# Patient Record
Sex: Male | Born: 1955 | Race: White | Hispanic: No | Marital: Married | Smoking: Current some day smoker
Health system: Southern US, Community
[De-identification: ages and names within clinical notes are randomized; demographics above are authoritative.]

## PROBLEM LIST (undated history)

## (undated) DIAGNOSIS — K4090 Unilateral inguinal hernia, without obstruction or gangrene, not specified as recurrent: Secondary | ICD-10-CM

## (undated) DIAGNOSIS — F329 Major depressive disorder, single episode, unspecified: Secondary | ICD-10-CM

## (undated) DIAGNOSIS — K220 Achalasia of cardia: Secondary | ICD-10-CM

## (undated) DIAGNOSIS — F419 Anxiety disorder, unspecified: Secondary | ICD-10-CM

## (undated) DIAGNOSIS — K353 Acute appendicitis with localized peritonitis, without perforation or gangrene: Secondary | ICD-10-CM

## (undated) DIAGNOSIS — F332 Major depressive disorder, recurrent severe without psychotic features: Secondary | ICD-10-CM

## (undated) DIAGNOSIS — K3532 Acute appendicitis with perforation and localized peritonitis, without abscess: Secondary | ICD-10-CM

## (undated) DIAGNOSIS — F411 Generalized anxiety disorder: Secondary | ICD-10-CM

## (undated) DIAGNOSIS — F32A Depression, unspecified: Secondary | ICD-10-CM

---

## 2000-07-03 ENCOUNTER — Encounter: Payer: Self-pay | Admitting: Emergency Medicine

## 2000-07-03 ENCOUNTER — Emergency Department (HOSPITAL_COMMUNITY): Admission: EM | Admit: 2000-07-03 | Discharge: 2000-07-03 | Payer: Self-pay | Admitting: Emergency Medicine

## 2006-03-16 ENCOUNTER — Ambulatory Visit: Payer: Self-pay | Admitting: Internal Medicine

## 2006-03-29 ENCOUNTER — Encounter (INDEPENDENT_AMBULATORY_CARE_PROVIDER_SITE_OTHER): Payer: Self-pay | Admitting: Specialist

## 2006-03-29 ENCOUNTER — Ambulatory Visit (HOSPITAL_COMMUNITY): Admission: RE | Admit: 2006-03-29 | Discharge: 2006-03-29 | Payer: Self-pay | Admitting: Internal Medicine

## 2006-03-29 ENCOUNTER — Ambulatory Visit: Payer: Self-pay | Admitting: Internal Medicine

## 2006-08-10 ENCOUNTER — Ambulatory Visit (HOSPITAL_COMMUNITY): Admission: RE | Admit: 2006-08-10 | Discharge: 2006-08-10 | Payer: Self-pay | Admitting: General Surgery

## 2006-12-14 ENCOUNTER — Ambulatory Visit (HOSPITAL_COMMUNITY): Admission: RE | Admit: 2006-12-14 | Discharge: 2006-12-14 | Payer: Self-pay | Admitting: General Surgery

## 2007-09-14 ENCOUNTER — Ambulatory Visit (HOSPITAL_COMMUNITY): Admission: RE | Admit: 2007-09-14 | Discharge: 2007-09-14 | Payer: Self-pay | Admitting: Family Medicine

## 2008-04-20 ENCOUNTER — Emergency Department (HOSPITAL_COMMUNITY): Admission: EM | Admit: 2008-04-20 | Discharge: 2008-04-20 | Payer: Self-pay | Admitting: Emergency Medicine

## 2008-04-21 ENCOUNTER — Ambulatory Visit (HOSPITAL_COMMUNITY): Admission: RE | Admit: 2008-04-21 | Discharge: 2008-04-21 | Payer: Self-pay | Admitting: Emergency Medicine

## 2009-08-11 IMAGING — CR DG ABDOMEN ACUTE W/ 1V CHEST
3 series · 3 of 3 positions shown · non-contrast
Comparison: None

CLINICAL DATA: Abdominal pain.  Right upper quadrant pain.

ACUTE ABDOMEN SERIES (ABDOMEN 2 VIEW & CHEST 1 VIEW)

[view not recorded (1 of 3)]
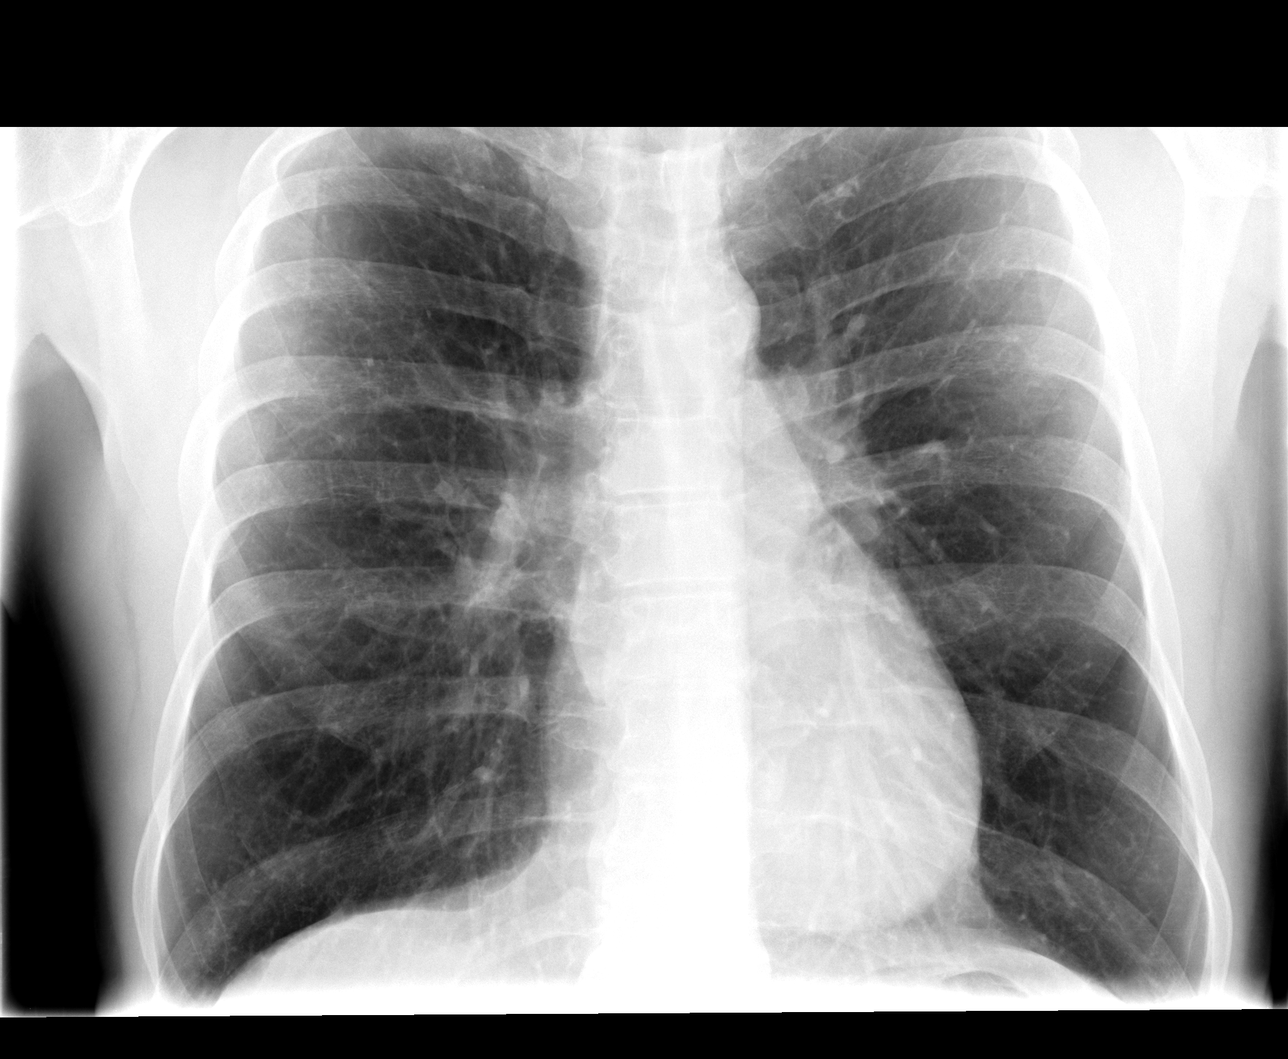

[view not recorded (2 of 3)]
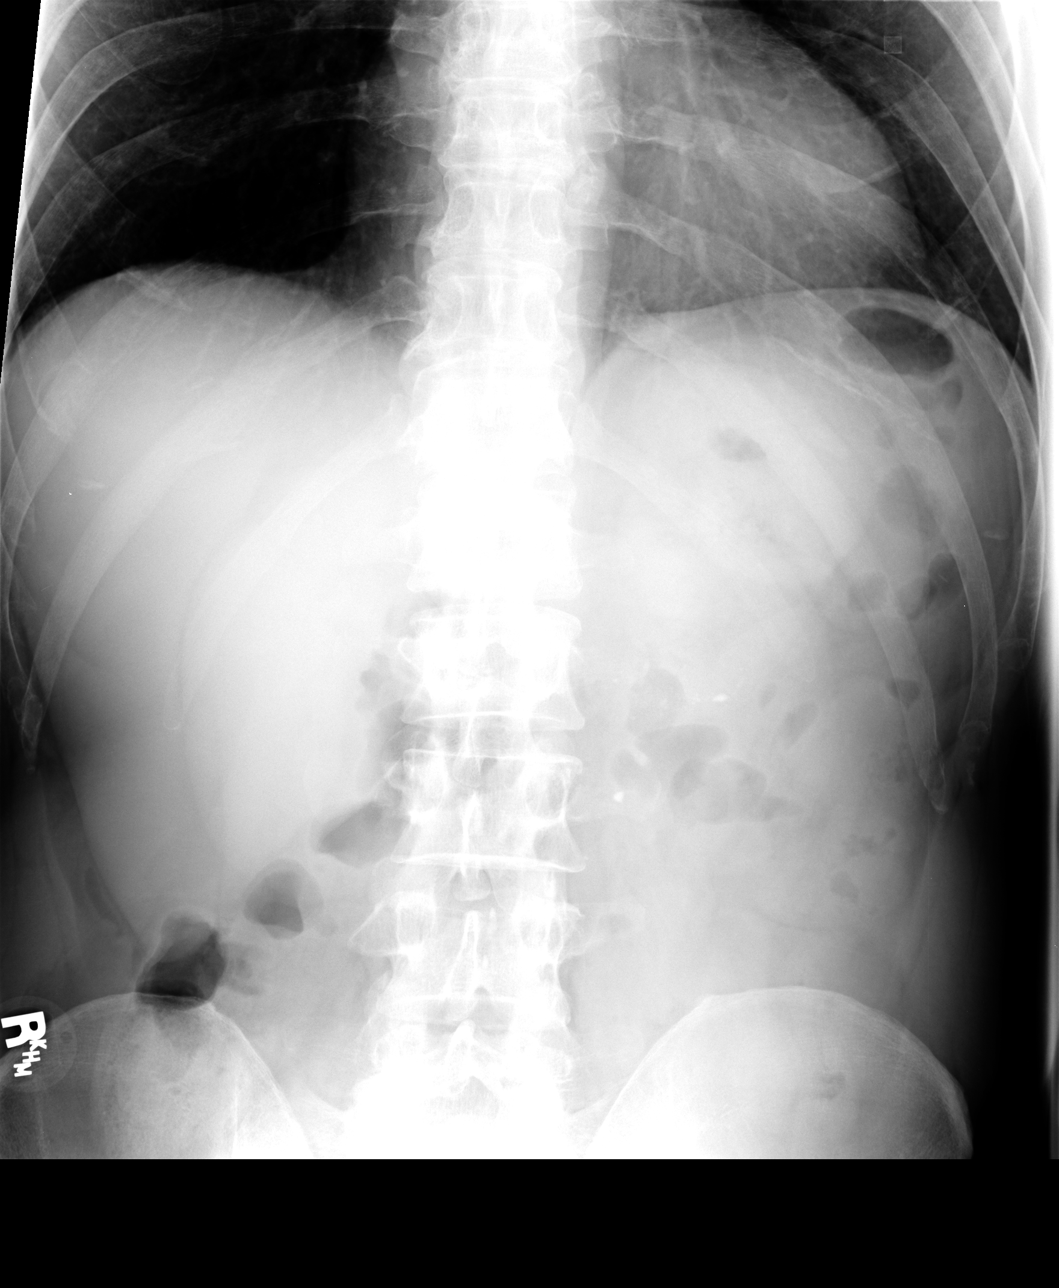

[view not recorded (3 of 3)]
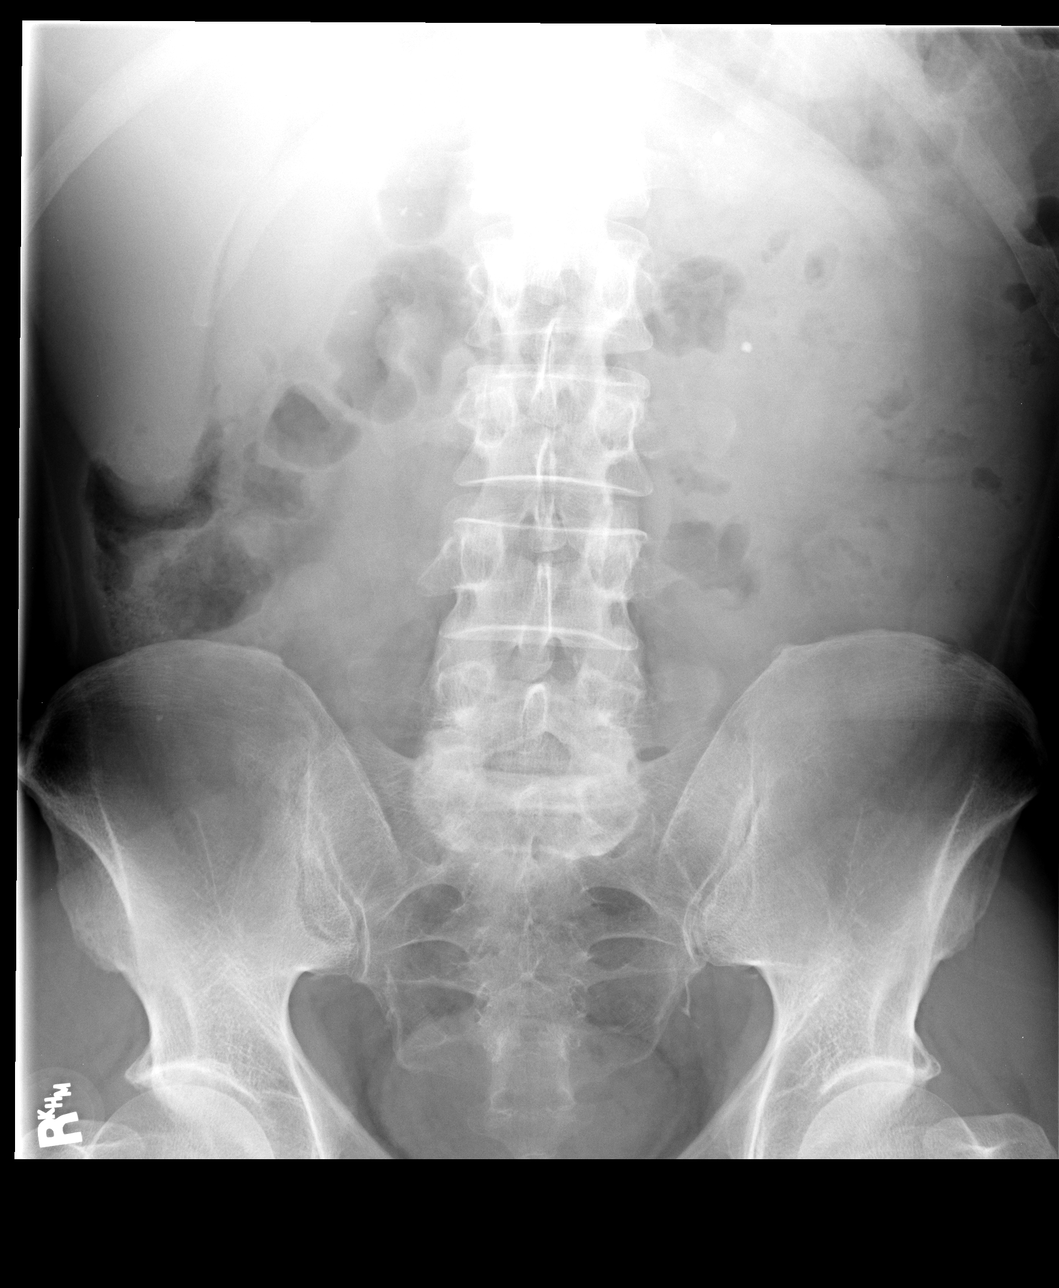

[3 of 3 positions shown; findings below may reference images not displayed]

FINDINGS: Lungs are hyperinflated with increased markings,
compatible with COPD.  There is no acute infiltrate or effusion.
There is no heart failure or mass.

Nonobstructive bowel gas pattern.  There is no free air.  There are
small renal calculi bilaterally.  No acute bony abnormality.
IMPRESSION: COPD, no active cardiopulmonary disease.

Small bilateral renal calculi.

## 2009-09-22 ENCOUNTER — Emergency Department (HOSPITAL_COMMUNITY): Admission: EM | Admit: 2009-09-22 | Discharge: 2009-09-22 | Payer: Self-pay | Admitting: Emergency Medicine

## 2010-04-23 LAB — URINALYSIS, ROUTINE W REFLEX MICROSCOPIC
Bilirubin Urine: NEGATIVE
Glucose, UA: NEGATIVE mg/dL
Hgb urine dipstick: NEGATIVE
Nitrite: NEGATIVE
Protein, ur: NEGATIVE mg/dL
Specific Gravity, Urine: 1.025 (ref 1.005–1.030)
Urobilinogen, UA: 0.2 mg/dL (ref 0.0–1.0)
pH: 6 (ref 5.0–8.0)

## 2010-05-21 LAB — DIFFERENTIAL
Eosinophils Absolute: 0.1 10*3/uL (ref 0.0–0.7)
Eosinophils Relative: 2 % (ref 0–5)
Lymphocytes Relative: 26 % (ref 12–46)
Lymphs Abs: 2.2 10*3/uL (ref 0.7–4.0)
Monocytes Relative: 7 % (ref 3–12)
Neutrophils Relative %: 64 % (ref 43–77)

## 2010-05-21 LAB — CBC
MCHC: 35.3 g/dL (ref 30.0–36.0)
MCV: 90.7 fL (ref 78.0–100.0)
RDW: 13.4 % (ref 11.5–15.5)

## 2010-05-21 LAB — COMPREHENSIVE METABOLIC PANEL
ALT: 28 U/L (ref 0–53)
AST: 24 U/L (ref 0–37)
Calcium: 9.9 mg/dL (ref 8.4–10.5)
Creatinine, Ser: 0.79 mg/dL (ref 0.4–1.5)
GFR calc Af Amer: >60 mL/min (ref >60)
GFR calc non Af Amer: >60 mL/min (ref >60)
Sodium: 137 mEq/L (ref 135–145)
Total Protein: 6.6 g/dL (ref 6.0–8.3)

## 2010-05-21 LAB — LIPASE, BLOOD: Lipase: 24 U/L (ref 11–59)

## 2010-06-23 NOTE — H&P (Signed)
NAMEBRIGHT, SPIELMANN                ACCOUNT NO.:  [id number]   MEDICAL RECORD NO.:  [id number]          PATIENT TYPE:  AMB   LOCATION:  DAY                           FACILITY:  [facility]   PHYSICIAN:  Dalia Heading, M.D.  DATE OF BIRTH:  [birth year]   DATE OF ADMISSION:  DATE OF DISCHARGE:  LH                              HISTORY & PHYSICAL   CHIEF COMPLAINT:  Perirectal abscess.   PRESENT ILLNESS:  The patient is a 55 year old white male who is  referred for evaluation and treatment of perirectal pain and swelling.  It has been present for around 5 days with no drainage.  It has  decreased in size but is still painful.   PAST MEDICAL HISTORY:  1. Unremarkable.   PAST SURGICAL HISTORY:  Vasectomy in the remote past.   CURRENT MEDICATIONS:  Lipitor.   ALLERGIES:  CODEINE.   REVIEW OF SYSTEMS:  Smokes a half a pack of cigarettes a day.  Drinks  alcohol socially.  He denies any other cardiopulmonary difficulties or  bleeding disorders.   PHYSICAL EXAMINATION:  GENERAL:  The patient is a well-developed, well-  nourished white male in no acute distress.  LUNGS:  Clear to auscultation with equal breath sounds bilaterally.  HEART:  Reveals regular rate and rhythm without S3, S4 or murmurs.  RECTAL:  Induration and tenderness noted along the right lateral aspect  of the anus.   IMPRESSION:  Perirectal abscess.   PLAN:  The patient is scheduled for incision and drainage of the  perirectal abscess on August 10, 2006.  The risks and benefits of the  procedure including bleeding, infection, recurrence of the abscess were  fully explained to the patient who gave informed consent.      Dalia Heading, M.D.  Electronically Signed     MAJ/MEDQ  D:  08/09/2006  T:  08/10/2006  Job:  [id number]   cc:   Short Stay at [facility] Milford Cage DO, FAAP  Fax: [phone]

## 2010-06-23 NOTE — Op Note (Signed)
NAMEBULMARO, Mike Moran                ACCOUNT NO.:  [id number]   MEDICAL RECORD NO.:  [id number]          PATIENT TYPE:  AMB   LOCATION:  DAY                           FACILITY:  [facility]   PHYSICIAN:  Dalia Heading, M.D.  DATE OF BIRTH:  07/31/1955   DATE OF PROCEDURE:  12/14/2006  DATE OF DISCHARGE:                               OPERATIVE REPORT   PREOPERATIVE DIAGNOSIS:  Perirectal abscess.   POSTOPERATIVE DIAGNOSIS:  Perirectal abscess.   PROCEDURE:  Incision and drainage of perirectal abscess.   SURGEON:  Dalia Heading, M.D.   ANESTHESIA:  General.   INDICATIONS:  The patient is a 55 year old white male who presents with  a recurrent perirectal abscess.  He had incision and drainage of a  perirectal abscess earlier this year, but now has had a recurrence.  The  recurrence is in a separate area of the anus.  The risks and benefits of  the procedure including bleeding, infection, recurrence of the abscess  were fully explained to the patient, who gave informed consent.   PROCEDURE NOTE:  The patient was placed in the lithotomy position after  general anesthesia was administered.  The perineum was prepped and  draped in the usual sterile technique with Betadine.  Surgical site  confirmation was performed.   An abscess was noted at the anal verge at the 2 o'clock position.  On  rectal examination, this seemed to extend towards the dentate line  directly.  An incision was made at the anal verge and purulent fluid was  found.  Both anaerobic and aerobic cultures were taken and sent to  microbiology for further examination.  The abscess cavity was evacuated  and a probe was then used.  The tract seemed to be submucosal in nature,  it did not seem to involve the external sphincter mechanism.  No opening  could be found at the dentate line.  The abscess cavity was curetted of  any necrotic tissue.  Iodoform Nugauze packing was then placed into the  abscess cavity.  0.5% Sensorcaine  was instilled in the surrounding  wound.  All tape and needle counts were correct at the end of the  procedure.  The patient was awakened and transferred to the PACU in  stable condition.   COMPLICATIONS:  None.   SPECIMEN:  Cultures of the abscess cavity, perirectal.   ESTIMATED BLOOD LOSS:  Minimal.      Dalia Heading, M.D.  Electronically Signed     MAJ/MEDQ  D:  12/14/2006  T:  12/14/2006  Job:  [id number]

## 2010-06-23 NOTE — Op Note (Signed)
Mike Moran, Mike Moran                ACCOUNT NO.:  [id number]   MEDICAL RECORD NO.:  [id number]          PATIENT TYPE:  AMB   LOCATION:  DAY                           FACILITY:  [facility]   PHYSICIAN:  Dalia Heading, M.D.  DATE OF BIRTH:  1955-09-22   DATE OF PROCEDURE:  08/10/2006  DATE OF DISCHARGE:                               OPERATIVE REPORT   PREOPERATIVE DIAGNOSIS:  Perirectal abscess.   POSTOPERATIVE DIAGNOSIS:  Perirectal abscess.   PROCEDURE:  Incision and drainage of perirectal abscess.   SURGEON:  Dalia Heading, M.D.   ANESTHESIA:  General.   INDICATIONS:  The patient is a 55 year old white male who presents with  an indurated, tender area at the 10 o'clock position around the anus.  The patient comes to the operating room for incision and drainage of the  perirectal abscess.  The risks and benefits of the procedure including  bleeding, infection, recurrence of the abscess, were fully explained to  the patient who gave informed consent.   PROCEDURE NOTE:  The patient was placed in the lithotomy position after  general anesthesia was administered.  The perineum was prepped and  draped in the usual sterile technique with Betadine.  Surgical site  confirmation was performed.   An incision was made at the 10 o'clock position over the indurated area.  Necrotic tissue was found.  This was probed and a tunnel towards the  dentate line was found.  This was submuscularis in nature.  There was no  direct opening into the rectum.  The wound was curetted.  Any bleeding  was controlled using Bovie electrocautery.  Care was taken to avoid the  external sphincter muscle.  The incision was closed loosely using 2-0  chromic gut sutures.  0.5% Sensorcaine was instilled in the surrounding wound.  A dry, sterile  dressing was then applied.  All tape and needle counts were correct at  the end of the procedure.  The patient was awakened and transferred to  the PACU in stable condition.   Complications none.  Specimens none.  Blood loss minimal.      Dalia Heading, M.D.  Electronically Signed     MAJ/MEDQ  D:  08/10/2006  T:  08/10/2006  Job:  [id number]   cc:   Francoise Schaumann. Milford Cage DO, FAAP  Fax: [phone]

## 2010-06-23 NOTE — H&P (Signed)
NAMEJAMAURI, Mike Moran                ACCOUNT NO.:  [id number]   MEDICAL RECORD NO.:  [id number]          PATIENT TYPE:  AMB   LOCATION:  DAY                           FACILITY:  [facility]   PHYSICIAN:  Dalia Heading, M.D.  DATE OF BIRTH:  05-23-1955   DATE OF ADMISSION:  12/14/2006  DATE OF DISCHARGE:  LH                              HISTORY & PHYSICAL   CHIEF COMPLAINT:  Perirectal abscess.   HISTORY OF PRESENT ILLNESS:  The patient is a 55 year old white male who  was referred for evaluation and treatment of a perirectal abscess.  It  started several days ago.  It is not in the same location as a  previously drained perirectal abscess earlier this year.  No fevers have  been noted.  No drainage has been noted.   PAST MEDICAL HISTORY:  Unremarkable.   PAST SURGICAL HISTORY:  1. I&D of perirectal abscess in July of 2008.  2. Vasectomy.   CURRENT MEDICATIONS:  Lipitor.   ALLERGIES:  CODEINE.   REVIEW OF SYSTEMS:  The patient smokes a half pack of cigarettes a day.  He drinks alcohol socially.  Denies any other current pulmonary  difficulties or bleeding disorders.   PHYSICAL EXAMINATION:  GENERAL:  The patient is a well-developed, well-  nourished white male in no acute distress.  LUNGS:  Clear to auscultation with equal breath sounds bilaterally.  HEART:  Regular rate and rhythm without S3, S4 or murmurs.  RECTAL:  A fluctuant perianal abscess at the 2 o'clock position.  No  drainage is noted.  The previously drained abscess on the opposite side  has healed well.   IMPRESSION:  Perirectal abscess.   PLAN:  The patient is scheduled for incision and drainage of the  perirectal abscess on December 14, 2006.  The risks and benefits of the  procedure including bleeding, infection, recurrence of the abscess were  fully explained to the patient.  He gave informed consent.      Dalia Heading, M.D.  Electronically Signed     MAJ/MEDQ  D:  12/13/2006  T:  12/14/2006  Job:   [id number]   cc:   Francoise Schaumann. Milford Cage DO, FAAP  Fax: [phone]

## 2010-06-26 NOTE — Op Note (Signed)
NAMEJONH, Mike Moran                ACCOUNT NO.:  [id number]   MEDICAL RECORD NO.:  [id number]          PATIENT TYPE:  AMB   LOCATION:  DAY                           FACILITY:  [facility]   PHYSICIAN:  Lionel December, M.D.    DATE OF BIRTH:  07/21/1955   DATE OF PROCEDURE:  03/29/2006  DATE OF DISCHARGE:                               OPERATIVE REPORT   PROCEDURE:  Esophagogastroduodenoscopy with esophageal dilation followed  by colonoscopy.   INDICATIONS:  Mike Moran is 55 year old Caucasian male who presents with  solid food dysphagia, intermittent heartburn.  He is undergoing  diagnostic EGD followed by average risk screening colonoscopy.  Procedure risks were reviewed the patient, informed consent was  obtained.   MEDS FOR CONSCIOUS SEDATION:  Benzocaine spray for pharyngeal topical  anesthesia, Demerol 50 mg IV, Versed 12 mg IV.   FINDINGS:  Procedure performed in endoscopy suite.  The patient's vital  signs and O2 sat monitored during procedure and remained stable.   PROCEDURE:  1. Esophagogastroduodenoscopy.  The patient was placed left lateral      position and Pentax videoscope was passed via oropharynx without      any difficulty into esophagus.   Esophagus.  Mucosa of the esophagus normal.  Ring was noted at GE  junction which was felt to be inconspicuous.  GE junction was at 43 cm  from the incisors and no hernia was noted.  There were no mucosal  changes noted to suggest eosinophilic esophagitis.   Stomach was empty and distended very well insufflation.  Folds proximal  stomach were normal.  Examination mucosa at body, antrum, pyloric  channel as well as angularis, fundus and cardia was normal.   Duodenum.  Bulbar mucosa was normal.  Scope was passed second part of  duodenum where mucosa and folds were normal.  Endoscope was withdrawn.   Esophagus was dilated by passing 54-French Maloney dilator to full  insertion.  Was passed without any resistance.  Endoscope was  passed  again and there was small mucosal tear noted at GE junction.  Subsequently a size 56-French Maloney dilator was also passed to full  insertion and esophagus reexamined and there were no other areas with  mucosal disruption noted.  Pictures taken for the record.  Endoscope was  withdrawn and the patient prepared for procedure #2.   Colonoscopy.  Rectal examination performed.  No abnormality noted  external or digital exam.  Pentax videoscope was placed rectum and  advanced under vision into sigmoid colon beyond.  Preparation was  satisfactory.  Scope was passed into cecum which was identified by  appendiceal orifice, ileocecal valve.  There was a small polyp on a fold  across from the ileocecal valve which was ablated via cold biopsy.  As  the scope was withdrawn.  The colonic mucosa was carefully examined and  there was another small polyp at midtransverse colon which was ablated  via cold biopsy.  Mucosa rest of the colon was normal.  Rectal mucosa  similarly was normal.  Scope was retroflexed to examine anorectal  junction and small hemorrhoids noted below  and above the dentate line.  Pictures taken for the record and endoscope was withdrawn.  The patient  tolerated the procedure well.   FINAL DIAGNOSIS:  Ring at esophagogastric junction which was disrupted  by passing 54 and 56-French Maloney dilators.  No evidence of erosive  esophagitis, normal exam of the stomach, first second part of duodenum.  Two small polyps ablated via cold biopsy.  One from cecum, another one  from transverse colon.  Small internal/external hemorrhoids.   RECOMMENDATIONS:  Standard instructions given.  Antireflux measures.  He  can use Prilosec on a p.r.n. basis.   I will be contacting patient, results of biopsy and further  recommendations.      Lionel December, M.D.  Electronically Signed     NR/MEDQ  D:  03/29/2006  T:  03/29/2006  Job:  [id number]   cc:   Francoise Schaumann. Milford Cage DO, FAAP  Fax:  [phone]

## 2010-06-26 NOTE — H&P (Signed)
NAMERONON, FERGER                ACCOUNT NO.:  [id number]   MEDICAL RECORD NO.:  [id number]          PATIENT TYPE:  AMB   LOCATION:                                FACILITY:  [facility]   PHYSICIAN:  Lionel December, M.D.    DATE OF BIRTH:  October 11, 1955   DATE OF ADMISSION:  03/16/2006  DATE OF DISCHARGE:  LH                              HISTORY & PHYSICAL   PRESENTING COMPLAINT:  Dysphagia and heartburn.   HISTORY OF PRESENT ILLNESS:  Mike Moran is a 55 year old Caucasian male  patient of Dr. Acey Lav who is self-referred for evaluation of solid  food dysphagia that he has had intermittently over the last 4-5 years.  Lately, he has been having these spells more often.  He points to mid  sternal area at the site of bolus obstruction.  He has been reluctant to  undergo evaluation until now.  He is accompanied by his wife who states  he has had a few syncopal episodes with these spells.  He had one last  year.  He fell and hit his head, likely, he has not had any chronic  headache.  Last episode occurred about 3-4 months ago.  Every time he  has food bolus obstruction, it eventually passed a stone.  He also  complains of frequent heartburn, not experienced every day.  Occasionally, he has intractable heartburn.  He uses OTC antacids which  helped.  He denies hoarseness, sore throat or chronic cough.  He has  good appetite and his weight has been stable.  His bowels move  regularly.  He denies melena or rectal bleeding.  The patient admits to  eating too fast and not chewing his food well.  He does recall that his  wife did Heimlich maneuver once with relief of his dysphagia.   MEDICATIONS:  1. ASA 325 mg daily.  2. MVI daily.  3. Vitamin C daily.   PAST MEDICAL HISTORY:  1. Hyperlipidemia.  He says at one point he had a cholesterol of over      300.  He was treated with Lipitor, but he developed muscle pain and      stopped therapy.  He does not remember his last cholesterol or  HDL/LDL levels.  2. History of right flank pain felt to be musculoskeletal.  3. He has had two scans, and he has been evaluated by Dr. Lovell Sheehan.      Last scan was in January 2008.  4. He had excision of right wrist ganglion about 10 years ago.  5. Vasectomy prior to that in 1980.   ALLERGIES:  CODEINE CAUSING NAUSEA AND HE DEVELOPED MUSCLE PAIN WITH  LIPITOR.   FAMILY HISTORY:  Mother was treated for breast carcinoma at age 19 and  10 years later she is doing fine.  Father is diabetic, treated for  prostate cancer 2 years ago at age 36 and doing fine at age 76.  The  patient has a sister and a brother in good health.   SOCIAL HISTORY:  He is married.  He has two children and one stepchild  all in good health.  He has been working with [employer] for the past 20 years.  He drinks alcohol no more than 5-6  times a year.  He smokes half a pack a day which he has done for the  last 25 years.   PHYSICAL EXAMINATION:  GENERAL:  Pleasant, well-developed, well-  nourished Caucasian male who is in no acute distress.  VITAL SIGNS:  He weighs 178 pounds.  He is 6 feet tall.  Pulse 68 per  minute, blood pressure 118/88, temperature is 98.9.  HEENT:  Conjunctivae is pink.  Sclerae is nonicteric.  Oropharynx mucosa  is normal.  NECK:  No masses or thyromegaly noted.  CARDIAC:  Regular rhythm.  Normal S1-S3.  No murmur or gallop noted.  LUNGS:  Clear to auscultation.  ABDOMEN:  Flat, soft and nontender without organomegaly or masses.  RECTAL:  Examination deferred.  No peripheral edema or clubbing noted.   ASSESSMENT:  1. Dysphagia and intermittent heartburn.  I suspect he has a      Schatzki's ring given his history of intermittent solid food      dysphagia for over 4 years.  He could also have chronic GERD with      stricture.  I doubt esophageal motility disorder or other      etiologies to account for his dysphagia.   1. He is average risk for colorectal  carcinoma.  He is interested in      screening.   RECOMMENDATIONS:  1. Antireflux measures.  2. Omeprazole 20 mg p.o. q.a.m. prescription given for 30 with 11      refills.  3. He will undergo esophagogastroduodenoscopy with esophageal dilation      and screening colonoscopy at [facility] within the next 2-      3 weeks.      Lionel December, M.D.  Electronically Signed     NR/MEDQ  D:  03/16/2006  T:  03/17/2006  Job:  [id number]   cc:   Francoise Schaumann. Milford Cage DO, FAAP  Fax: [phone]

## 2010-11-17 LAB — CULTURE, ROUTINE-ABSCESS

## 2010-11-17 LAB — CBC
HCT: 37.3 — ABNORMAL LOW
MCHC: 34.9
MCV: 90.6
RBC: 4.12 — ABNORMAL LOW
WBC: 11.4 — ABNORMAL HIGH

## 2010-11-17 LAB — BASIC METABOLIC PANEL
CO2: 28
Chloride: 106
Creatinine, Ser: 0.77
GFR calc Af Amer: >60
Potassium: 4.4

## 2010-11-17 LAB — ANAEROBIC CULTURE

## 2010-11-24 LAB — BASIC METABOLIC PANEL
BUN: 13
CO2: 30
Calcium: 10.3
Chloride: 106
Creatinine, Ser: 0.87
GFR calc Af Amer: >60
Glucose, Bld: 101 — ABNORMAL HIGH

## 2010-11-24 LAB — CBC
MCHC: 34.9
MCV: 90.2
RBC: 4.45
RDW: 13.1

## 2013-03-13 ENCOUNTER — Encounter (INDEPENDENT_AMBULATORY_CARE_PROVIDER_SITE_OTHER): Payer: Self-pay | Admitting: *Deleted

## 2013-11-28 ENCOUNTER — Emergency Department (HOSPITAL_COMMUNITY): Payer: 59

## 2013-11-28 ENCOUNTER — Emergency Department (HOSPITAL_COMMUNITY)
Admission: EM | Admit: 2013-11-28 | Discharge: 2013-11-28 | Disposition: A | Discharge status: Home / Self Care | Payer: 59 | Attending: Emergency Medicine | Admitting: Emergency Medicine

## 2013-11-28 ENCOUNTER — Encounter (HOSPITAL_COMMUNITY): Payer: Self-pay | Admitting: Emergency Medicine

## 2013-11-28 DIAGNOSIS — R110 Nausea: Secondary | ICD-10-CM | POA: Insufficient documentation

## 2013-11-28 DIAGNOSIS — Z87891 Personal history of nicotine dependence: Secondary | ICD-10-CM | POA: Insufficient documentation

## 2013-11-28 DIAGNOSIS — R51 Headache: Secondary | ICD-10-CM | POA: Diagnosis not present

## 2013-11-28 DIAGNOSIS — R4182 Altered mental status, unspecified: Secondary | ICD-10-CM | POA: Insufficient documentation

## 2013-11-28 DIAGNOSIS — R4020 Unspecified coma: Secondary | ICD-10-CM | POA: Diagnosis present

## 2013-11-28 DIAGNOSIS — Z79899 Other long term (current) drug therapy: Secondary | ICD-10-CM | POA: Insufficient documentation

## 2013-11-28 HISTORY — DX: Major depressive disorder, single episode, unspecified: F32.9

## 2013-11-28 HISTORY — DX: Depression, unspecified: F32.A

## 2013-11-28 LAB — RAPID URINE DRUG SCREEN, HOSP PERFORMED
Amphetamines: NOT DETECTED
BARBITURATES: NOT DETECTED
BENZODIAZEPINES: NOT DETECTED
COCAINE: NOT DETECTED
OPIATES: NOT DETECTED
Tetrahydrocannabinol: POSITIVE — AB

## 2013-11-28 LAB — CBC WITH DIFFERENTIAL/PLATELET
BASOS ABS: 0 10*3/uL (ref 0.0–0.1)
BASOS PCT: 1 % (ref 0–1)
EOS ABS: 0.1 10*3/uL (ref 0.0–0.7)
EOS PCT: 1 % (ref 0–5)
HEMATOCRIT: 39 % (ref 39.0–52.0)
Hemoglobin: 13.4 g/dL (ref 13.0–17.0)
Lymphocytes Relative: 14 % (ref 12–46)
Lymphs Abs: 1.2 10*3/uL (ref 0.7–4.0)
MCH: 31.2 pg (ref 26.0–34.0)
MCHC: 34.4 g/dL (ref 30.0–36.0)
MCV: 90.9 fL (ref 78.0–100.0)
MONO ABS: 0.5 10*3/uL (ref 0.1–1.0)
MONOS PCT: 6 % (ref 3–12)
NEUTROS ABS: 6.6 10*3/uL (ref 1.7–7.7)
Neutrophils Relative %: 78 % — ABNORMAL HIGH (ref 43–77)
Platelets: 336 10*3/uL (ref 150–400)
RBC: 4.29 MIL/uL (ref 4.22–5.81)
RDW: 13.2 % (ref 11.5–15.5)
WBC: 8.5 10*3/uL (ref 4.0–10.5)

## 2013-11-28 LAB — COMPREHENSIVE METABOLIC PANEL
ALBUMIN: 4.1 g/dL (ref 3.5–5.2)
ALT: 15 U/L (ref 0–53)
ANION GAP: 11 (ref 5–15)
AST: 17 U/L (ref 0–37)
Alkaline Phosphatase: 45 U/L (ref 39–117)
BUN: 13 mg/dL (ref 6–23)
CALCIUM: 9.5 mg/dL (ref 8.4–10.5)
CHLORIDE: 102 meq/L (ref 96–112)
CO2: 27 mEq/L (ref 19–32)
CREATININE: 0.73 mg/dL (ref 0.50–1.35)
GFR calc Af Amer: >90 mL/min (ref >90)
GFR calc non Af Amer: >90 mL/min (ref >90)
Glucose, Bld: 101 mg/dL — ABNORMAL HIGH (ref 70–99)
Potassium: 3.9 mEq/L (ref 3.7–5.3)
Sodium: 140 mEq/L (ref 137–147)
TOTAL PROTEIN: 7.2 g/dL (ref 6.0–8.3)
Total Bilirubin: 0.4 mg/dL (ref 0.3–1.2)

## 2013-11-28 LAB — URINALYSIS, ROUTINE W REFLEX MICROSCOPIC
BILIRUBIN URINE: NEGATIVE
Glucose, UA: NEGATIVE mg/dL
HGB URINE DIPSTICK: NEGATIVE
Ketones, ur: NEGATIVE mg/dL
Leukocytes, UA: NEGATIVE
NITRITE: NEGATIVE
PH: 7.5 (ref 5.0–8.0)
Protein, ur: NEGATIVE mg/dL
SPECIFIC GRAVITY, URINE: 1.01 (ref 1.005–1.030)
Urobilinogen, UA: 0.2 mg/dL (ref 0.0–1.0)

## 2013-11-28 MED ORDER — ONDANSETRON HCL 4 MG PO TABS: 4.0000 mg | ORAL_TABLET | Freq: Four times a day (QID) | ORAL | Status: DC

## 2013-11-28 NOTE — ED Provider Notes (Signed)
CSN: [id number]     Arrival date & time 11/28/13  1253 History  This chart was scribed for Gilda Creasehristopher J. Zenith Lamphier, MD by Littie Deedsichard Sun, ED Scribe. This patient was seen in room APA02/APA02 and the patient's care was started at 1:24 PM.       Chief Complaint  Patient presents with  . decreased loc      The history is provided by the patient and the spouse. No language interpreter was used.   124 HPI Comments: Mike Moran is a 58 y.o. male brought in by EMS who presents to the Emergency Department complaining of decreased LOC that started earlier today. Patient states that he was not feeling well and nauseated when he woke up this morning. Patient called his spouse from work around 12:00pm today, and his wife told EMS that he sounded confused. He went to his mother's house afterwards and became unresponsive with his family. Upon EMS arrival, he was still unresponsive to verbal stimuli and did not move with IV stick. However, patient is now more responsive now. He notes having discomfort around his head; he feels a "band" of discomfort around his head. He also notes feeling tired, wanting to sleep. Per spouse, patient was on Wellbutrin within the past few months and quit smoking 1 month ago. His spouse has attributed his recent smoking cessation to his depression. Patient was added on Lexapro and Ambien 2 days ago. He has taken Ambien before with Wellbutin and did okay on it.   Past Medical History  Diagnosis Date  . Depression    History reviewed. No pertinent past surgical history. No family history on file. History  Substance Use Topics  . Smoking status: Former Games developermoker  . Smokeless tobacco: Not on file  . Alcohol Use: Yes     Comment: occ    Review of Systems  Constitutional: Positive for fatigue.  Gastrointestinal: Positive for nausea.  Neurological: Positive for headaches.  Psychiatric/Behavioral: Positive for confusion.  All other systems reviewed and are  negative.     Allergies  Codeine  Home Medications   Prior to Admission medications   Medication Sig Start Date End Date Taking? Authorizing Provider  bismuth subsalicylate (PEPTO BISMOL) 262 MG/15ML suspension Take 30 mLs by mouth every 6 (six) hours as needed for indigestion.   Yes Historical Provider, MD  buPROPion (WELLBUTRIN XL) 150 MG 24 hr tablet Take 1 tablet by mouth daily. 11/06/13  Yes Historical Provider, MD  escitalopram (LEXAPRO) 10 MG tablet Take 1 tablet by mouth daily. 11/26/13  Yes Historical Provider, MD  zolpidem (AMBIEN) 10 MG tablet Take 1 tablet by mouth at bedtime. 11/26/13  Yes Historical Provider, MD   BP 130/77  Pulse 63  Temp(Src) 97.9 F (36.6 C) (Oral)  Resp 18  Ht 6' (1.829 m)  Wt 153 lb (69.4 kg)  BMI 20.75 kg/m2  SpO2 99% Physical Exam  Nursing note and vitals reviewed. Constitutional: He is oriented to person, place, and time. He appears well-developed and well-nourished. No distress.  HENT:  Head: Normocephalic and atraumatic.  Right Ear: Hearing normal.  Left Ear: Hearing normal.  Nose: Nose normal.  Mouth/Throat: Oropharynx is clear and moist and mucous membranes are normal.  Eyes: Conjunctivae and EOM are normal. Pupils are equal, round, and reactive to light.  Neck: Normal range of motion. Neck supple.  Cardiovascular: Regular rhythm, S1 normal and S2 normal.  Exam reveals no gallop and no friction rub.   No murmur heard. Pulmonary/Chest: Effort normal  and breath sounds normal. No respiratory distress. He exhibits no tenderness.  Abdominal: Soft. Normal appearance and bowel sounds are normal. There is no hepatosplenomegaly. There is no tenderness. There is no rebound, no guarding, no tenderness at McBurney's point and negative Murphy's sign. No hernia.  Musculoskeletal: Normal range of motion.  Neurological: He is alert and oriented to person, place, and time. He has normal strength. No cranial nerve deficit or sensory deficit.  Coordination normal. GCS eye subscore is 4. GCS verbal subscore is 5. GCS motor subscore is 6.  Extraocular muscle movement: normal No visual field cut Pupils: equal and reactive both direct and consensual response is normal No nystagmus present    Sensory function is intact to light touch, pinprick Proprioception intact  Grip strength 5/5 symmetric in upper extremities Lower extremity strength 5/5 against gravity No pronator drift Normal finger to nose bilaterally Normal heel to shin bilaterally     Skin: Skin is warm, dry and intact. No rash noted. No cyanosis.  Psychiatric: He has a normal mood and affect. His speech is normal and behavior is normal. Thought content normal.    ED Course  Procedures  DIAGNOSTIC STUDIES: Oxygen Saturation is 100% on RA, nml by my interpretation.    COORDINATION OF CARE: 1:28 PM-Discussed treatment plan which includes imaging and labs with pt at bedside and pt agreed to plan.   Labs Review Labs Reviewed  URINE RAPID DRUG SCREEN (HOSP PERFORMED) - Abnormal; Notable for the following:    Tetrahydrocannabinol POSITIVE (*)    All other components within normal limits  CBC WITH DIFFERENTIAL - Abnormal; Notable for the following:    Neutrophils Relative % 78 (*)    All other components within normal limits  COMPREHENSIVE METABOLIC PANEL - Abnormal; Notable for the following:    Glucose, Bld 101 (*)    All other components within normal limits  URINALYSIS, ROUTINE W REFLEX MICROSCOPIC    Imaging Review Dg Chest 1 View  11/28/2013   CLINICAL DATA:  Altered mental status  EXAM: CHEST - 1 VIEW  COMPARISON:  10/27/2003  FINDINGS: The heart size and mediastinal contours are within normal limits. Both lungs are clear. The visualized skeletal structures are unremarkable.  IMPRESSION: No active disease.   Electronically Signed   By: Marlan Palauharles  Clark M.D.   On: 11/28/2013 14:27   Ct Head Wo Contrast  11/28/2013   CLINICAL DATA:  Mental status change   EXAM: CT HEAD WITHOUT CONTRAST  TECHNIQUE: Contiguous axial images were obtained from the base of the skull through the vertex without intravenous contrast.  COMPARISON:  MRI 09/14/2007  FINDINGS: Ventricle size is normal. Negative for acute or chronic infarction. Negative for hemorrhage or fluid collection. Negative for mass or edema. No shift of the midline structures.  Calvarium is intact. Mucosal thickening in the ethmoid sinuses left greater than right.  IMPRESSION: No significant intracranial abnormality  Chronic sinusitis.   Electronically Signed   By: Marlan Palauharles  Clark M.D.   On: 11/28/2013 14:22     EKG Interpretation   Date/Time:  Wednesday November 28 2013 13:01:01 EDT Ventricular Rate:  53 PR Interval:  188 QRS Duration: 95 QT Interval:  410 QTC Calculation: 385 R Axis:   75 Text Interpretation:  Sinus rhythm Minimal ST elevation, anterior leads No  significant change since last tracing Confirmed by Deliliah Spranger  MD,  Nicholes Hibler [phone]) on 11/28/2013 1:30:23 PM      MDM   Final diagnoses:  Mental status change  She presented to the ER for evaluation of mental status change. Patient just started Lexapro and Ambien in addition to his Wellbutrin. Patient was feeling nauseated and weak and at this morning, went to work and became worse. He had become because of severe weakness and then became confused. Patient's sleep is difficult to arouse. Upon arrival to the ER he is sleepy but awake, alert and oriented. He is continued to progressively improve. The ER. His workup was entirely unremarkable. He had no focal neurologic findings on examination. Symptoms are likely secondary to adding the Lexapro and Ambien, he'll stop these medications and to talk to his primary Doctor.  I personally performed the services described in this documentation, which was scribed in my presence. The recorded information has been reviewed and is accurate.      Gilda Crease, MD 11/28/13 1505

## 2013-11-28 NOTE — ED Notes (Signed)
MD at bedside. 

## 2013-11-28 NOTE — Discharge Instructions (Signed)

## 2013-11-28 NOTE — ED Notes (Signed)
Pt in xray

## 2013-11-28 NOTE — ED Notes (Addendum)
EMS reports pt wasn't feeling well, was nauseated.  Ate breakfast then went to work.  Pt called his wife from work around 1200 and wife told ems he sounded confused.  Pt left work for lunch and went to his mothers house.  Reports pt fell asleep at mothers house and became unresponsive with family.  Upon ems arrival, pt was still unresponsive to verbal stimuli, didn't move with IV stick.  EMS administered 1mg  of narcan 15 min ago.  Pt stayed unsresponsive with ems but is more responsive at this time.  Pt talking, answering some questions.    Pt started lexapro 2 days ago.  Wife reports pt recently stopped smoking 1 month ago.  Wife reports pt started taking welbutrin within the last months, lexapro and ambien in the past 2 days.

## 2013-11-28 NOTE — ED Notes (Signed)
cbg 118 per ems

## 2014-01-09 ENCOUNTER — Encounter (HOSPITAL_COMMUNITY): Payer: Self-pay | Admitting: *Deleted

## 2014-01-09 ENCOUNTER — Emergency Department (HOSPITAL_COMMUNITY): Payer: 59

## 2014-01-09 ENCOUNTER — Inpatient Hospital Stay (HOSPITAL_COMMUNITY)
Admission: EM | Admit: 2014-01-09 | Discharge: 2014-01-14 | DRG: 885 | Disposition: A | Discharge status: Home / Self Care | Payer: 59 | Source: Intra-hospital | Attending: Psychiatry | Admitting: Psychiatry

## 2014-01-09 ENCOUNTER — Emergency Department (HOSPITAL_COMMUNITY)
Admission: EM | Admit: 2014-01-09 | Discharge: 2014-01-09 | Disposition: A | Discharge status: Other Acute Inpatient Hospital | Payer: 59 | Attending: Emergency Medicine | Admitting: Emergency Medicine

## 2014-01-09 DIAGNOSIS — Z79899 Other long term (current) drug therapy: Secondary | ICD-10-CM

## 2014-01-09 DIAGNOSIS — Z87891 Personal history of nicotine dependence: Secondary | ICD-10-CM

## 2014-01-09 DIAGNOSIS — Z046 Encounter for general psychiatric examination, requested by authority: Secondary | ICD-10-CM | POA: Diagnosis present

## 2014-01-09 DIAGNOSIS — R45851 Suicidal ideations: Secondary | ICD-10-CM | POA: Diagnosis present

## 2014-01-09 DIAGNOSIS — F411 Generalized anxiety disorder: Secondary | ICD-10-CM | POA: Diagnosis present

## 2014-01-09 DIAGNOSIS — F329 Major depressive disorder, single episode, unspecified: Secondary | ICD-10-CM | POA: Insufficient documentation

## 2014-01-09 DIAGNOSIS — F419 Anxiety disorder, unspecified: Secondary | ICD-10-CM | POA: Diagnosis not present

## 2014-01-09 DIAGNOSIS — F322 Major depressive disorder, single episode, severe without psychotic features: Principal | ICD-10-CM | POA: Diagnosis present

## 2014-01-09 DIAGNOSIS — Z7982 Long term (current) use of aspirin: Secondary | ICD-10-CM | POA: Diagnosis not present

## 2014-01-09 DIAGNOSIS — F32A Depression, unspecified: Secondary | ICD-10-CM

## 2014-01-09 LAB — COMPREHENSIVE METABOLIC PANEL
ALK PHOS: 67 U/L (ref 39–117)
ALT: 13 U/L (ref 0–53)
AST: 17 U/L (ref 0–37)
Albumin: 3.9 g/dL (ref 3.5–5.2)
Anion gap: 12 (ref 5–15)
BUN: 13 mg/dL (ref 6–23)
CO2: 26 mEq/L (ref 19–32)
Calcium: 9.8 mg/dL (ref 8.4–10.5)
Chloride: 100 mEq/L (ref 96–112)
Creatinine, Ser: 0.68 mg/dL (ref 0.50–1.35)
GFR calc non Af Amer: >90 mL/min (ref >90)
GLUCOSE: 113 mg/dL — AB (ref 70–99)
POTASSIUM: 4.5 meq/L (ref 3.7–5.3)
SODIUM: 138 meq/L (ref 137–147)
TOTAL PROTEIN: 7.4 g/dL (ref 6.0–8.3)
Total Bilirubin: 0.4 mg/dL (ref 0.3–1.2)

## 2014-01-09 LAB — RAPID URINE DRUG SCREEN, HOSP PERFORMED
AMPHETAMINES: NOT DETECTED
Barbiturates: NOT DETECTED
Benzodiazepines: NOT DETECTED
COCAINE: NOT DETECTED
OPIATES: NOT DETECTED
Tetrahydrocannabinol: POSITIVE — AB

## 2014-01-09 LAB — URINALYSIS, ROUTINE W REFLEX MICROSCOPIC
Bilirubin Urine: NEGATIVE
GLUCOSE, UA: NEGATIVE mg/dL
Hgb urine dipstick: NEGATIVE
Ketones, ur: NEGATIVE mg/dL
Leukocytes, UA: NEGATIVE
Nitrite: NEGATIVE
Protein, ur: NEGATIVE mg/dL
SPECIFIC GRAVITY, URINE: 1.025 (ref 1.005–1.030)
Urobilinogen, UA: 0.2 mg/dL (ref 0.0–1.0)
pH: 6.5 (ref 5.0–8.0)

## 2014-01-09 LAB — SALICYLATE LEVEL: Salicylate Lvl: 1 mg/dL — ABNORMAL LOW (ref 2.8–20.0)

## 2014-01-09 LAB — CBC WITH DIFFERENTIAL/PLATELET
Basophils Absolute: 0.1 10*3/uL (ref 0.0–0.1)
Basophils Relative: 0 % (ref 0–1)
EOS ABS: 0.1 10*3/uL (ref 0.0–0.7)
Eosinophils Relative: 1 % (ref 0–5)
HCT: 40.8 % (ref 39.0–52.0)
Hemoglobin: 14.1 g/dL (ref 13.0–17.0)
LYMPHS ABS: 1.4 10*3/uL (ref 0.7–4.0)
LYMPHS PCT: 12 % (ref 12–46)
MCH: 31.5 pg (ref 26.0–34.0)
MCHC: 34.6 g/dL (ref 30.0–36.0)
MCV: 91.1 fL (ref 78.0–100.0)
Monocytes Absolute: 0.7 10*3/uL (ref 0.1–1.0)
Monocytes Relative: 6 % (ref 3–12)
Neutro Abs: 9.7 10*3/uL — ABNORMAL HIGH (ref 1.7–7.7)
Neutrophils Relative %: 81 % — ABNORMAL HIGH (ref 43–77)
PLATELETS: 361 10*3/uL (ref 150–400)
RBC: 4.48 MIL/uL (ref 4.22–5.81)
RDW: 13.2 % (ref 11.5–15.5)
WBC: 11.9 10*3/uL — AB (ref 4.0–10.5)

## 2014-01-09 LAB — ETHANOL: Alcohol, Ethyl (B): <11 mg/dL (ref 0–11)

## 2014-01-09 MED ORDER — ACETAMINOPHEN 325 MG PO TABS: 650.0000 mg | ORAL_TABLET | ORAL | Status: DC | PRN

## 2014-01-09 MED ORDER — ONDANSETRON HCL 4 MG PO TABS: 4.0000 mg | ORAL_TABLET | Freq: Three times a day (TID) | ORAL | Status: DC | PRN

## 2014-01-09 MED ORDER — ZOLPIDEM TARTRATE 5 MG PO TABS: 10.0000 mg | ORAL_TABLET | Freq: Every evening | ORAL | Status: DC | PRN

## 2014-01-09 MED ORDER — HYDROXYZINE HCL 25 MG PO TABS
25.0000 mg | ORAL_TABLET | Freq: Four times a day (QID) | ORAL | Status: DC | PRN
  Administered 2014-01-12 (×2): 25 mg via ORAL
  Filled 2014-01-09 (×3): qty 1
  Filled 2014-01-09: qty 6

## 2014-01-09 MED ORDER — ASPIRIN EC 81 MG PO TBEC
81.0000 mg | DELAYED_RELEASE_TABLET | Freq: Every day | ORAL | Status: DC
  Administered 2014-01-09: 81 mg via ORAL
  Filled 2014-01-09: qty 1

## 2014-01-09 MED ORDER — ACETAMINOPHEN 325 MG PO TABS: 650.0000 mg | ORAL_TABLET | Freq: Four times a day (QID) | ORAL | Status: DC | PRN

## 2014-01-09 MED ORDER — MAGNESIUM HYDROXIDE 400 MG/5ML PO SUSP: 30.0000 mL | Freq: Every day | ORAL | Status: DC | PRN

## 2014-01-09 MED ORDER — LORAZEPAM 1 MG PO TABS
1.0000 mg | ORAL_TABLET | Freq: Three times a day (TID) | ORAL | Status: DC | PRN
  Administered 2014-01-09: 1 mg via ORAL
  Filled 2014-01-09: qty 1

## 2014-01-09 MED ORDER — NICOTINE 21 MG/24HR TD PT24: 21.0000 mg | MEDICATED_PATCH | Freq: Every day | TRANSDERMAL | Status: DC

## 2014-01-09 MED ORDER — ASPIRIN EC 81 MG PO TBEC
81.0000 mg | DELAYED_RELEASE_TABLET | Freq: Every day | ORAL | Status: DC
  Administered 2014-01-10 – 2014-01-14 (×5): 81 mg via ORAL
  Filled 2014-01-09 (×8): qty 1

## 2014-01-09 MED ORDER — BUPROPION HCL ER (XL) 150 MG PO TB24
150.0000 mg | ORAL_TABLET | Freq: Every day | ORAL | Status: DC
  Filled 2014-01-09: qty 1

## 2014-01-09 MED ORDER — TRAZODONE HCL 50 MG PO TABS
50.0000 mg | ORAL_TABLET | Freq: Every evening | ORAL | Status: DC | PRN
  Administered 2014-01-09 – 2014-01-14 (×5): 50 mg via ORAL
  Filled 2014-01-09 (×10): qty 1
  Filled 2014-01-09: qty 6
  Filled 2014-01-09 (×3): qty 1
  Filled 2014-01-09: qty 6
  Filled 2014-01-09 (×2): qty 1

## 2014-01-09 MED ORDER — BUPROPION HCL ER (XL) 150 MG PO TB24
150.0000 mg | ORAL_TABLET | Freq: Every day | ORAL | Status: DC
  Administered 2014-01-09: 150 mg via ORAL
  Filled 2014-01-09 (×4): qty 1

## 2014-01-09 MED ORDER — IBUPROFEN 400 MG PO TABS: 600.0000 mg | ORAL_TABLET | Freq: Three times a day (TID) | ORAL | Status: DC | PRN

## 2014-01-09 NOTE — BH Assessment (Addendum)
Tele Assessment Note   Mike Moran is an 58 y.o. male. Pt presents to [facility] with C/O increased depression and SI. Pt reports that he has been feeling depressed over the past couple of months. Pt reports that he feels that he and his wife thought it would be a good idea for patient to see his primary care doctor today due to depressive symptoms. Pt [place] to [facility] for an evaluation. Pt reports that he pointed a loaded gun to his head and contemplated pulling the trigger. Pt reports that he was scared to do it. Pt endorses increased thoughts of suicide and has thoughts of plans to hang himself with a rope. Pt reports financial turmoil between he and his brother that have created issues with there relationship. Pt reports excessive worrying and feels that he would be better off dead. Pt reports that he has been prescribed Wellbutrin for the past 60 days and feels no improvement with his mood. Pt reports decreased sleep. Pt presents tearful,depressed, and despondent. Pt denies HI and no AVH reported. Inpatient treatment recommended for safety and stabilization.  Consulted with AC Berneice Heinrichina Tate and Claudette Headonrad Withrow who is [facility]. Pt assigned to bed 303-1. Voluntary Consent form completed by pt's ED nurse.   Axis I: Major Depression, single episode Axis II: Deferred Axis III:  Past Medical History  Diagnosis Date  . Depression    Axis IV: other psychosocial or environmental problems and problems related to social environment Axis V: 21-30 behavior considerably influenced by delusions or hallucinations OR serious impairment in judgment, communication OR inability to function in almost all areas  Past Medical History:  Past Medical History  Diagnosis Date  . Depression     History reviewed. No pertinent past surgical history.  Family History: History  reviewed. No pertinent family history.  Social History:  reports that he has quit smoking. He does not have any smokeless tobacco history on file. He reports that he drinks alcohol. He reports that he does not use illicit drugs.  Additional Social History:  Alcohol / Drug Use History of alcohol / drug use?: No history of alcohol / drug abuse  CIWA: CIWA-Ar BP: 164/88 mmHg Pulse Rate: 70 COWS:    PATIENT STRENGTHS: (choose at least two) Ability for insight Average or above average intelligence  Allergies:  Allergies  Allergen Reactions  . Codeine Nausea And Vomiting  . Lexapro [Escitalopram Oxalate] Other (See Comments)    lethargic    Home Medications:  (Not in a hospital admission)  OB/GYN Status:  No LMP for male patient.  General Assessment Data Location of Assessment: [facility] ED Is this a Tele or Face-to-Face Assessment?: Tele Assessment Is this an Initial Assessment or a Re-assessment for this encounter?: Initial Assessment Living Arrangements: Spouse/significant other Can pt return to current living arrangement?: Yes Admission Status: Voluntary Is patient capable of signing voluntary admission?: Yes Transfer from: Other (Comment) (Pt stated was transported by police from his PCP office.) Referral Source: MD     [facility] Living Arrangements: Spouse/significant other Name of Psychiatrist: No Current Provider Name of Therapist: No Current Provider     Risk to self with the past 6 months Suicidal Ideation: Yes-Currently Present Suicidal Intent: Yes-Currently Present Is patient at risk for suicide?: Yes Suicidal Plan?: Yes-Currently Present Specify Current Suicidal Plan: Shoot self in the head with a gun  or hang himself with a rope Access to Means: Yes Specify Access to Suicidal Means: access to a gun What has been your use of drugs/alcohol within the last 12 months?: pt denies SA Use Previous Attempts/Gestures: No (Pt reports that he thinks about  suicide all the time.) How many times?: 0 Other Self Harm Risks: none reported Triggers for Past Attempts: Family contact, Other personal contacts Intentional Self Injurious Behavior: None Family Suicide History: No Recent stressful life event(s): Conflict (Comment) (financial conflict about his father's financial affairs) Persecutory voices/beliefs?: No Depression: Yes Depression Symptoms: Insomnia, Loss of interest in usual pleasures, Feeling worthless/self pity Substance abuse history and/or treatment for substance abuse?: No Suicide prevention information given to non-admitted patients: Not applicable  Risk to Others within the past 6 months Homicidal Ideation: No Thoughts of Harm to Others: No Current Homicidal Intent: No Current Homicidal Plan: No Access to Homicidal Means: No Identified Victim: No History of harm to others?: No Assessment of Violence: None Noted Violent Behavior Description: None Noted Does patient have access to weapons?: Yes (Comment) (Pt has access to guns that he owns.) Criminal Charges Pending?: No Does patient have a court date: No  Psychosis Hallucinations: None noted Delusions: None noted  Mental Status Report Appear/Hygiene: In scrubs Eye Contact: Fair Motor Activity: Restlessness Speech: Logical/coherent Level of Consciousness: Alert Mood: Depressed, Anxious Affect: Appropriate to circumstance, Depressed Anxiety Level: Minimal Thought Processes: Coherent, Relevant Judgement: Impaired Orientation: Person, Place, Time, Situation Obsessive Compulsive Thoughts/Behaviors: None  Cognitive Functioning Concentration: Normal Memory: Recent Intact, Remote Intact IQ: Average Insight: Fair Impulse Control: Fair Appetite: Fair Weight Loss: 0 Weight Gain: 0 Sleep: Decreased Total Hours of Sleep: 2 Vegetative Symptoms: None  ADLScreening [facility]) Patient's cognitive ability adequate to safely complete daily activities?:  Yes Patient able to express need for assistance with ADLs?: Yes Independently performs ADLs?: Yes (appropriate for developmental age)  Prior Inpatient Therapy Prior Inpatient Therapy: No Prior Therapy Dates: na Prior Therapy Facilty/Provider(s): na Reason for Treatment: na  Prior Outpatient Therapy Prior Outpatient Therapy: No Prior Therapy Dates: na Prior Therapy Facilty/Provider(s): na Reason for Treatment: na  ADL Screening (condition at time of admission) Patient's cognitive ability adequate to safely complete daily activities?: Yes Is the patient deaf or have difficulty hearing?: No Does the patient have difficulty seeing, even when wearing glasses/contacts?: No Does the patient have difficulty concentrating, remembering, or making decisions?: No Patient able to express need for assistance with ADLs?: Yes Does the patient have difficulty dressing or bathing?: No Independently performs ADLs?: Yes (appropriate for developmental age) Does the patient have difficulty walking or climbing stairs?: No Weakness of Legs: None Weakness of Arms/Hands: None  Home Assistive Devices/Equipment Home Assistive Devices/Equipment: None    Abuse/Neglect Assessment (Assessment to be complete while patient is alone) Physical Abuse: Denies Verbal Abuse: Denies Sexual Abuse: Denies Exploitation of patient/patient's resources: Denies Self-Neglect: Denies     Merchant navy officerAdvance Directives (For Healthcare) Does patient have an advance directive?: No Would patient like information on creating an advanced directive?: No - patient declined information    Additional Information 1:1 In Past 12 Months?: No CIRT Risk: No Elopement Risk: No Does patient have medical clearance?: Yes     Disposition:  Disposition Initial Assessment Completed for this Encounter: Yes Disposition of Patient: Inpatient treatment program  Bjorn Pippinresley, Ezell Melikian Sabreen Glorious PeachNajah Meriah Shands, MS, LCASA Assessment Counselor  01/09/2014  3:17 PM

## 2014-01-09 NOTE — ED Notes (Signed)
Pt states in triage that he "wants to die", states this has been building up over the past year, sent from Dr. Lamar BlinksGolding's office for suicidal ideation. Pt repeats over and over that he thinks about killing himself everyday.

## 2014-01-09 NOTE — ED Notes (Addendum)
[facility] service here at this time [facility]. PT's belongs (clothes, boots) given to pt with transport service. PT personal wallet and cell phone given to his wife per pt's request at this time.

## 2014-01-09 NOTE — BH Assessment (Signed)
Consulted with AC Berneice Heinrichina Tate and Claudette Headonrad Withrow, NP who is [facility]. [facility] assigned to 303-1. Assigned Attending Physician is Dr.Cobos Informed EDP Dr. Jeraldine LootsLockwood of pt's [facility].  Pt's ED nurse has been [facility] and is in agreement with having patient sign voluntary consent forms prior to transfer via [facility].  Glorious PeachNajah Gianelle Mccaul, MS, LCASA Assessment Counselor

## 2014-01-09 NOTE — ED Provider Notes (Signed)
CSN: [id number]     Arrival date & time 01/09/14  1120 History  This chart was scribed for Ward Givens, MD by Annye Asa, ED Scribe. This patient was seen in room APA16A/APA16A and the patient's care was started at 12:56 PM.    Chief Complaint  Patient presents with  . V70.1   The history is provided by the patient. No language interpreter was used.     HPI Comments: Mike Moran is a 58 y.o. male who presents to the Emergency Department complaining of 4-5 months of depression and now with suicidal thoughts. He also reports specific plans for suicide; he "put a gun to his head yesterday" but did not pull the trigger, "due to fear."  He has difficulty sleeping at night, acutely worsening over the last three days. Patient was recently placed on Wellbutrin; he is 60 days into treatment and reports no improvement. He is a heating and Engineer, maintenance (IT) by trade but has had difficulty focusing at work. He reports he has no joy or happiness in his life. He no longer enjoys his job. He states he can't enjoy sex with his wife anymore. He states he sees no hope in life.  Patient explains that around this time there were some family complications and legal concerns; this issue may have contributed to his current emotional state. He reports that the resentment from this conflict will not absolve with time, though the conflict itself is now over. He denies a history of depression or SI; he also states now he is worrying "about everything". He explains that this sense of worry is very unusual for him, "like someone else is living in his body." He denies family history of depression or suicide. He denies hearing voices.  PCP Dr Phillips Odor  Past Medical History  Diagnosis Date  . Depression    History reviewed. No pertinent past surgical history. History reviewed. No pertinent family history. History  Substance Use Topics  . Smoking status: Former Games developer  . Smokeless tobacco: Not on file  . Alcohol  Use: Yes     Comment: occ- pt denies SA use  lives at home Lives with spouse employed  Review of Systems  Psychiatric/Behavioral: Positive for suicidal ideas, sleep disturbance, dysphoric mood and decreased concentration. Negative for hallucinations. The patient is not hyperactive.   All other systems reviewed and are negative.   Allergies  Codeine and Lexapro  Home Medications   Prior to Admission medications   Medication Sig Start Date End Date Taking? Authorizing Provider  aspirin EC 81 MG tablet Take 81 mg by mouth daily.   Yes Historical Provider, MD  buPROPion (WELLBUTRIN XL) 150 MG 24 hr tablet Take 1 tablet by mouth at bedtime.  11/06/13  Yes Historical Provider, MD  Multiple Vitamins-Minerals (MEGA MULTIVITAMIN FOR MEN) TABS Take 1 tablet by mouth daily.   Yes Historical Provider, MD  ondansetron (ZOFRAN) 4 MG tablet Take 1 tablet (4 mg total) by mouth every 6 (six) hours. Patient not taking: Reported on 01/09/2014 11/28/13   Gilda Crease, MD   BP 164/88 mmHg  Pulse 70  Temp(Src) 98.3 F (36.8 C) (Oral)  Resp 17  Ht 6' (1.829 m)  Wt 150 lb (68.04 kg)  BMI 20.34 kg/m2  SpO2 99%  Vital signs normal   Physical Exam  Constitutional: He is oriented to person, place, and time. He appears well-developed and well-nourished.  Non-toxic appearance. He does not appear ill.  HENT:  Head: Normocephalic and  atraumatic.  Right Ear: External ear normal.  Left Ear: External ear normal.  Nose: Nose normal. No mucosal edema or rhinorrhea.  Mouth/Throat: Oropharynx is clear and moist and mucous membranes are normal. No dental abscesses or uvula swelling.  Eyes: Conjunctivae and EOM are normal. Pupils are equal, round, and reactive to light.  Neck: Normal range of motion and full passive range of motion without pain. Neck supple.  Cardiovascular: Normal rate, regular rhythm and normal heart sounds.  Exam reveals no gallop and no friction rub.   No murmur  heard. Pulmonary/Chest: Effort normal and breath sounds normal. No respiratory distress. He has no wheezes. He has no rhonchi. He has no rales. He exhibits no tenderness and no crepitus.  Abdominal: Soft. Normal appearance and bowel sounds are normal. He exhibits no distension. There is no tenderness. There is no rebound and no guarding.  Musculoskeletal: Normal range of motion. He exhibits no edema or tenderness.  Moves all extremities well.   Neurological: He is alert and oriented to person, place, and time. He has normal strength. No cranial nerve deficit.  Skin: Skin is warm, dry and intact. No rash noted. No erythema. No pallor.  Psychiatric: His mood appears anxious. His speech is delayed. He is slowed. He exhibits a depressed mood.  Poor eye contact, tearful at times  Nursing note and vitals reviewed.   ED Course  Procedures   Medications  LORazepam (ATIVAN) tablet 1 mg (not administered)  acetaminophen (TYLENOL) tablet 650 mg (not administered)  ibuprofen (ADVIL,MOTRIN) tablet 600 mg (not administered)  zolpidem (AMBIEN) tablet 10 mg (not administered)  nicotine (NICODERM CQ - dosed in mg/24 hours) patch 21 mg (21 mg Transdermal Not Given 01/09/14 1440)  ondansetron (ZOFRAN) tablet 4 mg (not administered)  aspirin EC tablet 81 mg (not administered)  buPROPion (WELLBUTRIN XL) 24 hr tablet 150 mg (not administered)     DIAGNOSTIC STUDIES: Oxygen Saturation is 99% on RA, normal by my interpretation.    COORDINATION OF CARE: 1:07 PM Discussed treatment plan with pt at bedside and pt agreed to plan.  14:34 Najah, TSS, called to discuss reason for consult.    Labs Review   Imaging Review Results for orders placed or performed during the hospital encounter of 01/09/14  CBC with Differential  Result Value Ref Range   WBC 11.9 (H) 4.0 - 10.5 K/uL   RBC 4.48 4.22 - 5.81 MIL/uL   Hemoglobin 14.1 13.0 - 17.0 g/dL   HCT 29.540.8 28.439.0 - 13.252.0 %   MCV 91.1 78.0 - 100.0 fL   MCH  31.5 26.0 - 34.0 pg   MCHC 34.6 30.0 - 36.0 g/dL   RDW 44.013.2 10.211.5 - 72.515.5 %   Platelets 361 150 - 400 K/uL   Neutrophils Relative % 81 (H) 43 - 77 %   Neutro Abs 9.7 (H) 1.7 - 7.7 K/uL   Lymphocytes Relative 12 12 - 46 %   Lymphs Abs 1.4 0.7 - 4.0 K/uL   Monocytes Relative 6 3 - 12 %   Monocytes Absolute 0.7 0.1 - 1.0 K/uL   Eosinophils Relative 1 0 - 5 %   Eosinophils Absolute 0.1 0.0 - 0.7 K/uL   Basophils Relative 0 0 - 1 %   Basophils Absolute 0.1 0.0 - 0.1 K/uL  Comprehensive metabolic panel  Result Value Ref Range   Sodium 138 137 - 147 mEq/L   Potassium 4.5 3.7 - 5.3 mEq/L   Chloride 100 96 - 112 mEq/L   CO2  26 19 - 32 mEq/L   Glucose, Bld 113 (H) 70 - 99 mg/dL   BUN 13 6 - 23 mg/dL   Creatinine, Ser 1.610.68 0.50 - 1.35 mg/dL   Calcium 9.8 8.4 - 09.610.5 mg/dL   Total Protein 7.4 6.0 - 8.3 g/dL   Albumin 3.9 3.5 - 5.2 g/dL   AST 17 0 - 37 U/L   ALT 13 0 - 53 U/L   Alkaline Phosphatase 67 39 - 117 U/L   Total Bilirubin 0.4 0.3 - 1.2 mg/dL   GFR calc non Af Amer >90 >90 mL/min   GFR calc Af Amer >90 >90 mL/min   Anion gap 12 5 - 15  Ethanol  Result Value Ref Range   Alcohol, Ethyl (B) <11 0 - 11 mg/dL  Urinalysis, Routine w reflex microscopic  Result Value Ref Range   Color, Urine YELLOW YELLOW   APPearance CLEAR CLEAR   Specific Gravity, Urine 1.025 1.005 - 1.030   pH 6.5 5.0 - 8.0   Glucose, UA NEGATIVE NEGATIVE mg/dL   Hgb urine dipstick NEGATIVE NEGATIVE   Bilirubin Urine NEGATIVE NEGATIVE   Ketones, ur NEGATIVE NEGATIVE mg/dL   Protein, ur NEGATIVE NEGATIVE mg/dL   Urobilinogen, UA 0.2 0.0 - 1.0 mg/dL   Nitrite NEGATIVE NEGATIVE   Leukocytes, UA NEGATIVE NEGATIVE  Drug screen panel, emergency  Result Value Ref Range   Opiates NONE DETECTED NONE DETECTED   Cocaine NONE DETECTED NONE DETECTED   Benzodiazepines NONE DETECTED NONE DETECTED   Amphetamines NONE DETECTED NONE DETECTED   Tetrahydrocannabinol POSITIVE (A) NONE DETECTED   Barbiturates NONE DETECTED  NONE DETECTED  Salicylate level  Result Value Ref Range   Salicylate Lvl 1.0 (L) 2.8 - 20.0 mg/dL   Laboratory interpretation all normal except leukocytosis, +UDS      EKG Interpretation   Date/Time:  Wednesday January 09 2014 15:06:57 EST Ventricular Rate:  69 PR Interval:  176 QRS Duration: 100 QT Interval:  364 QTC Calculation: 390 R Axis:   29 Text Interpretation:  Normal sinus rhythm Normal ECG No significant change  since last tracing 28 Nov 2013 Confirmed by [facility]  MD-I, Ryken Paschal ([id number]) on  01/09/2014 3:46:41 PM      MDM   patient presents with extreme depression and suicidal thoughts with a almost committing suicide by shooting himself yesterday. Patient will definitely need inpatient admission. At this point patient is voluntary.    Final diagnoses:  Depressed  Suicidal ideation    Disposition pending  I personally performed the services described in this documentation, which was scribed in my presence. The recorded information has been reviewed and considered.     Ward GivensIva L Keshan Reha, MD 01/09/14 [id number]

## 2014-01-09 NOTE — ED Notes (Signed)
Pt voluntarily went to [facility] this am and was sent here based on their assessment.   Pt states, "I want to end my life. I had a gun to my head yesterday. My thoughts are not right and I worry all of the time about everything."  Pt explains he started taking a new medication, Lexpro,  60 days ago. Pt states his father is drying and the family has had recent discrepancy on who his POA will be.    During assessment, pt was calm but tearful. Pt is following instructions.   Pt denies HI. Pt denies having AVH.   Pt states he only wants to hurt himself.

## 2014-01-09 NOTE — ED Notes (Signed)
MD at bedside. 

## 2014-01-09 NOTE — ED Notes (Signed)
Voluntary commitant paperwork [facility] by pt [facility]

## 2014-01-09 NOTE — ED Provider Notes (Signed)
[facility]  Gerhard Munchobert Lailah Marcelli, MD 01/09/14 [phone]

## 2014-01-09 NOTE — BH Assessment (Signed)
Spoke with EDP Dr.Iva Lynelle DoctorKnapp to obtain clinicals prior to assessing patient. She reports that patient is Depression, Suicidal, and not sleeping well and she voices concern about pt's safety and pt possibly outing on SI.   Glorious PeachNajah Zenith Lamphier, MS, LCASA Assessment Counselor

## 2014-01-10 DIAGNOSIS — F322 Major depressive disorder, single episode, severe without psychotic features: Principal | ICD-10-CM

## 2014-01-10 DIAGNOSIS — R45851 Suicidal ideations: Secondary | ICD-10-CM

## 2014-01-10 LAB — LIPID PANEL
CHOL/HDL RATIO: 4.2 ratio
Cholesterol: 237 mg/dL — ABNORMAL HIGH (ref 0–200)
HDL: 57 mg/dL (ref >39)
LDL Cholesterol: 160 mg/dL — ABNORMAL HIGH (ref 0–99)
Triglycerides: 102 mg/dL (ref <150)
VLDL: 20 mg/dL (ref 0–40)

## 2014-01-10 LAB — TSH: TSH: 2.09 u[IU]/mL (ref 0.350–4.500)

## 2014-01-10 LAB — HEMOGLOBIN A1C
HEMOGLOBIN A1C: 5.6 % (ref <5.7)
MEAN PLASMA GLUCOSE: 114 mg/dL (ref <117)

## 2014-01-10 MED ORDER — LORAZEPAM 0.5 MG PO TABS
0.5000 mg | ORAL_TABLET | Freq: Three times a day (TID) | ORAL | Status: DC
  Administered 2014-01-10 – 2014-01-14 (×11): 0.5 mg via ORAL
  Filled 2014-01-10 (×13): qty 1

## 2014-01-10 MED ORDER — VENLAFAXINE HCL ER 75 MG PO CP24
75.0000 mg | ORAL_CAPSULE | Freq: Every day | ORAL | Status: DC
  Administered 2014-01-10 – 2014-01-14 (×5): 75 mg via ORAL
  Filled 2014-01-10: qty 1
  Filled 2014-01-10: qty 3
  Filled 2014-01-10 (×6): qty 1

## 2014-01-10 NOTE — Progress Notes (Signed)
D   Pt is pleasant on approach and has some mild thought blocking   He endorses anxiety and depression  He interacts well with others and reports medications are helping with his anxiety and his mood is less depressed A   Verbal support given   Medications administered and effectiveness monitored   Q 15 min checks R   Pt safe at present and receptive to verbal support

## 2014-01-10 NOTE — [facility] Suicide Risk Assessment (Signed)
[facility] INPATIENT:  Family/Significant Other Suicide Prevention Education  Suicide Prevention Education:  Education Completed; [name] Eslick, Wife, [phone];  has been identified by the patient as the family member/significant otherSelinda Orion with whom the patient will be residing, and identified as the person(s) who will aid the patient in the event of a mental health crisis (suicidal ideations/suicide attempt).  With written consent from the patient, the family member/significant other has been provided the following suicide prevention education, prior to the and/or following the discharge of the patient.  The suicide prevention education provided includes the following:  Suicide risk factors  Suicide prevention and interventions  National Suicide Hotline telephone number  [facility] assessment telephone number  [facility] 911  [facility] and/or Residential Mobile Crisis Unit telephone number  Request made of family/significant other to:  Remove weapons (e.g., guns, rifles, knives), all items previously/currently identified as safety concern.  Wife advised gun will be secured prior to patient discharging.    Remove drugs/medications (over-the-counter, prescriptions, illicit drugs), all items previously/currently identified as a safety concern.  The family member/significant other verbalizes understanding of the suicide prevention education information provided.  The family member/significant other agrees to remove the items of safety concern listed above.  Wynn BankerHodnett, Hydeia Mcatee Hairston 01/10/2014, 12:47 PM

## 2014-01-10 NOTE — Progress Notes (Signed)
Patient ID: Mike Moran, male   DOB: May 01, 1955, 58 y.o.   MRN: [id number] He has been up sitting in hallway. Talked to him and he was tearful hopeless and very depressed. He spoke of his parents, younger brother and about his false teeth not fitting. Stated that he was so depressed that he would harm himself if he were at home. He fells that things had accumulate over time and now there is nothing that can be done. Social worked then came and spoke to him. Self inventory: depression 8, hopelessness 8, anxiety . Withdrawals N/A.Positive for SI contracts for safety. No physical pain. Goal is to sleep. Wish I was normal and happy. This afternoon he did go to group and had a slight smile. When asked if he was feeling better. Stated" you feel better after a meeting".

## 2014-01-10 NOTE — Progress Notes (Signed)
This is a 58 years old Caucasian male admitted to the unit d/t depression and suicide thoughts with plan to shoot himself with gun. Patient reported that his depression started few years ago when his brother conned their father who had alzheimer's to get POA and sign over everything to the brother. He also said that he's been working for 30 years and planned to retire at age 58 but with the ways things are with him he doesn't think that would happen. He also said he recently had all his teeth removed because they were bad and got dentures but the denture were not working and he could no longer enjoy what he enjoyed eating before; "I love hard vegetables , I can eat them anymore". He endorsed serious problem with sleeping. " I worry about everything and I don't sleep, If I could just get a good night sleep". Patient continued to endorse passive SI but verbally contracted for safety. Writer said his doctor started him om Wellbutrin but it didn't work and it worsened his problem with insomnia. Writer encouraged and supported patient. Q 15 minute check initiated.

## 2014-01-10 NOTE — Clinical Social Work Note (Signed)
CSW spoke with patient'[s wife, Selinda Orionenny Shinn, [phone]; advised patient's sister had the same negative side effects to Wellbutrin and that his condition has worsened and may pass at anytime.  She is wanting to advised patient that his father could pass at anytime.  Wife encouraged to speak with patient's RN when she arrives this evening.

## 2014-01-10 NOTE — [facility] Suicide Risk Assessment (Signed)
   Nursing information obtained from:  Patient Demographic factors:  Access to firearms Current Mental Status:  Suicidal ideation indicated by patient Loss Factors:  NA Historical Factors:  NA Risk Reduction Factors:  Employed Total Time spent with patient: 45 minutes  CLINICAL FACTORS:  Worsening depression and anxiety, recent suicidal ideations  Psychiatric Specialty Exam: Physical Exam  ROS  Blood pressure 99/73, pulse 88, temperature 99.3 F (37.4 C), temperature source Oral, resp. rate 18, height 6\' 1"  (1.854 m), weight 68.947 kg (152 lb).Body mass index is 20.06 kg/(m^2).  See ADMIT NOTE MSE   COGNITIVE FEATURES THAT CONTRIBUTE TO RISK:  Closed-mindedness    SUICIDE RISK:   Moderate:  Frequent suicidal ideation with limited intensity, and duration, some specificity in terms of plans, no associated intent, good self-control, limited dysphoria/symptomatology, some risk factors present, and identifiable protective factors, including available and accessible social support.  PLAN OF CARE: Patient will be admitted to inpatient psychiatric unit for stabilization and safety. Will provide and encourage milieu participation. Provide medication management and maked adjustments as needed.  Will follow daily.    I certify that inpatient services furnished can reasonably be expected to improve the patient's condition.  Carol Loftin 01/10/2014, 3:34 PM

## 2014-01-10 NOTE — Progress Notes (Signed)
Recreation Therapy Notes  Animal-Assisted Activity/Therapy (AAA/T) Program Checklist/Progress Notes Patient Eligibility Criteria Checklist & Daily Group note for Rec Tx Intervention  Date: 12.03.2015 Time: 2:45pm [facility] [address] [facility]    AAA/T Program Assumption of Risk Form signed by Patient/ or Parent Legal Guardian yes  Patient is free of allergies or sever asthma yes  Patient reports no fear of animals yes  Patient reports no history of cruelty to animals yes  Patient understands his/her participation is voluntary yes  Behavioral Response: Did not attend.   Marykay Lexenise L Kylar Speelman, LRT/CTRS  Jearl KlinefelterBlanchfield, Uma Jerde L 01/10/2014 4:38 PM
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
## 2014-01-10 NOTE — Progress Notes (Signed)
Pt stated that as the day went on, it got better. He stated that he experienced "culture shock" being here because it remains him so much of being in the [employer] and Compan[employer]ilitary. His family visited with him this evening. He is accepting the fact that he has depression and is glad that he was able to let his family what is really going on with him.

## 2014-01-10 NOTE — [facility] Group Notes (Signed)
[facility] LCSW Group Therapy  [facility] 1:15 - 2:30 PM  01/10/2014   Type of Therapy:  Group Therapy  Participation Level: Active  Participation Quality:  Attentive  Affect: Depressed  Cognitive:  Appropriate  Insight:  Developing/Improving   Engagement in Therapy:  Developing/Improving   Modes of Intervention:  Discussion, Education, Exploration, Problem-Solving, Rapport Building, Support   Summary of Progress/Problems:   Patient was attentive to speaker from the [facility] Association as he shared his story of dealing with mental health/substance abuse issues and overcoming it by working a recovery program.  Patient expressed interest in their programs and services and received information on their agency.  Patient shared he enjoyed the presentation and learned that sometimes you have to "cry" for help.  Wynn BankerHodnett, Azeneth Carbonell Hairston 01/10/2014

## 2014-01-10 NOTE — [facility] Group Notes (Signed)
0900 nursing orientation group    The focus of this group is to educate the patient on the purpose and policies of crisis stabilization and provide a format to answer questions about their admission.  The group details unit policies and expectations of patients while admitted.  Pt did not attend he was in bed asleep and refused to get up.
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
## 2014-01-11 NOTE — [facility] Group Notes (Signed)
[facility] LCSW Group Therapy  Feelings Around Relapse 1:15 -2:30        01/11/2014  2:39 PM   Type of Therapy:  Group Therapy  Participation Level:  Appropriate  Participation Quality:  Appropriate  Affect:  Appropriate  Cognitive:  Attentive Appropriate  Insight:  Developing/Improving  Engagement in Therapy: Developing/Improving  Modes of Intervention:  Discussion Exploration Problem-Solving Supportive  Summary of Progress/Problems:  The topic for today was feelings around relapse.    Patient processed feelings toward relapse and was able to relate to peers. Patient shared he does not want to relapse into depression.  He advised it has been good for him to be here and he wants to take the good feelings and what he has learned with him at discharge.  Patient identified coping skills that can be used to prevent a relapse.   Mike Moran, Mike Moran 01/11/2014  2:39 PM

## 2014-01-11 NOTE — [facility] Group Notes (Signed)
[facility]   01/11/2014 10:51 AM    Participation Quality:  Appropraite  Mood/Affect:  Appropriate  Depression Rating:  3  Anxiety Rating:  3  Thoughts of Suicide:  No  Will you contract for safety?   NA  Current AVH:  No  Plan for Discharge/Comments:  Patient attended discharge planning group and actively participated in group. Patient reports feeling much better.  He will return to his home and [facility]  Suicide prevention education reviewed and SPE document provided.   Transportation Means: Patient has transportation.   Supports:  Patient has a support system.   Mike Moran, Mike Moran

## 2014-01-11 NOTE — Progress Notes (Addendum)
[facility] MD Progress Note  01/11/2014 1:09 PM Mike Moran  MRN:  [id number] Subjective:   Patient states he is feeling better.  Objective:   I have discussed case with treatment team and met with patient.  Although remains depressed, he states he is feeling  Better.  He is improved compared to admission, and the severity of his depression and anxiety symptoms has decreased.  At this time he denies any medication side effects, and so far has tolerated Ativan/Effexor XR well. He has had no ongoing suicidal ideations , and behavior on unit has been in good control. He is going to some groups. Neuro-vegetative symptoms of depression are improving. Energy level still low, but Appetite is improving, and slept better last night. Patient was visited by family yesterday evening and states visit went well. TSH WNL. Lipid panel - cholesterol is elevated at 237.   Diagnosis:  MDD, GAD  Total Time spent with patient: 25 minutes    ADL's:  fair  Sleep: patient describes as improving  Appetite:  Improved   Suicidal Ideation:  Currently denies any suicidal ideations Homicidal Ideation:  Currently denies any homicidal ideations AEB (as evidenced by):  Psychiatric Specialty Exam: Physical Exam  ROS  Blood pressure 110/67, pulse 77, temperature 97.9 F (36.6 C), temperature source Oral, resp. rate 16, height $RemoveBe'6\' 1"'LrEDiOYlP$  (1.854 m), weight 68.947 kg (152 lb).Body mass index is 20.06 kg/(m^2).  General Appearance: Fairly Groomed  Engineer, water::  Good  Speech:  Normal Rate  Volume:  Decreased  Mood:  less depressed, affect more reactive  Affect:  less constricted, more reactive  Thought Process:  Linear  Orientation:  Full (Time, Place, and Person)  Thought Content:  no hallucinations, no delusions  Suicidal Thoughts:  No at this time denies any thoughts of hurting self and  contracts for safety on unit   Homicidal Thoughts:  No  Memory:  Recent and remote grossly intact   Judgement:  Other:   improved  Insight:  Fair  Psychomotor Activity:  Normal  Concentration:  Good  Recall:  Good  Fund of Knowledge:Good  Language: Good  Akathisia:  Negative  Handed:  Right  AIMS (if indicated):     Assets:  Desire for Improvement Resilience Social Support  Sleep:  Number of Hours: 3.75   Musculoskeletal: Strength & Muscle Tone: within normal limits Gait & Station: normal Patient leans: N/A  Current Medications: Current Facility-Administered Medications  Medication Dose Route Frequency Provider Last Rate Last Dose  . acetaminophen (TYLENOL) tablet 650 mg  650 mg Oral Q6H PRN Laverle Hobby, PA-C      . aspirin EC tablet 81 mg  81 mg Oral Daily Laverle Hobby, PA-C   81 mg at 01/11/14 3545  . hydrOXYzine (ATARAX/VISTARIL) tablet 25 mg  25 mg Oral Q6H PRN Laverle Hobby, PA-C      . LORazepam (ATIVAN) tablet 0.5 mg  0.5 mg Oral TID Jenne Campus, MD   Stopped at 01/11/14 1214  . magnesium hydroxide (MILK OF MAGNESIA) suspension 30 mL  30 mL Oral Daily PRN Laverle Hobby, PA-C      . traZODone (DESYREL) tablet 50 mg  50 mg Oral QHS,MR X 1 Laverle Hobby, PA-C   50 mg at 01/10/14 2201  . venlafaxine XR (EFFEXOR-XR) 24 hr capsule 75 mg  75 mg Oral Q breakfast Jenne Campus, MD   75 mg at 01/11/14 6256    Lab Results:  Results for orders  placed or performed during the hospital encounter of 01/09/14 (from the past 48 hour(s))  TSH     Status: None   Collection Time: 01/10/14  6:19 AM  Result Value Ref Range   TSH 2.090 0.350 - 4.500 uIU/mL    Comment: Performed at [facility]  Lipid panel, fasting     Status: Abnormal   Collection Time: 01/10/14  6:19 AM  Result Value Ref Range   Cholesterol 237 (H) 0 - 200 mg/dL   Triglycerides 102 <150 mg/dL   HDL 57 >39 mg/dL   Total CHOL/HDL Ratio 4.2 RATIO   VLDL 20 0 - 40 mg/dL   LDL Cholesterol 160 (H) 0 - 99 mg/dL    Comment:        Total Cholesterol/HDL:CHD Risk Coronary Heart Disease Risk Table                      Men   Women  1/2 Average Risk   3.4   3.3  Average Risk       5.0   4.4  2 X Average Risk   9.6   7.1  3 X Average Risk  23.4   11.0        Use the calculated Patient Ratio above and the CHD Risk Table to determine the patient's CHD Risk.        ATP III CLASSIFICATION (LDL):  <100     mg/dL   Optimal  100-129  mg/dL   Near or Above                    Optimal  130-159  mg/dL   Borderline  160-189  mg/dL   High  >190     mg/dL   Very High Performed at [facility]   Hemoglobin A1c     Status: None   Collection Time: 01/10/14  6:19 AM  Result Value Ref Range   Hgb A1c MFr Bld 5.6 <5.7 %    Comment: (NOTE)                                                                       According to the ADA Clinical Practice Recommendations for 2011, when HbA1c is used as a screening test:  >=6.5%   Diagnostic of Diabetes Mellitus           (if abnormal result is confirmed) 5.7-6.4%   Increased risk of developing Diabetes Mellitus References:Diagnosis and Classification of Diabetes Mellitus,Diabetes MBWG,6659,93(TTSVX 1):S62-S69 and Standards of Medical Care in         Diabetes - 2011,Diabetes Care,2011,34 (Suppl 1):S11-S61.    Mean Plasma Glucose 114 <117 mg/dL    Comment: Performed at [facility]    Physical Findings: AIMS: Facial and Oral Movements Muscles of Facial Expression: None, normal Lips and Perioral Area: None, normal Jaw: None, normal Tongue: None, normal, , Trunk Movements Neck, shoulders, hips: None, normal, Overall Severity Severity of abnormal movements (highest score from questions above): None, normal Incapacitation due to abnormal movements: None, normal Patient's awareness of abnormal movements (rate only patient's report): No Awareness, Dental Status Current problems with teeth and/or dentures?: Yes Does patient usually wear dentures?: Yes  CIWA:  CIWA-Ar Total: 0 COWS:  COWS Total Score: 0   Assessment: Patient is improving- less severely  depressed, exhibiting a fuller range of affect, not suicidal at present. Anxiety symptoms/excessive anxious ruminations also improved. Thus far tolerating Ativan and Effexor XR trial well.  Treatment Plan Summary: Daily contact with patient to assess and evaluate symptoms and progress in treatment Medication management See below  Plan: Continue inpatient treatment , milieu, support Continue Effexor XR 75 mgrs QDAY Continue Ativan 0.5  mgrs TID Continue Trazodone 50 mgrs QHS as needed for insomnia  Medical Decision Making Problem Points:  Established problem, stable/improving (1), Review of last therapy session (1) and Review of psycho-social stressors (1) Data Points:  Review or order clinical lab tests (1) Review of medication regiment & side effects (2)  I certify that inpatient services furnished can reasonably be expected to improve the patient's condition.   Mike Moran, Mike Moran 01/11/2014, 1:09 PM

## 2014-01-11 NOTE — Tx Team (Signed)
Interdisciplinary Treatment Plan Update   Date Reviewed:  01/11/2014  Time Reviewed:  10:49 AM  Progress in Treatment:   Attending groups: Yes Participating in groups: Yes Taking medication as prescribed: Yes  Tolerating medication: Yes Family/Significant other contact made: Yes, collateral contact with wife. Patient understands diagnosis:Yes, patient understands diagnosis and need for treatment. Discussing patient identified problems/goals with staff: Yes, patient is able to express goals for treatment and discharge. Medical problems stabilized or resolved: Yes Denies suicidal/homicidal ideation: Yes Patient has not harmed self or others: Yes  For review of initial/current patient goals, please see plan of care.  Estimated Length of Stay:  1-2 days  Reasons for Continued Hospitalization:  Anxiety Depression Medication stabilization   New Problems/Goals identified:    Discharge Plan or Barriers:   Home [facility]  Additional Comments:  Continue medication stabilization  Patient and CSW reviewed patient's identified goals and treatment plan.  Patient verbalized understanding and agreed to treatment plan.   Attendees:  Patient:  01/11/2014 10:49 AM   Signature:  Sallyanne HaversF. Cobos, MD 01/11/2014 10:49 AM  Signature: Geoffery LyonsIrving Lugo, MD 01/11/2014 10:49 AM  Signature:  Michaelle BirksBritney Guthrie, RN 01/11/2014 10:49 AM  Signature: 01/11/2014 10:49 AM  Signature:   01/11/2014 10:49 AM  Signature:  Juline PatchQuylle Micheil Klaus, LCSW 01/11/2014 10:49 AM  Signature:  Belenda CruiseKristin Drinkard, LCSW-A 01/11/2014 10:49 AM  Signature:  Leisa LenzValerie Enoch, Care Coordinator [facility] 01/11/2014 10:49 AM  Signature:  Aloha GellKrista Dopson, RN 01/11/2014 10:49 AM  Signature: 01/11/2014  10:49 AM  Signature:   Onnie BoerJennifer Clark, RN [facility] 01/11/2014  10:49 AM  Signature:  Santa GeneraAnne Cunningham Lead Social Worker LCSW 01/11/2014  10:49 AM    Scribe for Treatment Team:   Juline PatchQuylle Bj Morlock,  01/11/2014 10:49 AM

## 2014-01-11 NOTE — Plan of Care (Signed)
Problem: Ineffective individual coping Goal: LTG: Patient will report a decrease in negative feelings Outcome: Progressing Patient reports he is feeling "better" today. Goal: STG: Patient will remain free from self harm Outcome: Progressing Patient remains free from self harm. 15 minute checks completed per protocol for pt safety.

## 2014-01-11 NOTE — Plan of Care (Signed)
Problem: Ineffective individual coping Goal: STG: Patient will participate in after care plan Patient has attended groups and easily engaged in discussions. Outpatient follow up is scheduled with [place] and [place].  Zahlia Deshazer, LCSW 01/11/2014 12:47 PM Outcome: Completed/Met Date Met:  01/11/14

## 2014-01-11 NOTE — Plan of Care (Signed)
Problem: Alteration in mood Goal: LTG-Pt's behavior demonstrates decreased signs of depression Goal met. Patient is rating depression at three. Patient rated depression at eight on admission. Goal is for depression to be rated at three or below on discharge.  Burnis Medin Victoriano Campion, LCSW 12:49 PM    (Patient's behavior demonstrates decreased signs of depression to the point the patient is safe to return home and continue treatment in an outpatient setting)  Outcome: Completed/Met Date Met:  01/11/14

## 2014-01-11 NOTE — Progress Notes (Signed)
D: Patient is alert and oriented. Pt's mood and affect is "better," but remains sad and depressed. Pt minimally engages with RN. Pt denies SI/HI and AVH at this time. Pt remained in bed sleeping the majority of the day, but is attending some groups. A: Scheduled medications administered per providers orders (See MAR). 15 minute checks completed per protocol for pt safety. R: Pt cooperative and receptive to nursing interventions.

## 2014-01-12 DIAGNOSIS — F329 Major depressive disorder, single episode, unspecified: Secondary | ICD-10-CM

## 2014-01-12 DIAGNOSIS — F411 Generalized anxiety disorder: Secondary | ICD-10-CM

## 2014-01-12 NOTE — Progress Notes (Signed)
Patient ID: Mike Moran, male   DOB: [birth year], 58 y.o.   MRN: [id number] [facility] MD Progress Note  01/12/2014 2:52 PM [name]  MRN:  [id number]  Subjective:  "I feel a little shaky & I'm nauseated. I need to lie down  Objective:   I have discussed case with treatment team and met with patient.  He states he is feeling  Better.  He is improved compared to admission, and the severity of his depression and anxiety symptoms has decreased.  At this time he denies any medication side effects, and so far has tolerated Ativan/Effexor XR well. He has had no ongoing suicidal ideations , and behavior on unit has been in good control. He is going to some groups. Neuro-vegetative symptoms of depression are improving. Energy level still low, but Appetite is improving,   Diagnosis:  MDD, GAD  Total Time spent with patient: 25 minutes  ADL's:  fair  Sleep: patient describes as improving  Appetite:  Improved   Suicidal Ideation:  Currently denies any suicidal ideations Homicidal Ideation:  Currently denies any homicidal ideations AEB (as evidenced by):  Psychiatric Specialty Exam: Physical Exam  ROS  Blood pressure 116/62, pulse 91, temperature 98.5 F (36.9 C), temperature source Oral, resp. rate 14, height $RemoveBe'6\' 1"'xwoTuAVmd$  (1.854 m), weight 68.947 kg (152 lb).Body mass index is 20.06 kg/(m^2).  General Appearance: Fairly Groomed  Engineer, water::  Good  Speech:  Normal Rate  Volume:  Decreased  Mood:  less depressed, affect more reactive  Affect:  less constricted, more reactive  Thought Process:  Linear  Orientation:  Full (Time, Place, and Person)  Thought Content:  no hallucinations, no delusions  Suicidal Thoughts:  No at this time denies any thoughts of hurting self and  contracts for safety on unit   Homicidal Thoughts:  No  Memory:  Recent and remote grossly intact   Judgement:  Other:  improved  Insight:  Fair  Psychomotor Activity:  Normal  Concentration:  Good  Recall:  Good   Fund of Knowledge:Good  Language: Good  Akathisia:  Negative  Handed:  Right  AIMS (if indicated):     Assets:  Desire for Improvement Resilience Social Support  Sleep:  Number of Hours: 6.5   Musculoskeletal: Strength & Muscle Tone: within normal limits Gait & Station: normal Patient leans: N/A  Current Medications: Current Facility-Administered Medications  Medication Dose Route Frequency Provider Last Rate Last Dose  . acetaminophen (TYLENOL) tablet 650 mg  650 mg Oral Q6H PRN Laverle Hobby, PA-C      . aspirin EC tablet 81 mg  81 mg Oral Daily Laverle Hobby, PA-C   81 mg at 01/12/14 0844  . hydrOXYzine (ATARAX/VISTARIL) tablet 25 mg  25 mg Oral Q6H PRN Laverle Hobby, PA-C   25 mg at 01/12/14 0846  . LORazepam (ATIVAN) tablet 0.5 mg  0.5 mg Oral TID Jenne Campus, MD   0.5 mg at 01/12/14 1326  . magnesium hydroxide (MILK OF MAGNESIA) suspension 30 mL  30 mL Oral Daily PRN Laverle Hobby, PA-C      . traZODone (DESYREL) tablet 50 mg  50 mg Oral QHS,MR X 1 Laverle Hobby, PA-C   50 mg at 01/10/14 2201  . venlafaxine XR (EFFEXOR-XR) 24 hr capsule 75 mg  75 mg Oral Q breakfast Jenne Campus, MD   75 mg at 01/12/14 1950    Lab Results:  No results found for this or any previous  visit (from the past 48 hour(s)).  Physical Findings: AIMS: Facial and Oral Movements Muscles of Facial Expression: None, normal Lips and Perioral Area: None, normal Jaw: None, normal Tongue: None, normal, , Trunk Movements Neck, shoulders, hips: None, normal, Overall Severity Severity of abnormal movements (highest score from questions above): None, normal Incapacitation due to abnormal movements: None, normal Patient's awareness of abnormal movements (rate only patient's report): No Awareness, Dental Status Current problems with teeth and/or dentures?: Yes Does patient usually wear dentures?: Yes  CIWA:  CIWA-Ar Total: 0 COWS:  COWS Total Score: 0   Assessment: Patient is  improving- less severely depressed, exhibiting a fuller range of affect, not suicidal at present. Anxiety symptoms/excessive anxious ruminations also improved. Thus far tolerating Ativan and Effexor XR trial well.  Treatment Plan Summary: Daily contact with patient to assess and evaluate symptoms and progress in treatment Medication management See below  Plan: Continue inpatient treatment , milieu, support Continue Effexor XR 75 mgrs QDAY Continue Ativan 0.5  mgrs TID Continue Trazodone 50 mgrs QHS as needed for insomnia  Medical Decision Making Problem Points:  Established problem, stable/improving (1), Review of last therapy session (1) and Review of psycho-social stressors (1) Data Points:  Review or order clinical lab tests (1) Review of medication regiment & side effects (2)  I certify that inpatient services furnished can reasonably be expected to improve the patient's condition.   Lindell Spar I, PMHNP-BC 01/12/2014, 2:52 PM

## 2014-01-12 NOTE — Progress Notes (Signed)
Patient ID: Mike BlazingJohnny C Moran, male   DOB: [birth year], 58 y.o.   MRN: [id number] D: client visible on the unit, in dayroom watching TV, interacts with peers and staff appropriately. Client reports day has been "pretty good" "calm" "sleep good last night" A: Writer introduced self to client, reviewed medication available as needed for sleep, provided emotional support. Staff will monitor q4815min for safety. R: client is safe on the unit, declines sleep medication "I'm going to see if I can go to sleep on my on"

## 2014-01-12 NOTE — Plan of Care (Signed)
Problem: Alteration in mood Goal: LTG-Patient reports reduction in suicidal thoughts (Patient reports reduction in suicidal thoughts and is able to verbalize a safety plan for whenever patient is feeling suicidal)  Outcome: Progressing Client currently denies suicidal thoughts, will report to staff is any of these thoughts reoccur. Client reports he has support system if he has these self harm thoughts again.

## 2014-01-12 NOTE — Plan of Care (Signed)
Problem: Alteration in mood Goal: STG-Patient is able to discuss feelings and issues (Patient is able to discuss feelings and issues leading to depression)  Outcome: Progressing Client reports decreased symptoms of depression at "4" of 10. "feel pretty good" "slept good last night"

## 2014-01-12 NOTE — [facility] Group Notes (Signed)
[facility] LCSW Group Therapy 01/12/2014 10:00am  Type of Therapy and Topic: Group Therapy: Avoiding Self-Sabotaging and Enabling Behaviors   Pt did not attend, reason unknown.  Chad CordialLauren Carter, LCSWA 01/12/2014 1:15 PM

## 2014-01-12 NOTE — Progress Notes (Signed)
D.  Pt in bed on approach, did not attend evening AA group.  Minimal interaction.  Pt's wife visited earlier and spoke with Pt about his father's condition.  Wife later called and spoke to this staff member and reported that Pt's father is in hospice and has possibly less than 24 hours to live.  She would like the care team to be made aware of this.  She stated that she will phone Pt in AM and speak to him about any developments.   A.  Made note for care team regarding condition of Pt's father.  Support and encouragement offered to Pt.  R.  Pt remains safe in bed at this time, will continue to monitor.

## 2014-01-12 NOTE — Progress Notes (Signed)
Mike Moran has spent most of his day in his bed. Mike Moran is grumpy, irritable and Mike Moran refuses to engage in any conversation with this nurse.   A [facility] encouragement, Mike Moran got up and came to nurses' station to take his meds for morning and at lunchtime. Mike Moran has refused to complete his morning assessment and said "NO" when this nurse requested Mike Moran answer the question if I read them out loud.   R Mike Moran demonstrates lack of engagement in his recovery and acute depression.

## 2014-01-12 NOTE — Progress Notes (Signed)
.  Psychoeducational Group Note    Date: 01/12/2014 Time:  0930    Goal Setting Purpose of Group: To be able to set a goal that is measurable and that can be accomplished in one day  Participation Level:  Did not attend Mike Moran A  

## 2014-01-12 NOTE — Progress Notes (Signed)
Psychoeducational Group Note  Date: 01/12/2014 Time:  1315 Group Topic/Focus:  Identifying Needs:   The focus of this group is to help patients identify their personal needs that have been historically problematic and identify healthy behaviors to address their needs.  Participation Level:  Did not attendJudge, Claudell Rhody A 

## 2014-01-13 MED ORDER — ONDANSETRON 4 MG PO TBDP: 4.0000 mg | ORAL_TABLET | Freq: Three times a day (TID) | ORAL | Status: DC | PRN

## 2014-01-13 MED ORDER — ONDANSETRON 4 MG PO TBDP
4.0000 mg | ORAL_TABLET | Freq: Once | ORAL | Status: AC
  Administered 2014-01-13: 4 mg via ORAL
  Filled 2014-01-13 (×2): qty 1

## 2014-01-13 NOTE — Progress Notes (Signed)
Patient ID: Mike Moran, male   DOB: June 07, 1955, 58 y.o.   MRN: [id number] Patient ID: Mike Moran, male   DOB: June 07, 1955, 58 y.o.   MRN: [id number] [facility]  01/13/2014 3:18 PM Mike Moran  MRN:  [id number]  Subjective: Jonny reports that he is feeling some sadness today after learning of the death of his father. He says it was expected, however, had hoped that he will still be alive till he gets out of the hospital. He denies any SIHI, AVH  Objective:   I have discussed case with treatment team and met with patient.  He states he is feeling  Better.  He is improved compared to admission, and the severity of his depression and anxiety symptoms has decreased.  At this time he denies any medication side effects, and so far has tolerated Ativan/Effexor XR well. He has had no ongoing suicidal ideations , and behavior on unit has been in good control. He is going to some groups. Neuro-vegetative symptoms of depression are improving. Energy level still low, but Appetite is improving,   Diagnosis:  MDD, GAD  Total Time spent with patient: 25 minutes  ADL's:  fair  Sleep: patient describes as improving  Appetite:  Improved   Suicidal Ideation:  Currently denies any suicidal ideations Homicidal Ideation:  Currently denies any homicidal ideations AEB (as evidenced by):  Psychiatric Specialty Exam: Physical Exam  ROS  Blood pressure 90/54, pulse 97, temperature 98.7 F (37.1 C), temperature source Oral, resp. rate 20, height 6\' 1"  (1.854 m), weight 68.947 kg (152 lb).Body mass index is 20.06 kg/(m^2).  General Appearance: Fairly Groomed  Patent attorneyye Contact::  Good  Speech:  Normal Rate  Volume:  Decreased  Mood:  less depressed, affect more reactive  Affect:  less constricted, more reactive  Thought Process:  Linear  Orientation:  Full (Time, Place, and Person)  Thought Content:  no hallucinations, no delusions  Suicidal Thoughts:  No at this time denies any thoughts  of hurting self and  contracts for safety on unit   Homicidal Thoughts:  No  Memory:  Recent and remote grossly intact   Judgement:  Other:  improved  Insight:  Fair  Psychomotor Activity:  Normal  Concentration:  Good  Recall:  Good  Fund of Knowledge:Good  Language: Good  Akathisia:  Negative  Handed:  Right  AIMS (if indicated):     Assets:  Desire for Improvement Resilience Social Support  Sleep:  Number of Hours: 5.75   Musculoskeletal: Strength & Muscle Tone: within normal limits Gait & Station: normal Patient leans: N/A  Current Medications: Current Facility-Administered Medications  Medication Dose Route Frequency Provider Last Rate Last Dose  . acetaminophen (TYLENOL) tablet 650 mg  650 mg Oral Q6H PRN Kerry HoughSpencer E Simon, PA-C      . aspirin EC tablet 81 mg  81 mg Oral Daily Kerry HoughSpencer E Simon, PA-C   81 mg at 01/13/14 0831  . hydrOXYzine (ATARAX/VISTARIL) tablet 25 mg  25 mg Oral Q6H PRN Kerry HoughSpencer E Simon, PA-C   25 mg at 01/12/14 2133  . LORazepam (ATIVAN) tablet 0.5 mg  0.5 mg Oral TID Craige CottaFernando A Cobos, MD   0.5 mg at 01/13/14 1200  . magnesium hydroxide (MILK OF MAGNESIA) suspension 30 mL  30 mL Oral Daily PRN Kerry HoughSpencer E Simon, PA-C      . traZODone (DESYREL) tablet 50 mg  50 mg Oral QHS,MR X 1 Kerry HoughSpencer E Simon, PA-C   50  mg at 01/12/14 2133  . venlafaxine XR (EFFEXOR-XR) 24 hr capsule 75 mg  75 mg Oral Q breakfast Jenne Campus, MD   75 mg at 01/13/14 0831    Lab Results:  No results found for this or any previous visit (from the past 107 hour(s)).  Physical Findings: AIMS: Facial and Oral Movements Muscles of Facial Expression: None, normal Lips and Perioral Area: None, normal Jaw: None, normal Tongue: None, normal, , Trunk Movements Neck, shoulders, hips: None, normal, Overall Severity Severity of abnormal movements (highest score from questions above): None, normal Incapacitation due to abnormal movements: None, normal Patient's awareness of abnormal movements  (rate only patient's report): No Awareness, Dental Status Current problems with teeth and/or dentures?: Yes Does patient usually wear dentures?: Yes  CIWA:  CIWA-Ar Total: 0 COWS:  COWS Total Score: 0   Assessment: Patient is improving- less severely depressed, exhibiting a fuller range of affect, not suicidal at present. Anxiety symptoms/excessive anxious ruminations also improved. Thus far tolerating Ativan and Effexor XR trial well.  Treatment Plan Summary: Daily contact with patient to assess and evaluate symptoms and progress in treatment Medication management See below  Plan: Continue inpatient treatment , milieu, support Continue Effexor XR 75 mgrs QDAY Continue Ativan 0.5  mgrs TID Continue Trazodone 50 mgrs QHS as needed for insomnia. Monitor for changes in mood, father passed this am.  Medical Decision Making Problem Points:  Established problem, stable/improving (1), Review of last therapy session (1) and Review of psycho-social stressors (1) Data Points:  Review or order clinical lab tests (1) Review of medication regiment & side effects (2)  I certify that inpatient services furnished can reasonably be expected to improve the patient's condition.   Lindell Spar I, PMHNP-BC 01/13/2014, 3:18 PM

## 2014-01-13 NOTE — Progress Notes (Signed)
D.  Pt has been sad and depressed this evening due to father's death today.  After Pt's visitors left patient went to bed and was resting with eyes closed, respirations even and unlabored upon approach.  A.  Pt remains safe on unit  R.  Will continue to monitor.

## 2014-01-13 NOTE — Progress Notes (Addendum)
Pt is very depressed this am and very sad. He received a phone call from family stating his dad had died last pm. Pt did share his feelings with staff and then asked for some gingerale to settle his stomach. Pt does contract for safety and denies SI and HI. He stated he is thankful his dad lived to be 60 years old and all the family took care of him so he never had to go to a nursing home. Pt stated,"I think for now I would like just to stay in my room and rest."10am -Pt was given 4mg  of zofran ODT. 12noon-Pt states he did get relief from the zofran.

## 2014-01-13 NOTE — Progress Notes (Signed)
D [name] is mourning his father's death today and is appropriately sad and depressed. HE takes his scheduled meds and he remains appreciative and cooperative  With staff.   A He was given zofran earlier this morning for c/o nausea and stated relief and has had no further c/o today.   R In lieu of father's death and restrictions on number of visitors ( as well as the place and time they might visit ) this nurse spoke with CN CJ and AC TT and will relax visitation rules for pt to be 3 people may visit at a time and they may come before visitation starts today ( ie at 1730). Operator made aware and note place in pt's electronic chart

## 2014-01-13 NOTE — [facility] Group Notes (Signed)
[facility] Group Notes:  Healthy Life Skills  Date:  01/13/2014  Time:  2:52 PM  Type of Therapy:  Nurse Education  Participation Level:  Active  Participation Quality:  Appropriate  Affect:  Appropriate  Cognitive:  Appropriate  Insight:  Appropriate  Engagement in Group:  Engaged  Modes of Intervention:  Discussion  Summary of Progress/Problems:  Mike Moran, Mike Moran 01/13/2014, 2:52 PM

## 2014-01-13 NOTE — [facility] Group Notes (Signed)
[facility] Group Notes: relaxation  Date:  01/13/2014  Time:  11:45 AM  Type of Therapy:  Nurse Education  Participation Level:  Did Not Attend  Participation Quality:  Inattentive  Affect:  Depressed  Cognitive:  Lacking  Insight:  None  Engagement in Group:  None  Modes of Intervention:  Discussion  Summary of Progress/Problems:Pt did not attend  Rodman KeyWebb, Miko Markwood [facility] 01/13/2014, 11:45 AM

## 2014-01-13 NOTE — Progress Notes (Signed)
Pt did not attend AA speaker group. He slept in his room instead. He kept to himself all evening, however, he was appropriate and pleasant when spoken to.  Rosilyn MingsMingia, Anslie Spadafora A

## 2014-01-13 NOTE — [facility] Group Notes (Signed)
[facility] LCSW Group Therapy 01/13/2014 10:00am  Type of Therapy: Group Therapy- Feelings Around Discharge & Establishing a Supportive Framework  Pt did not attend, resting in room; Pt found out that father had passed away.   Chad CordialLauren Carter, LCSWA 01/13/2014 11:01 AM

## 2014-01-14 DIAGNOSIS — F411 Generalized anxiety disorder: Secondary | ICD-10-CM | POA: Insufficient documentation

## 2014-01-14 MED ORDER — HYDROXYZINE HCL 25 MG PO TABS: 25.0000 mg | ORAL_TABLET | Freq: Four times a day (QID) | ORAL | Status: DC | PRN

## 2014-01-14 MED ORDER — ASPIRIN EC 81 MG PO TBEC: 81.0000 mg | DELAYED_RELEASE_TABLET | Freq: Every day | ORAL | Status: DC

## 2014-01-14 MED ORDER — VENLAFAXINE HCL ER 75 MG PO CP24: 75.0000 mg | ORAL_CAPSULE | Freq: Every day | ORAL | Status: DC

## 2014-01-14 MED ORDER — LORAZEPAM 0.5 MG PO TABS
0.5000 mg | ORAL_TABLET | Freq: Three times a day (TID) | ORAL | Status: DC
Start: 2014-01-14 — End: 2014-01-24

## 2014-01-14 MED ORDER — TRAZODONE HCL 50 MG PO TABS: 50.0000 mg | ORAL_TABLET | Freq: Every evening | ORAL | Status: DC | PRN

## 2014-01-14 NOTE — [facility] Group Notes (Signed)
[facility]   01/14/2014 10:13 AM  Participation Quality:  Patient did not attend group.    Mike Moran, Joesph JulyQuylle Hairston

## 2014-01-14 NOTE — Progress Notes (Signed)
Psychoeducational Group Note  Date:  01/14/2014 Time:  0041  Group Topic/Focus:  Wrap-Up Group:   The focus of this group is to help patients review their daily goal of treatment and discuss progress on daily workbooks.  Participation Level: Did Not Attend  Participation Quality:  Not Applicable  Affect:  Not Applicable  Cognitive:  Not Applicable  Insight:  Not Applicable  Engagement in Group: Not Applicable  Additional Comments:  The patient did not attend group last evening.   Savahanna Almendariz S 01/14/2014, 12:41 AM

## 2014-01-14 NOTE — [facility] Suicide Risk Assessment (Signed)
Demographic Factors:  58 year old man , employed, lives at home with wife .  Total Time spent with patient: 30 minutes  Psychiatric Specialty Exam: Physical Exam  ROS  Blood pressure 98/57, pulse 94, temperature 98.8 F (37.1 C), temperature source Oral, resp. rate 20, height 6\' 1"  (1.854 m), weight 68.947 kg (152 lb).Body mass index is 20.06 kg/(m^2).  General Appearance: Well Groomed  Patent attorneyye Contact::  Good  Speech:  Normal Rate  Volume:  Normal  Mood:  patient states his mood is much improved, and currently denies severe depression. Affect is full in range  Affect:  Full Range- no longer presenting anxious.  Thought Process:  Goal Directed and Linear  Orientation:  Full (Time, Place, and Person)  Thought Content:  no hallucinations, no delusions  Suicidal Thoughts:  No  Homicidal Thoughts:  No  Memory:  recent and remote grossly intact   Judgement:  Good  Insight:  Present  Psychomotor Activity:  Normal  Concentration:  Good  Recall:  Good  Fund of Knowledge:Good  Language: Good  Akathisia:  Negative  Handed:  Right  AIMS (if indicated):     Assets:  Communication Skills Desire for Improvement Resilience Social Support Talents/Skills  Sleep:  Number of Hours: 6    Musculoskeletal: Strength & Muscle Tone: within normal limits Gait & Station: normal Patient leans: N/A   Mental Status Per Nursing Assessment::   On Admission:  Suicidal ideation indicated by patient  Current Mental Status by Physician: As noted, at this time patient is much improved compared to admission. He is presenting with improved mood , improved range of affect, no thought disorder, no SI or HI, no psychotic symptoms. Of note , he is tolerating medications well, and we have reviewed side effects, to include addictive potential and sedating potential of Ativan. Of note, patient's elderly father, aged 60, passed away recently - patient saddened by loss, but does not feel this is causing any  worsening depression and reports mood as much improved. Currently seems euthymic.  [place] [employer] [place].   Loss Factors: Family stressors, divisions between siblings.  Father passed away recently.  Historical Factors: History of anxiety, no history of prior psychiatric admissions, no history of suicide attempts.   Risk Reduction Factors:   Sense of responsibility to family, Employed, Living with another person, especially a relative, Positive social support and Positive coping skills or problem solving skills  Continued Clinical Symptoms:  As noted, patient currently much improved, with a full range of affect, decreased anxiety, improved mood. No SI or HI, future oriented. No psychotic symptoms. Tolerating prescribed medications well without side effects.  Cognitive Features That Contribute To Risk:  No gross cognitive deficits noted upon discharge. Is alert , attentive, and oriented x 3   Suicide Risk:  Mild:  Suicidal ideation of limited frequency, intensity, duration, and specificity.  There are no identifiable plans, no associated intent, mild dysphoria and related symptoms, good self-control (both objective and subjective assessment), few other risk factors, and identifiable protective factors, including available and accessible social support.  Discharge Diagnoses:   AXIS I:  MDD without psychotic features, single episode, GAD by history AXIS II:  Deferred AXIS III:   Past Medical History  Diagnosis Date  . Depression    AXIS IV:  problems related to social environment AXIS V: 70 upon discharge   Plan Of Care/Follow-up recommendations:  Activity:  As tolerated Diet:  Regular Other:  Tests: NA  See below  Is patient on multiple antipsychotic therapies at discharge:  No   Has Patient had three or more failed trials of antipsychotic monotherapy by history:  No  Recommended Plan for Multiple Antipsychotic Therapies: NA  Patient is  leaving unit in good spirits. Plans to return home . Plans to follow up with his PCP for outpatient medical management as needed- patient aware of elevated cholesterol and encouraged to discuss this with PCP for ongoing monitorization /management .  Plans to follow up as below-   Follow up with Dr[facility] Lolly MustacheArfeen [facility] [place] On 01/16/2014.    Why: Wednesday, January 16, 2014 at 8:30 AM. Please complete and bring registration packet. You will [place]   Contact information:   [address] [place], [place] [address]  [phone]      Follow up with Dr. Arlina Robesoddenbaugh [facility] [place] On 02/12/2014.   Why: You are scheduled with Dr. Arlina Robesoddenbaugh on Tuesday, February 12, 2013 at 1:15PM for counseling   Contact information:   [address] [place], [place] [address]   [phone]      Follow up with Dr. Tenny Crawoss [facility] [place] On 03/11/2014.   Why: You are scheduled with Dr. Tenny Crawoss on Monday March 11, 2014 at 1:30 PM   Contact information:   [address] [place], [place] [address]  [phone]     Nehemiah MassedCOBOS, FERNANDO 01/14/2014, 10:56 AM

## 2014-01-14 NOTE — Progress Notes (Addendum)
D/C instructions/meds/follow-up appointments reviewed, pt verbalized understanding, pt's belongings returned to pt, samples given. Denies SI/HI/AVH. 

## 2014-01-14 NOTE — Clinical Social Work Note (Signed)
CSW spoke with patient to offer condolescene in the death of his father.  Patient advised father's death was expected and he is okay with the death.  He is hopeful to discharge home today.

## 2014-01-14 NOTE — Discharge Summary (Signed)
Physician Discharge Summary Note  Patient:  Mike Moran is an 58 y.o., male MRN:  [id number] DOB:  1955/05/29 Patient phone:  [phone] (home)  [place]   [address] [place] [address],  Total Time spent with patient: Greater than 30 minutes  Date of Admission:  01/09/2014 Date of Discharge: 01/14/14  Reason for Admission:  Mood stabilization treatment  Discharge Diagnoses: Active Problems:   MDD (major depressive disorder), severe   GAD (generalized anxiety disorder)   Psychiatric Specialty Exam: Physical Exam  Psychiatric: His speech is normal and behavior is normal. Judgment and thought content normal. His mood appears not anxious. His affect is not angry, not blunt, not labile and not inappropriate. Cognition and memory are normal. He does not exhibit a depressed mood.    Review of Systems  Constitutional: Negative.   HENT: Negative.   Eyes: Negative.   Respiratory: Negative.   Cardiovascular: Negative.   Gastrointestinal: Negative.   Genitourinary: Negative.   Musculoskeletal: Negative.   Skin: Negative.   Neurological: Negative.   Endo/Heme/Allergies: Negative.   Psychiatric/Behavioral: Positive for depression (sTABLE). Negative for suicidal ideas, hallucinations, memory loss and substance abuse. The patient has insomnia (Stable). The patient is not nervous/anxious.     Blood pressure 98/57, pulse 94, temperature 98.8 F (37.1 C), temperature source Oral, resp. rate 20, height 6\' 1"  (1.854 m), weight 68.947 kg (152 lb).Body mass index is 20.06 kg/(m^2).  See Md's SRA                                                 Past Psychiatric History: Diagnosis: denies any prior history of depressive episodes, denies any history of mania or psychosis  Hospitalizations: This is his first psychiatric admission  Outpatient Care: His PCP has been prescribing medications  Substance Abuse Care: none   Self-Mutilation: No history of  self injurious behaviors   Suicidal Attempts: No prior history of suicide attempts   Violent Behaviors: Denies    Musculoskeletal: Strength & Muscle Tone: within normal limits Gait & Station: normal Patient leans: N/A  DSM5: Schizophrenia Disorders:  NA Obsessive-Compulsive Disorders:  NA Trauma-Stressor Disorders:  NA Substance/Addictive Disorders:  NA Depressive Disorders:  Major Depressive Disorder - Severe (296.23), Generalized anxiety disorder  Axis Diagnosis:  AXIS I:   Major Depressive Disorder - Severe (296.23), Generalized anxiety disorder AXIS II:  Deferred AXIS III:   Past Medical History  Diagnosis Date  . Depression    AXIS IV:  other psychosocial or environmental problems and Familial stressors, death of a loved one AXIS V:  4364  Level of Care:  OP  Hospital Course:  58 year old man, who presents with worsening depression. States that he has developed significant sadness, anhedonia, and suicidal ideations, to the point he recently put a gun to his head, although did not pull trigger. States he has been on Wellbutrin XL, prescribed by his PCP, but states " even though I take it regularly, it is not helping" States that several months ago his brother obtained power of attorney from his demented father , without consulting other family members. This resulted in significant family turmoil and patient severed his relationship with brother.  Mike Moran was admitted to the hospital for mood stabilization treatment. He got depressed due to familial issues (family disagreement & declining health of his father). It was the  accumulation of family issues that help worsened his depression. He stated on admission that he was taking Wellbutrin for his depression, which he thought was not helping as he found himself more depressed. Derl needed medication adjustment to re-stabilize his depressed mood.  While a patient in this hospital, Hildreth's Wellbutrin was discontinue and Effexor  XR was initiated. He was also medicated with Hydroxyzine 25 mg qid Prn for anxiety and Lorazepam 0.5 mg tid prn for anxiety. He was enrolled and participated in the group counseling sessions being offered and held on this unit. He learned coping skills. He was resumed on his Aspirin 81 mg for heart health. He presented no significant pre-existing medical issues that required treatments and or monitoring.    Olliver's mood is stabilized, although he learnt as of yesterday that his father has indeed passed. Although saddened, Mike Moran took the news well as stated that his father's passing was expected otherwise. Mike Moran is currently being discharged today to his home because his mood has stabilized and also he needed to help plan his father's funeral ceremony. He will follow-up care for medication management, counseling and routine psychiatric [facility]. He is provided with all the pertinent information required to make this appointment without problems.  Upon discharge, he adamantly denies any SIHI, AVH, delusional thoughts and or paranoia. He is provided with a 4 days worth, supply samples of his [facility]. [facility] all personal belongings in no apparent distress. Transportation per patient's arrangement.  Consults:  psychiatry  Significant Diagnostic Studies:  labs: CBC with diff, CMP, UDS, toxicology tests, U/A, results reviewed, stable  Discharge Vitals:   Blood pressure 98/57, pulse 94, temperature 98.8 F (37.1 C), temperature source Oral, resp. rate 20, height 6\' 1"  (1.854 m), weight 68.947 kg (152 lb). Body mass index is 20.06 kg/(m^2). Lab Results:   No results found for this or any previous visit (from the past 72 hour(s)).  Physical Findings: AIMS: Facial and Oral Movements Muscles of Facial Expression: None, normal Lips and Perioral Area: None, normal Jaw: None, normal Tongue: None, normal, , Trunk Movements Neck, shoulders, hips: None,  normal, Overall Severity Severity of abnormal movements (highest score from questions above): None, normal Incapacitation due to abnormal movements: None, normal Patient's awareness of abnormal movements (rate only patient's report): No Awareness, Dental Status Current problems with teeth and/or dentures?: Yes Does patient usually wear dentures?: Yes  CIWA:  CIWA-Ar Total: 0 COWS:  COWS Total Score: 0  Psychiatric Specialty Exam: See Psychiatric Specialty Exam and Suicide Risk Assessment completed by Attending Physician prior to discharge.  Discharge destination:  Home  Is patient on multiple antipsychotic therapies at discharge:  No   Has Patient had three or more failed trials of antipsychotic monotherapy by history:  No  Recommended Plan for Multiple Antipsychotic Therapies: NA    Medication List    STOP taking these medications        buPROPion 150 MG 24 hr tablet  Commonly known as:  WELLBUTRIN XL     MEGA MULTIVITAMIN FOR MEN Tabs     ondansetron 4 MG tablet  Commonly known as:  ZOFRAN      TAKE these medications      Indication   aspirin EC 81 MG tablet  Take 1 tablet (81 mg total) by mouth daily. For heart health   Indication:  For heart health     hydrOXYzine 25 MG tablet  Commonly known as:  ATARAX/VISTARIL  Take 1 tablet (25 mg total) by mouth every 6 (six) hours as needed for anxiety.   Indication:  Tension, Anxiety     LORazepam 0.5 MG tablet  Commonly known as:  ATIVAN  Take 1 tablet (0.5 mg total) by mouth 3 (three) times daily. For severe anxiety   Indication:  Feeling Anxious     traZODone 50 MG tablet  Commonly known as:  DESYREL  Take 1 tablet (50 mg total) by mouth at bedtime and may repeat dose one time if needed. For sleep   Indication:  Trouble Sleeping     venlafaxine XR 75 MG 24 hr capsule  Commonly known as:  EFFEXOR-XR  Take 1 capsule (75 mg total) by mouth daily with breakfast. For depression   Indication:  Major Depressive  Disorder       Follow-up Information    Follow up with Dr. Lolly Mustache - [facility] On 01/16/2014.   Why:  Wednesday, January 16, 2014 at 8:30 AM.  Please complete and bring registration packet.  You will follow up with Dr. Tenny Craw in [place] in February   Contact information:   [address] [place], [place]   [address]  [phone]      Follow up with Dr. Arlina Robes [facility] On 02/12/2014.   Why:  You are scheduled with Dr. Arlina Robes on Tuesday, February 12, 2013 at 1:15PM for counseling   Contact information:   [address] [place], [place]   [address]   [phone]      Follow up with Dr. Tenny Craw [facility] [place] On 03/11/2014.   Why:  You are scheduled with Dr. Tenny Craw on Monday March 11, 2014 at 1:30 PM   Contact information:   [address] [place], [place]   [address]  [phone]     Follow-up recommendations:  Activity:  As tolerated Diet: As recommended by your primary care doctor. Keep all scheduled follow-up appointments as recommended.  Comments:  Take all your medications as prescribed by your mental healthcare provider. Report any adverse effects and or reactions from your medicines to your outpatient provider promptly. Patient is instructed and cautioned to not engage in alcohol and or illegal drug use while on prescription medicines. In the event of worsening symptoms, patient is instructed to call the crisis hotline, 911 and or go to the nearest ED for appropriate evaluation and treatment of symptoms. Follow-up with your primary care provider for your other medical issues, concerns and or health care needs.   Total Discharge Time:  Greater than 30 minutes.  SignedSanjuana Kava, PMHNP-BC 01/14/2014, 12:15 PM   Patient seen, Suicide Assessment Completed.  Disposition Plan Reviewed

## 2014-01-14 NOTE — Progress Notes (Signed)
[facility] Discharge Plan :  Will you be returning to the same living situation after discharge: Yes,  Patient is returning home with family. At discharge, do you have transportation home?:Yes,  Patient will arrange transportation home. Do you have the ability to pay for your medications:Yes,  Patient can affored medications.  Release of information consent forms completed and in the chart;  Patient's signature needed at discharge.  Patient to Follow up at: Follow-up Information    Follow up with Dr. [facility]olly [facility]fe[facility] [place] On 01/16/2014.   Why:  Wednesday, January 16, 2014 at 8:30 AM.  Please complete and bring registration packet.  You will follow up with Dr. [place]enny Crawoss in [place] in February   Contact information:   [address] [place], [place]   [address]  [phone]      Follow up with Dr. Arlina Robesoddenbaugh [facility] [place] On 02/12/2014.   Why:  You are scheduled with Dr. Arlina Robesoddenbaugh on Tuesday, February 12, 2013 at 1:15PM for counseling   Contact information:   [address] [place], [place]   [address]   [phone]      Follow up with Dr. Tenny Crawoss [facility] [place] On 03/11/2014.   Why:  You are scheduled with Dr. Tenny Crawoss on Monday March 11, 2014 at 1:30 PM   Contact information:   [address] [place], [place]   [address]  [phone]      Patient denies SI/HI:   Patient no longer endorsing SI/HI or other thoughts of self harm.     Safety Planning and Suicide Prevention discussed: .Reviewed with all patients during discharge planning group   Alphia Behanna Hairston 01/14/2014, 10:18 AM

## 2014-01-16 ENCOUNTER — Ambulatory Visit (HOSPITAL_COMMUNITY): Payer: Self-pay | Admitting: Psychiatry

## 2014-01-16 NOTE — Progress Notes (Signed)
Patient Discharge Instructions:  Next Level Care Provider Has Access to the EMR, 01/16/14  Records [facility] to [facility] via [facility]/Epic access.  Jerelene ReddenSheena E Paxton, 01/16/2014, 3:36 PM

## 2014-01-24 ENCOUNTER — Ambulatory Visit (INDEPENDENT_AMBULATORY_CARE_PROVIDER_SITE_OTHER): Payer: 59 | Admitting: Psychiatry

## 2014-01-24 ENCOUNTER — Encounter (HOSPITAL_COMMUNITY): Payer: Self-pay | Admitting: Psychiatry

## 2014-01-24 VITALS — BP 130/73 | HR 78 | Ht 72.0 in | Wt 155.4 lb

## 2014-01-24 DIAGNOSIS — F331 Major depressive disorder, recurrent, moderate: Secondary | ICD-10-CM

## 2014-01-24 DIAGNOSIS — F411 Generalized anxiety disorder: Secondary | ICD-10-CM | POA: Diagnosis not present

## 2014-01-24 MED ORDER — TRAZODONE HCL 100 MG PO TABS: 100.0000 mg | ORAL_TABLET | Freq: Every evening | ORAL | Status: DC | PRN

## 2014-01-24 MED ORDER — LORAZEPAM 0.5 MG PO TABS: 0.5000 mg | ORAL_TABLET | Freq: Every day | ORAL | Status: DC

## 2014-01-24 MED ORDER — VENLAFAXINE HCL ER 150 MG PO CP24: 150.0000 mg | ORAL_CAPSULE | Freq: Every day | ORAL | Status: DC

## 2014-01-24 MED ORDER — HYDROXYZINE HCL 25 MG PO TABS
25.0000 mg | ORAL_TABLET | Freq: Three times a day (TID) | ORAL | Status: DC | PRN
Start: 2014-01-24 — End: 2014-03-05

## 2014-01-24 NOTE — Progress Notes (Signed)
[facility] Initial Assessment Note  Mike Moran [id number] 58 y.o.  01/24/2014 1:15 PM  Chief Complaint:  I was admitted to behavioral [facility] because of depression and having suicidal thoughts.  I'm feeling better but is still have difficulty sleeping.  History of Present Illness:  Patient is 58 year old Caucasian, employed married man who came for his follow-up appointment with his daughter.  Patient was recently discharged from [facility] on December 7.  He was admitted because of severe depression and anxiety and having suicidal thoughts and plan to kill himself.  Recently he put a gun to his head however he did not pull the trigger.  Patient told his depression getting worse in past few months.  He was very upset on his younger brother who obtained power of attorney from his demented father while his mother was in rehabilitation.  Patient told this caused a lot of turmoil in the family and he admitted taking a big toll on his emotions.  He also mention in September he quit smoking and also his teeth were pulled out.  He mention all these stressors caused worsening of the depression.  He was feeling isolated, withdrawn, having nervousness and severe anxiety.  He also felt hopeless and worthless and started to lose weight.  He went to see his primary care physician Dr. Armandina Gemma who tried initially Wellbutrin but later added Lexapro but his symptom continues to get worse.  He started to have crying spells, decreased energy, worrying and hopeless.  He was given also Ambien but it did not help his sleep and make him more tired.  During hospitalization his Wellbutrin was discontinued along with Lexapro and Ambien and he was started on Effexor, hydroxyzine, Ativan and trazodone.  He is feeling much better however he still have poor sleep and racing thoughts.  His daughter who is a surgical tech came with his appointment endorsed improvement in his depression and anxiety  symptoms.  However he is not taking Ativan every day because it is making him very sleepy and groggy.  He is taking hydroxyzine 25 mg up to 3 times a day.  He denies any paranoia, hallucination, aggression or violence.  He denies any mania or any psychosis.  He is feeling less anxious and denies any active or passive suicidal thoughts or homicidal thought.  He is relieved that legal issue is taken care off finally.  Patient's father passed last week and his brother came to attend funeral however there were no acute medication with other family members.  Patient told he has a good support from his family member and he is relieved that things are going very well.  Patient denies any side effects of medication other than Ativan causing sedation.  Patient also mention drinking beer on and off and on one occasion to smoke marijuana.  His UDS was positive for marijuana patient denies any regular use of cannabis or any other drugs.  Patient denies any PTSD symptoms, panic attack or any OCD symptoms.  He is scheduled to see Dr. Jefm Miles on January 5 and Dr. Harrington Challenger on February 2.  Suicidal Ideation: No Plan Formed: No Patient has means to carry out plan: No  Homicidal Ideation: No Plan Formed: No Patient has means to carry out plan: No  Past Psychiatric History/Hospitalization(s) Patient has psychiatric hospitalization in December 2015 because of severe depression and having suicidal thoughts and plan to kill himself.  He put the gun on his head but did not pull the  trigger.  He denies any other previous psychiatric inpatient treatment.  His primary care physician tried him on Wellbutrin, Lexapro and Ambien but he had a poor response.  He has taken Xanax and Valium 20 years ago when he had divorced from his first wife.  Patient denies any history of mania, psychosis, hallucination Anxiety: Yes Bipolar Disorder: No Depression: Yes Mania: No Psychosis: No Schizophrenia: No Personality Disorder:  No Hospitalization for psychiatric illness: Yes History of Electroconvulsive Shock Therapy: No Prior Suicide Attempts: No  Medical History; His primary care physician is Dr. Hilma Favors. He has high cholesterol.  Traumatic brain injury: Patient denies any history of traumatic brain injury.  Family History; Patient endorse mother has depression.  Education and Work History; Patient is a high Printmaker.  He is working as a Company secretary and he is staying with the same company for more than 25 years.    Psychosocial History; Patient born and raised in [place].  He married twice.  He has 2 daughter from his first marriage.  He lives with his wife and stepdaughter.  Patient told his wife, children and sister are very supportive.  Patient does not communicate with his younger brother who have pain power of attorney from his demented father.  Legal History; Patient denies any legal issues.  History Of Abuse; Patient denies any history of abuse. Substance Abuse History; Patient claims social drinking but denies any binge drinking, seizures, blackouts.  He smoked once marijuana.  Patient denies any history of illegal substance use.  Review of Systems: Psychiatric: Agitation: No Hallucination: No Depressed Mood: No Insomnia: Yes Hypersomnia: No Altered Concentration: No Feels Worthless: No Grandiose Ideas: No Belief In Special Powers: No New/Increased Substance Abuse: No Compulsions: No  Neurologic: Headache: No Seizure: No Paresthesias: No   Musculoskeletal: Strength & Muscle Tone: within normal limits Gait & Station: normal Patient leans: N/A   Outpatient Encounter Prescriptions as of 01/24/2014  Medication Sig  . aspirin EC 81 MG tablet Take 1 tablet (81 mg total) by mouth daily. For heart health  . hydrOXYzine (ATARAX/VISTARIL) 25 MG tablet Take 1 tablet (25 mg total) by mouth 3 (three) times daily as needed for anxiety.  Marland Kitchen LORazepam (ATIVAN) 0.5 MG  tablet Take 1 tablet (0.5 mg total) by mouth at bedtime. For severe anxiety  . traZODone (DESYREL) 100 MG tablet Take 1 tablet (100 mg total) by mouth at bedtime and may repeat dose one time if needed. For sleep  . venlafaxine XR (EFFEXOR-XR) 150 MG 24 hr capsule Take 1 capsule (150 mg total) by mouth daily with breakfast. For depression  . [DISCONTINUED] hydrOXYzine (ATARAX/VISTARIL) 25 MG tablet Take 1 tablet (25 mg total) by mouth every 6 (six) hours as needed for anxiety.  . [DISCONTINUED] LORazepam (ATIVAN) 0.5 MG tablet Take 1 tablet (0.5 mg total) by mouth 3 (three) times daily. For severe anxiety  . [DISCONTINUED] traZODone (DESYREL) 50 MG tablet Take 1 tablet (50 mg total) by mouth at bedtime and may repeat dose one time if needed. For sleep  . [DISCONTINUED] venlafaxine XR (EFFEXOR-XR) 75 MG 24 hr capsule Take 1 capsule (75 mg total) by mouth daily with breakfast. For depression    Recent Results (from the past 2160 hour(s))  Urinalysis, Routine w reflex microscopic     Status: None   Collection Time: 11/28/13  1:04 PM  Result Value Ref Range   Color, Urine YELLOW YELLOW   APPearance CLEAR CLEAR   Specific Gravity, Urine 1.010 1.005 -  1.030   pH 7.5 5.0 - 8.0   Glucose, UA NEGATIVE NEGATIVE mg/dL   Hgb urine dipstick NEGATIVE NEGATIVE   Bilirubin Urine NEGATIVE NEGATIVE   Ketones, ur NEGATIVE NEGATIVE mg/dL   Protein, ur NEGATIVE NEGATIVE mg/dL   Urobilinogen, UA 0.2 0.0 - 1.0 mg/dL   Nitrite NEGATIVE NEGATIVE   Leukocytes, UA NEGATIVE NEGATIVE    Comment: MICROSCOPIC NOT DONE ON URINES WITH NEGATIVE PROTEIN, BLOOD, LEUKOCYTES, NITRITE, OR GLUCOSE <1000 mg/dL.  Drug screen panel, emergency     Status: Abnormal   Collection Time: 11/28/13  1:04 PM  Result Value Ref Range   Opiates NONE DETECTED NONE DETECTED   Cocaine NONE DETECTED NONE DETECTED   Benzodiazepines NONE DETECTED NONE DETECTED   Amphetamines NONE DETECTED NONE DETECTED   Tetrahydrocannabinol POSITIVE (A) NONE  DETECTED   Barbiturates NONE DETECTED NONE DETECTED    Comment:        DRUG SCREEN FOR MEDICAL PURPOSES ONLY.  IF CONFIRMATION IS NEEDED FOR ANY PURPOSE, NOTIFY LAB WITHIN 5 DAYS.        LOWEST DETECTABLE LIMITS FOR URINE DRUG SCREEN Drug Class       Cutoff (ng/mL) Amphetamine      1000 Barbiturate      200 Benzodiazepine   474 Tricyclics       259 Opiates          300 Cocaine          300 THC              50  CBC with Differential     Status: Abnormal   Collection Time: 11/28/13  1:10 PM  Result Value Ref Range   WBC 8.5 4.0 - 10.5 K/uL   RBC 4.29 4.22 - 5.81 MIL/uL   Hemoglobin 13.4 13.0 - 17.0 g/dL   HCT 39.0 39.0 - 52.0 %   MCV 90.9 78.0 - 100.0 fL   MCH 31.2 26.0 - 34.0 pg   MCHC 34.4 30.0 - 36.0 g/dL   RDW 13.2 11.5 - 15.5 %   Platelets 336 150 - 400 K/uL   Neutrophils Relative % 78 (H) 43 - 77 %   Neutro Abs 6.6 1.7 - 7.7 K/uL   Lymphocytes Relative 14 12 - 46 %   Lymphs Abs 1.2 0.7 - 4.0 K/uL   Monocytes Relative 6 3 - 12 %   Monocytes Absolute 0.5 0.1 - 1.0 K/uL   Eosinophils Relative 1 0 - 5 %   Eosinophils Absolute 0.1 0.0 - 0.7 K/uL   Basophils Relative 1 0 - 1 %   Basophils Absolute 0.0 0.0 - 0.1 K/uL  Comprehensive metabolic panel     Status: Abnormal   Collection Time: 11/28/13  1:10 PM  Result Value Ref Range   Sodium 140 137 - 147 mEq/L   Potassium 3.9 3.7 - 5.3 mEq/L   Chloride 102 96 - 112 mEq/L   CO2 27 19 - 32 mEq/L   Glucose, Bld 101 (H) 70 - 99 mg/dL   BUN 13 6 - 23 mg/dL   Creatinine, Ser 0.73 0.50 - 1.35 mg/dL   Calcium 9.5 8.4 - 10.5 mg/dL   Total Protein 7.2 6.0 - 8.3 g/dL   Albumin 4.1 3.5 - 5.2 g/dL   AST 17 0 - 37 U/L   ALT 15 0 - 53 U/L   Alkaline Phosphatase 45 39 - 117 U/L   Total Bilirubin 0.4 0.3 - 1.2 mg/dL   GFR calc non Af Amer >  90 >90 mL/min   GFR calc Af Amer >90 >90 mL/min    Comment: (NOTE) The eGFR has been calculated using the CKD EPI equation. This calculation has not been validated in all clinical  situations. eGFR's persistently <90 mL/min signify possible Chronic Kidney Disease.   Anion gap 11 5 - 15  Urinalysis, Routine w reflex microscopic     Status: None   Collection Time: 01/09/14 12:39 PM  Result Value Ref Range   Color, Urine YELLOW YELLOW   APPearance CLEAR CLEAR   Specific Gravity, Urine 1.025 1.005 - 1.030   pH 6.5 5.0 - 8.0   Glucose, UA NEGATIVE NEGATIVE mg/dL   Hgb urine dipstick NEGATIVE NEGATIVE   Bilirubin Urine NEGATIVE NEGATIVE   Ketones, ur NEGATIVE NEGATIVE mg/dL   Protein, ur NEGATIVE NEGATIVE mg/dL   Urobilinogen, UA 0.2 0.0 - 1.0 mg/dL   Nitrite NEGATIVE NEGATIVE   Leukocytes, UA NEGATIVE NEGATIVE    Comment: MICROSCOPIC NOT DONE ON URINES WITH NEGATIVE PROTEIN, BLOOD, LEUKOCYTES, NITRITE, OR GLUCOSE <1000 mg/dL.  CBC with Differential     Status: Abnormal   Collection Time: 01/09/14 12:40 PM  Result Value Ref Range   WBC 11.9 (H) 4.0 - 10.5 K/uL   RBC 4.48 4.22 - 5.81 MIL/uL   Hemoglobin 14.1 13.0 - 17.0 g/dL   HCT 98.5 11.0 - 08.3 %   MCV 91.1 78.0 - 100.0 fL   MCH 31.5 26.0 - 34.0 pg   MCHC 34.6 30.0 - 36.0 g/dL   RDW 87.8 28.0 - 76.6 %   Platelets 361 150 - 400 K/uL   Neutrophils Relative % 81 (H) 43 - 77 %   Neutro Abs 9.7 (H) 1.7 - 7.7 K/uL   Lymphocytes Relative 12 12 - 46 %   Lymphs Abs 1.4 0.7 - 4.0 K/uL   Monocytes Relative 6 3 - 12 %   Monocytes Absolute 0.7 0.1 - 1.0 K/uL   Eosinophils Relative 1 0 - 5 %   Eosinophils Absolute 0.1 0.0 - 0.7 K/uL   Basophils Relative 0 0 - 1 %   Basophils Absolute 0.1 0.0 - 0.1 K/uL  Comprehensive metabolic panel     Status: Abnormal   Collection Time: 01/09/14 12:40 PM  Result Value Ref Range   Sodium 138 137 - 147 mEq/L   Potassium 4.5 3.7 - 5.3 mEq/L   Chloride 100 96 - 112 mEq/L   CO2 26 19 - 32 mEq/L   Glucose, Bld 113 (H) 70 - 99 mg/dL   BUN 13 6 - 23 mg/dL   Creatinine, Ser 6.62 0.50 - 1.35 mg/dL   Calcium 9.8 8.4 - 31.2 mg/dL   Total Protein 7.4 6.0 - 8.3 g/dL   Albumin 3.9 3.5 -  5.2 g/dL   AST 17 0 - 37 U/L   ALT 13 0 - 53 U/L   Alkaline Phosphatase 67 39 - 117 U/L   Total Bilirubin 0.4 0.3 - 1.2 mg/dL   GFR calc non Af Amer >90 >90 mL/min   GFR calc Af Amer >90 >90 mL/min    Comment: (NOTE) The eGFR has been calculated using the CKD EPI equation. This calculation has not been validated in all clinical situations. eGFR's persistently <90 mL/min signify possible Chronic Kidney Disease.    Anion gap 12 5 - 15  Ethanol     Status: None   Collection Time: 01/09/14 12:40 PM  Result Value Ref Range   Alcohol, Ethyl (B) <11 0 - 11 mg/dL  Comment:        LOWEST DETECTABLE LIMIT FOR SERUM ALCOHOL IS 11 mg/dL FOR MEDICAL PURPOSES ONLY   Drug screen panel, emergency     Status: Abnormal   Collection Time: 01/09/14 12:40 PM  Result Value Ref Range   Opiates NONE DETECTED NONE DETECTED   Cocaine NONE DETECTED NONE DETECTED   Benzodiazepines NONE DETECTED NONE DETECTED   Amphetamines NONE DETECTED NONE DETECTED   Tetrahydrocannabinol POSITIVE (A) NONE DETECTED   Barbiturates NONE DETECTED NONE DETECTED    Comment:        DRUG SCREEN FOR MEDICAL PURPOSES ONLY.  IF CONFIRMATION IS NEEDED FOR ANY PURPOSE, NOTIFY LAB WITHIN 5 DAYS.        LOWEST DETECTABLE LIMITS FOR URINE DRUG SCREEN Drug Class       Cutoff (ng/mL) Amphetamine      1000 Barbiturate      200 Benzodiazepine   361 Tricyclics       443 Opiates          300 Cocaine          300 THC              50   Salicylate level     Status: Abnormal   Collection Time: 01/09/14 12:40 PM  Result Value Ref Range   Salicylate Lvl 1.0 (L) 2.8 - 20.0 mg/dL  TSH     Status: None   Collection Time: 01/10/14  6:19 AM  Result Value Ref Range   TSH 2.090 0.350 - 4.500 uIU/mL    Comment: Performed at [facility]  Lipid panel, fasting     Status: Abnormal   Collection Time: 01/10/14  6:19 AM  Result Value Ref Range   Cholesterol 237 (H) 0 - 200 mg/dL   Triglycerides 102 <150 mg/dL   HDL 57 >39 mg/dL    Total CHOL/HDL Ratio 4.2 RATIO   VLDL 20 0 - 40 mg/dL   LDL Cholesterol 160 (H) 0 - 99 mg/dL    Comment:        Total Cholesterol/HDL:CHD Risk Coronary Heart Disease Risk Table                     Men   Women  1/2 Average Risk   3.4   3.3  Average Risk       5.0   4.4  2 X Average Risk   9.6   7.1  3 X Average Risk  23.4   11.0        Use the calculated Patient Ratio above and the CHD Risk Table to determine the patient's CHD Risk.        ATP III CLASSIFICATION (LDL):  <100     mg/dL   Optimal  100-129  mg/dL   Near or Above                    Optimal  130-159  mg/dL   Borderline  160-189  mg/dL   High  >190     mg/dL   Very High Performed at [facility]   Hemoglobin A1c     Status: None   Collection Time: 01/10/14  6:19 AM  Result Value Ref Range   Hgb A1c MFr Bld 5.6 <5.7 %    Comment: (NOTE)  According to the ADA Clinical Practice Recommendations for 2011, when HbA1c is used as a screening test:  >=6.5%   Diagnostic of Diabetes Mellitus           (if abnormal result is confirmed) 5.7-6.4%   Increased risk of developing Diabetes Mellitus References:Diagnosis and Classification of Diabetes Mellitus,Diabetes VELF,8101,75(ZWCHE 1):S62-S69 and Standards of Medical Care in         Diabetes - 2011,Diabetes NIDP,8242,35 (Suppl 1):S11-S61.    Mean Plasma Glucose 114 <117 mg/dL    Comment: Performed at [facility]      Constitutional:  BP 130/73 mmHg  Pulse 78  Ht 6' (1.829 m)  Wt 155 lb 6.4 oz (70.489 kg)  BMI 21.07 kg/m2   Mental Status Examination;  Patient is casually dressed and fairly groomed.  He appears anxious and nervous.  His his speech is fast, clear and coherent.  His thought processes logical and goal-directed.  He described his mood nervous and anxious and his affect is constricted.  He denies any auditory or visual hallucination.  He denies any active or passive  suicidal thoughts or homicidal thought.  His attention and concentration is fair.  There were no delusions, paranoia or any obsessive thoughts.  His fund of knowledge is good.  His psychomotor activity is slightly increased.  There were no tremors, shakes or any EPS.  His cognition is good.  There were no flight of ideas or any loose association.  His insight judgment and impulse control is okay.   New problem, with additional work up planned, Review of Psycho-Social Stressors (1), Review or order clinical lab tests (1), Decision to obtain old records (1), Review and summation of old records (2), Established Problem, Worsening (2), Review of Medication Regimen & Side Effects (2) and Review of New Medication or Change in Dosage (2)  Assessment: Axis I: Major depressive disorder, recurrent.  Generalized anxiety disorder  Axis II: Deferred  Axis III:  Past Medical History  Diagnosis Date  . Depression      Plan:  I review his symptoms, history, blood work results and current medication.  Patient is still has residual symptoms of anxiety and nervousness.  He also complaining of poor sleep.  He tried Ativan but is making him very sleepy.  I recommended to take Ativan 0.5 mg at bedtime only with increase trazodone 100 mg at bedtime.  I will also increase Effexor one 50 mg daily to help the anxiety symptoms.  Recommended to take hydroxyzine 25 mg twice a day as needed for see what anxiety.  Patient is scheduled to see Dr. Jefm Miles on January 73fth.  Encouraged to keep appointment.  Discussed medication side effects, benefits in detail.  I will see him again in 3-4 weeks and then patient will see Dr. Harrington Challenger in [place]. Time spent 55 minutes.  More than 50% of the time spent in psychoeducation, counseling and coordination of care.  Discuss safety plan that anytime having active suicidal thoughts or homicidal thoughts then patient need to call 911 or go to the local emergency room.    ARFEEN,SYED T.,  MD 01/24/2014

## 2014-02-12 ENCOUNTER — Ambulatory Visit (HOSPITAL_COMMUNITY): Payer: Self-pay | Admitting: Psychiatry

## 2014-02-12 ENCOUNTER — Ambulatory Visit (INDEPENDENT_AMBULATORY_CARE_PROVIDER_SITE_OTHER): Payer: 59 | Admitting: Psychology

## 2014-02-22 ENCOUNTER — Other Ambulatory Visit (HOSPITAL_COMMUNITY): Payer: Self-pay | Admitting: Psychiatry

## 2014-02-22 DIAGNOSIS — F331 Major depressive disorder, recurrent, moderate: Secondary | ICD-10-CM

## 2014-02-25 NOTE — Telephone Encounter (Signed)
Refill of Trazadone and Effexor approved by Dr. Lolly MustacheArfeen 1 time until patient sees Dr. Tenny Crawoss 03/11/14

## 2014-03-05 ENCOUNTER — Ambulatory Visit (INDEPENDENT_AMBULATORY_CARE_PROVIDER_SITE_OTHER): Payer: 59 | Admitting: Psychiatry

## 2014-03-05 ENCOUNTER — Encounter (HOSPITAL_COMMUNITY): Payer: Self-pay | Admitting: Psychiatry

## 2014-03-05 VITALS — BP 107/71 | HR 71 | Ht 72.0 in | Wt 160.0 lb

## 2014-03-05 DIAGNOSIS — F411 Generalized anxiety disorder: Secondary | ICD-10-CM

## 2014-03-05 DIAGNOSIS — F331 Major depressive disorder, recurrent, moderate: Secondary | ICD-10-CM

## 2014-03-05 DIAGNOSIS — F339 Major depressive disorder, recurrent, unspecified: Secondary | ICD-10-CM

## 2014-03-05 MED ORDER — TRAZODONE HCL 100 MG PO TABS: ORAL_TABLET | ORAL | Status: DC

## 2014-03-05 MED ORDER — VENLAFAXINE HCL ER 150 MG PO CP24: ORAL_CAPSULE | ORAL | Status: DC

## 2014-03-05 NOTE — Progress Notes (Signed)
[facility] [phone] Progress Note   Mike Moran [id number] 59 y.o.  03/05/2014 10:38 AM  Chief Complaint:  I am doing better.  I stop taking Ativan and Vistaril.   History of Present Illness:  Mike Moran came for his follow-up appointment.  He was seen first time on December 17.  He was referred from inpatient psychiatric treatment.  He was discharged on multiple medication .  We recommended him to increase Effexor and stop using Ativan every day and Vistaril only if needed.  His trazodone was increased because he was complaining of poor sleep.  He is feeling much better.  Today he came with his wife.  He endorsed improvement in his sleep and depression.  He denies any crying spells or any panic attack.  He denies any active or passive suicidal thoughts or homicidal thought.  He mentioned his family issues also getting better.  He has a good support from his family member and he believed that things are going very well.  Patient denies any side effects of medication.  He denies any feeling of hopelessness or worthlessness.  He was scheduled to see Dr. Jefm Miles however he has decided not to see counselor because his symptoms are getting better.  Patient is scheduled to see Dr. Harrington Challenger on February 2.  Patient denies drinking or using any illegal substances.  He has no concern with the medication and denies any tremors or shakes.  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  His appetite is okay.  His vitals are stable.    Suicidal Ideation: No Plan Formed: No Patient has means to carry out plan: No  Homicidal Ideation: No Plan Formed: No Patient has means to carry out plan: No  Past Psychiatric History/Hospitalization(s) Patient has psychiatric hospitalization in December 2015 because of severe depression and having suicidal thoughts and plan to kill himself.   He put the gun on his head but did not pull the trigger.  He denies any other previous psychiatric inpatient treatment.  His primary care physician tried him on Wellbutrin, Lexapro and Ambien but he had a poor response.  He has taken Xanax and Valium 20 years ago when he had divorced from his first wife.  Patient denies any history of mania, psychosis, hallucination Anxiety: Yes Bipolar Disorder: No Depression: Yes Mania: No Psychosis: No Schizophrenia: No Personality Disorder: No Hospitalization for psychiatric illness: Yes History of Electroconvulsive Shock Therapy: No Prior Suicide Attempts: No  Medical History; His primary care physician is Dr. Hilma Favors. He has high cholesterol.  Review of Systems  Constitutional: Negative.   Skin: Negative.   Neurological: Negative.   Psychiatric/Behavioral: Negative for suicidal ideas and substance abuse. The patient is nervous/anxious.     Psychiatric: Agitation: No Hallucination: No Depressed Mood: No Insomnia: No Hypersomnia: No Altered Concentration: No Feels Worthless: No Grandiose Ideas: No Belief In Special Powers: No New/Increased Substance Abuse: No Compulsions: No  Neurologic: Headache: No Seizure: No Paresthesias: No   Musculoskeletal: Strength & Muscle Tone: within normal limits Gait & Station: normal Patient leans: N/A   Outpatient Encounter Prescriptions as of 03/05/2014  Medication Sig  . aspirin EC 81 MG tablet Take 1 tablet (81 mg total) by mouth daily. For heart health  . traZODone (DESYREL) 100 MG tablet TAKE ONE TABLET BY MOUTH AT BEDTIME.  Marland Kitchen venlafaxine  XR (EFFEXOR-XR) 150 MG 24 hr capsule TAKE ONE CAPSULE BY MOUTH DAILY WITH BREAKFAST FOR DEPRESSION.  . [DISCONTINUED] traZODone (DESYREL) 100 MG tablet TAKE ONE TABLET BY MOUTH AT BEDTIME. MAY REPEAT DOSE ONE TIME IF NEEDED.  . [DISCONTINUED] venlafaxine XR (EFFEXOR-XR) 150 MG 24 hr capsule TAKE ONE CAPSULE BY MOUTH DAILY WITH BREAKFAST FOR DEPRESSION.  .  [DISCONTINUED] hydrOXYzine (ATARAX/VISTARIL) 25 MG tablet Take 1 tablet (25 mg total) by mouth 3 (three) times daily as needed for anxiety.  . [DISCONTINUED] LORazepam (ATIVAN) 0.5 MG tablet Take 1 tablet (0.5 mg total) by mouth at bedtime. For severe anxiety    Recent Results (from the past 2160 hour(s))  Urinalysis, Routine w reflex microscopic     Status: None   Collection Time: 01/09/14 12:39 PM  Result Value Ref Range   Color, Urine YELLOW YELLOW   APPearance CLEAR CLEAR   Specific Gravity, Urine 1.025 1.005 - 1.030   pH 6.5 5.0 - 8.0   Glucose, UA NEGATIVE NEGATIVE mg/dL   Hgb urine dipstick NEGATIVE NEGATIVE   Bilirubin Urine NEGATIVE NEGATIVE   Ketones, ur NEGATIVE NEGATIVE mg/dL   Protein, ur NEGATIVE NEGATIVE mg/dL   Urobilinogen, UA 0.2 0.0 - 1.0 mg/dL   Nitrite NEGATIVE NEGATIVE   Leukocytes, UA NEGATIVE NEGATIVE    Comment: MICROSCOPIC NOT DONE ON URINES WITH NEGATIVE PROTEIN, BLOOD, LEUKOCYTES, NITRITE, OR GLUCOSE <1000 mg/dL.  CBC with Differential     Status: Abnormal   Collection Time: 01/09/14 12:40 PM  Result Value Ref Range   WBC 11.9 (H) 4.0 - 10.5 K/uL   RBC 4.48 4.22 - 5.81 MIL/uL   Hemoglobin 14.1 13.0 - 17.0 g/dL   HCT 40.8 39.0 - 52.0 %   MCV 91.1 78.0 - 100.0 fL   MCH 31.5 26.0 - 34.0 pg   MCHC 34.6 30.0 - 36.0 g/dL   RDW 13.2 11.5 - 15.5 %   Platelets 361 150 - 400 K/uL   Neutrophils Relative % 81 (H) 43 - 77 %   Neutro Abs 9.7 (H) 1.7 - 7.7 K/uL   Lymphocytes Relative 12 12 - 46 %   Lymphs Abs 1.4 0.7 - 4.0 K/uL   Monocytes Relative 6 3 - 12 %   Monocytes Absolute 0.7 0.1 - 1.0 K/uL   Eosinophils Relative 1 0 - 5 %   Eosinophils Absolute 0.1 0.0 - 0.7 K/uL   Basophils Relative 0 0 - 1 %   Basophils Absolute 0.1 0.0 - 0.1 K/uL  Comprehensive metabolic panel     Status: Abnormal   Collection Time: 01/09/14 12:40 PM  Result Value Ref Range   Sodium 138 137 - 147 mEq/L   Potassium 4.5 3.7 - 5.3 mEq/L   Chloride 100 96 - 112 mEq/L   CO2 26 19 -  32 mEq/L   Glucose, Bld 113 (H) 70 - 99 mg/dL   BUN 13 6 - 23 mg/dL   Creatinine, Ser 0.68 0.50 - 1.35 mg/dL   Calcium 9.8 8.4 - 10.5 mg/dL   Total Protein 7.4 6.0 - 8.3 g/dL   Albumin 3.9 3.5 - 5.2 g/dL   AST 17 0 - 37 U/L   ALT 13 0 - 53 U/L   Alkaline Phosphatase 67 39 - 117 U/L   Total Bilirubin 0.4 0.3 - 1.2 mg/dL   GFR calc non Af Amer >90 >90 mL/min   GFR calc Af Amer >90 >90 mL/min    Comment: (NOTE) The eGFR has been calculated using the  CKD EPI equation. This calculation has not been validated in all clinical situations. eGFR's persistently <90 mL/min signify possible Chronic Kidney Disease.    Anion gap 12 5 - 15  Ethanol     Status: None   Collection Time: 01/09/14 12:40 PM  Result Value Ref Range   Alcohol, Ethyl (B) <11 0 - 11 mg/dL    Comment:        LOWEST DETECTABLE LIMIT FOR SERUM ALCOHOL IS 11 mg/dL FOR MEDICAL PURPOSES ONLY   Drug screen panel, emergency     Status: Abnormal   Collection Time: 01/09/14 12:40 PM  Result Value Ref Range   Opiates NONE DETECTED NONE DETECTED   Cocaine NONE DETECTED NONE DETECTED   Benzodiazepines NONE DETECTED NONE DETECTED   Amphetamines NONE DETECTED NONE DETECTED   Tetrahydrocannabinol POSITIVE (A) NONE DETECTED   Barbiturates NONE DETECTED NONE DETECTED    Comment:        DRUG SCREEN FOR MEDICAL PURPOSES ONLY.  IF CONFIRMATION IS NEEDED FOR ANY PURPOSE, NOTIFY LAB WITHIN 5 DAYS.        LOWEST DETECTABLE LIMITS FOR URINE DRUG SCREEN Drug Class       Cutoff (ng/mL) Amphetamine      1000 Barbiturate      200 Benzodiazepine   810 Tricyclics       175 Opiates          300 Cocaine          300 THC              50   Salicylate level     Status: Abnormal   Collection Time: 01/09/14 12:40 PM  Result Value Ref Range   Salicylate Lvl 1.0 (L) 2.8 - 20.0 mg/dL  TSH     Status: None   Collection Time: 01/10/14  6:19 AM  Result Value Ref Range   TSH 2.090 0.350 - 4.500 uIU/mL    Comment: Performed at [facility]  Lipid panel, fasting     Status: Abnormal   Collection Time: 01/10/14  6:19 AM  Result Value Ref Range   Cholesterol 237 (H) 0 - 200 mg/dL   Triglycerides 102 <150 mg/dL   HDL 57 >39 mg/dL   Total CHOL/HDL Ratio 4.2 RATIO   VLDL 20 0 - 40 mg/dL   LDL Cholesterol 160 (H) 0 - 99 mg/dL    Comment:        Total Cholesterol/HDL:CHD Risk Coronary Heart Disease Risk Table                     Men   Women  1/2 Average Risk   3.4   3.3  Average Risk       5.0   4.4  2 X Average Risk   9.6   7.1  3 X Average Risk  23.4   11.0        Use the calculated Patient Ratio above and the CHD Risk Table to determine the patient's CHD Risk.        ATP III CLASSIFICATION (LDL):  <100     mg/dL   Optimal  100-129  mg/dL   Near or Above                    Optimal  130-159  mg/dL   Borderline  160-189  mg/dL   High  >190     mg/dL   Very High Performed at [facility]  Hemoglobin A1c     Status: None   Collection Time: 01/10/14  6:19 AM  Result Value Ref Range   Hgb A1c MFr Bld 5.6 <5.7 %    Comment: (NOTE)                                                                       According to the ADA Clinical Practice Recommendations for 2011, when HbA1c is used as a screening test:  >=6.5%   Diagnostic of Diabetes Mellitus           (if abnormal result is confirmed) 5.7-6.4%   Increased risk of developing Diabetes Mellitus References:Diagnosis and Classification of Diabetes Mellitus,Diabetes [id number],8022,33(KPQAE 1):S62-S69 and Standards of Medical Care in         Diabetes - 2011,Diabetes Care,2011,34 (Suppl 1):S11-S61.    Mean Plasma Glucose 114 <117 mg/dL    Comment: Performed at [facility]      Constitutional:  BP 107/71 mmHg  Pulse 71  Ht 6' (1.829 m)  Wt 160 lb (72.576 kg)  BMI 21.70 kg/m2   Mental Status Examination;  Patient is casually dressed and fairly groomed.  He is pleasant and cooperative.  He maintained good eye contact.  His his speech is  fast, clear and coherent.  His thought processes logical and goal-directed.  He described his mood good and his affect is improved from the past.  He denies any auditory or visual hallucination.  He denies any active or passive suicidal thoughts or homicidal thought.  His attention and concentration is fair.  There were no delusions, paranoia or any obsessive thoughts.  His fund of knowledge is good.  His psychomotor activity is normal. There were no tremors, shakes or any EPS.  His cognition is good.  There were no flight of ideas or any loose association.  His insight judgment and impulse control is okay.   Established Problem, Stable/Improving (1), Review of Psycho-Social Stressors (1), Review and summation of old records (2), Review of Last Therapy Session (1), Review of Medication Regimen & Side Effects (2) and Review of New Medication or Change in Dosage (2)  Assessment: Axis I: Major depressive disorder, recurrent.  Generalized anxiety disorder  Axis II: Deferred  Axis III:  Past Medical History  Diagnosis Date  . Depression      Plan:  Reassurance given.  I will discontinue Ativan and Vistaril since patient is no longer taking it.  Patient is wondering that how long he has to take Effexor and trazodone.  Discussed long-term prognosis and need of medication in detail.  Patient agreed with the plan.  He will see Dr. Harrington Challenger next week as patient lives in [place].  He is not interested in counseling.  Recommended to call us back if he has any further question otherwise he will follow-up with Dr. Harrington Challenger in [place].  Continue Effexor 150 mg daily and trazodone 100 mg at bedtime.  I recommended to take half trazodone if needed. Time spent 25 minutes.  More than 50% of the time spent in psychoeducation, counseling and coordination of care.  Discuss safety plan that anytime having active suicidal thoughts or homicidal thoughts then patient need to call 911 or go to the local emergency  room.  Redina Zeller T., MD 03/05/2014

## 2014-03-11 ENCOUNTER — Ambulatory Visit (INDEPENDENT_AMBULATORY_CARE_PROVIDER_SITE_OTHER): Payer: 59 | Admitting: Psychiatry

## 2014-03-11 ENCOUNTER — Encounter (HOSPITAL_COMMUNITY): Payer: Self-pay | Admitting: Psychiatry

## 2014-03-11 VITALS — BP 132/81 | HR 65 | Ht 72.0 in | Wt 159.8 lb

## 2014-03-11 DIAGNOSIS — F411 Generalized anxiety disorder: Secondary | ICD-10-CM

## 2014-03-11 DIAGNOSIS — F331 Major depressive disorder, recurrent, moderate: Secondary | ICD-10-CM

## 2014-03-11 MED ORDER — VENLAFAXINE HCL ER 150 MG PO CP24: ORAL_CAPSULE | ORAL | Status: DC

## 2014-03-11 MED ORDER — TRAZODONE HCL 100 MG PO TABS: ORAL_TABLET | ORAL | Status: DC

## 2014-03-11 NOTE — Progress Notes (Signed)
Patient ID: Mike Moran, male   DOB: 28-Jun-1955, 59 y.o.   MRN: [id number]  [facility] [id number] Progress Note   CLELL TRAHAN [id number] 59 y.o.  03/11/2014 2:13 PM  Chief Complaint:  I am doing better. I'm back to myself  History of Present Illness:  Mike Moran came for his follow-up appointment.  He was seen first time on December 17.  He was referred from inpatient psychiatric treatment.  He was discharged on multiple medication .  We recommended him to increase Effexor and stop using Ativan every day and Vistaril only if needed.  His trazodone was increased because he was complaining of poor sleep.  He is feeling much better.  Today he came with his wife.  He endorsed improvement in his sleep and depression.  He denies any crying spells or any panic attack.  He denies any active or passive suicidal thoughts or homicidal thought.  He mentioned his family issues also getting better.  He has a good support from his family member and he believed that things are going very well.  Patient denies any side effects of medication.  He denies any feeling of hopelessness or worthlessness.  He was scheduled to see Dr. Shelva Majestic however he has decided not to see counselor because his symptoms are getting better.  Patient is scheduled to see Dr. Tenny Craw on February 2.  Patient denies drinking or using any illegal substances.  He has no concern with the medication and denies any tremors or shakes.  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  His appetite is okay.  His vitals are stable.  Patient presents for his first visit with me on 03/11/2014. His wife is with him. He states that he is doing very well and feels like he is back to his baseline. He is sleeping well at night and his appetite is good. He doesn't get along with the brother who tried to get power of attorney away  from the father but he is getting along with his sister and other members of his family and his wife is very supportive. 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 He denies any other medical issues at this time. He currently has no suicidal ideation. He agrees to stay on Effexor and trazodone but at some point he would like to wean himself off. I told him not to rush this and to give it at least 6 months    Suicidal Ideation: No Plan Formed: No Patient has means to carry out plan: No  Homicidal Ideation: No Plan Formed: No Patient has means to carry out plan: No  Past Psychiatric History/Hospitalization(s) Patient has psychiatric hospitalization in December 2015 because of severe depression and having suicidal thoughts and plan to kill himself.  He put the gun on his head but did not pull the trigger.  He denies any other previous psychiatric inpatient treatment.  His primary care physician tried him on Wellbutrin, Lexapro and Ambien but he had a poor response.  He has taken Xanax and Valium 20 years ago when he had divorced from his first wife.  Patient denies any history of mania, psychosis, hallucination Anxiety: Yes Bipolar Disorder: No Depression: Yes Mania: No Psychosis: No Schizophrenia: No Personality Disorder: No Hospitalization for psychiatric illness: Yes History of Electroconvulsive Shock Therapy: No  Prior Suicide Attempts: No  Medical History; His primary care physician is Dr. Hilma Favors. He has high cholesterol.  ROS  Psychiatric: Agitation: No Hallucination: No Depressed Mood: No Insomnia: No Hypersomnia: No Altered Concentration: No Feels Worthless: No Grandiose Ideas: No Belief In Special Powers: No New/Increased Substance Abuse: No Compulsions: No  Neurologic: Headache: No Seizure: No Paresthesias: No   Musculoskeletal: Strength & Muscle Tone: within normal limits Gait & Station: normal Patient leans: N/A   Outpatient Encounter  Prescriptions as of 03/11/2014  Medication Sig  . aspirin EC 81 MG tablet Take 1 tablet (81 mg total) by mouth daily. For heart health  . traZODone (DESYREL) 100 MG tablet TAKE ONE TABLET BY MOUTH AT BEDTIME.  Marland Kitchen venlafaxine XR (EFFEXOR-XR) 150 MG 24 hr capsule TAKE ONE CAPSULE BY MOUTH DAILY WITH BREAKFAST FOR DEPRESSION.  . [DISCONTINUED] traZODone (DESYREL) 100 MG tablet TAKE ONE TABLET BY MOUTH AT BEDTIME.  . [DISCONTINUED] venlafaxine XR (EFFEXOR-XR) 150 MG 24 hr capsule TAKE ONE CAPSULE BY MOUTH DAILY WITH BREAKFAST FOR DEPRESSION.    Recent Results (from the past 2160 hour(s))  Urinalysis, Routine w reflex microscopic     Status: None   Collection Time: 01/09/14 12:39 PM  Result Value Ref Range   Color, Urine YELLOW YELLOW   APPearance CLEAR CLEAR   Specific Gravity, Urine 1.025 1.005 - 1.030   pH 6.5 5.0 - 8.0   Glucose, UA NEGATIVE NEGATIVE mg/dL   Hgb urine dipstick NEGATIVE NEGATIVE   Bilirubin Urine NEGATIVE NEGATIVE   Ketones, ur NEGATIVE NEGATIVE mg/dL   Protein, ur NEGATIVE NEGATIVE mg/dL   Urobilinogen, UA 0.2 0.0 - 1.0 mg/dL   Nitrite NEGATIVE NEGATIVE   Leukocytes, UA NEGATIVE NEGATIVE    Comment: MICROSCOPIC NOT DONE ON URINES WITH NEGATIVE PROTEIN, BLOOD, LEUKOCYTES, NITRITE, OR GLUCOSE <1000 mg/dL.  CBC with Differential     Status: Abnormal   Collection Time: 01/09/14 12:40 PM  Result Value Ref Range   WBC 11.9 (H) 4.0 - 10.5 K/uL   RBC 4.48 4.22 - 5.81 MIL/uL   Hemoglobin 14.1 13.0 - 17.0 g/dL   HCT 40.8 39.0 - 52.0 %   MCV 91.1 78.0 - 100.0 fL   MCH 31.5 26.0 - 34.0 pg   MCHC 34.6 30.0 - 36.0 g/dL   RDW 13.2 11.5 - 15.5 %   Platelets 361 150 - 400 K/uL   Neutrophils Relative % 81 (H) 43 - 77 %   Neutro Abs 9.7 (H) 1.7 - 7.7 K/uL   Lymphocytes Relative 12 12 - 46 %   Lymphs Abs 1.4 0.7 - 4.0 K/uL   Monocytes Relative 6 3 - 12 %   Monocytes Absolute 0.7 0.1 - 1.0 K/uL   Eosinophils Relative 1 0 - 5 %   Eosinophils Absolute 0.1 0.0 - 0.7 K/uL    Basophils Relative 0 0 - 1 %   Basophils Absolute 0.1 0.0 - 0.1 K/uL  Comprehensive metabolic panel     Status: Abnormal   Collection Time: 01/09/14 12:40 PM  Result Value Ref Range   Sodium 138 137 - 147 mEq/L   Potassium 4.5 3.7 - 5.3 mEq/L   Chloride 100 96 - 112 mEq/L   CO2 26 19 - 32 mEq/L   Glucose, Bld 113 (H) 70 - 99 mg/dL   BUN 13 6 - 23 mg/dL   Creatinine, Ser 0.68 0.50 - 1.35 mg/dL   Calcium 9.8 8.4 - 10.5 mg/dL   Total Protein 7.4 6.0 - 8.3 g/dL  Albumin 3.9 3.5 - 5.2 g/dL   AST 17 0 - 37 U/L   ALT 13 0 - 53 U/L   Alkaline Phosphatase 67 39 - 117 U/L   Total Bilirubin 0.4 0.3 - 1.2 mg/dL   GFR calc non Af Amer >90 >90 mL/min   GFR calc Af Amer >90 >90 mL/min    Comment: (NOTE) The eGFR has been calculated using the CKD EPI equation. This calculation has not been validated in all clinical situations. eGFR's persistently <90 mL/min signify possible Chronic Kidney Disease.    Anion gap 12 5 - 15  Ethanol     Status: None   Collection Time: 01/09/14 12:40 PM  Result Value Ref Range   Alcohol, Ethyl (B) <11 0 - 11 mg/dL    Comment:        LOWEST DETECTABLE LIMIT FOR SERUM ALCOHOL IS 11 mg/dL FOR MEDICAL PURPOSES ONLY   Drug screen panel, emergency     Status: Abnormal   Collection Time: 01/09/14 12:40 PM  Result Value Ref Range   Opiates NONE DETECTED NONE DETECTED   Cocaine NONE DETECTED NONE DETECTED   Benzodiazepines NONE DETECTED NONE DETECTED   Amphetamines NONE DETECTED NONE DETECTED   Tetrahydrocannabinol POSITIVE (A) NONE DETECTED   Barbiturates NONE DETECTED NONE DETECTED    Comment:        DRUG SCREEN FOR MEDICAL PURPOSES ONLY.  IF CONFIRMATION IS NEEDED FOR ANY PURPOSE, NOTIFY LAB WITHIN 5 DAYS.        LOWEST DETECTABLE LIMITS FOR URINE DRUG SCREEN Drug Class       Cutoff (ng/mL) Amphetamine      1000 Barbiturate      200 Benzodiazepine   419 Tricyclics       622 Opiates          300 Cocaine          300 THC              50    Salicylate level     Status: Abnormal   Collection Time: 01/09/14 12:40 PM  Result Value Ref Range   Salicylate Lvl 1.0 (L) 2.8 - 20.0 mg/dL  TSH     Status: None   Collection Time: 01/10/14  6:19 AM  Result Value Ref Range   TSH 2.090 0.350 - 4.500 uIU/mL    Comment: Performed at [facility]  Lipid panel, fasting     Status: Abnormal   Collection Time: 01/10/14  6:19 AM  Result Value Ref Range   Cholesterol 237 (H) 0 - 200 mg/dL   Triglycerides 102 <150 mg/dL   HDL 57 >39 mg/dL   Total CHOL/HDL Ratio 4.2 RATIO   VLDL 20 0 - 40 mg/dL   LDL Cholesterol 160 (H) 0 - 99 mg/dL    Comment:        Total Cholesterol/HDL:CHD Risk Coronary Heart Disease Risk Table                     Men   Women  1/2 Average Risk   3.4   3.3  Average Risk       5.0   4.4  2 X Average Risk   9.6   7.1  3 X Average Risk  23.4   11.0        Use the calculated Patient Ratio above and the CHD Risk Table to determine the patient's CHD Risk.        ATP III CLASSIFICATION (  LDL):  <100     mg/dL   Optimal  100-129  mg/dL   Near or Above                    Optimal  130-159  mg/dL   Borderline  160-189  mg/dL   High  >190     mg/dL   Very High Performed at [facility]   Hemoglobin A1c     Status: None   Collection Time: 01/10/14  6:19 AM  Result Value Ref Range   Hgb A1c MFr Bld 5.6 <5.7 %    Comment: (NOTE)                                                                       According to the ADA Clinical Practice Recommendations for 2011, when HbA1c is used as a screening test:  >=6.5%   Diagnostic of Diabetes Mellitus           (if abnormal result is confirmed) 5.7-6.4%   Increased risk of developing Diabetes Mellitus References:Diagnosis and Classification of Diabetes Mellitus,Diabetes [facility],9323,55(DDUKG 1):S62-S69 and Standards of Medical Care in         Diabetes - 2011,Diabetes Care,2011,34 (Suppl 1):S11-S61.    Mean Plasma Glucose 114 <117 mg/dL    Comment: Performed at  [facility]      Constitutional:  BP 132/81 mmHg  Pulse 65  Ht 6' (1.829 m)  Wt 159 lb 12.8 oz (72.485 kg)  BMI 21.67 kg/m2   Mental Status Examination;  Patient is casually dressed and fairly groomed.  He is pleasant and cooperative.  He maintained good eye contact.  His his speech is fast, clear and coherent.  His thought processes logical and goal-directed. He is very cheerful and easy to talk with although he is hard of hearing He described his mood good and his affect is improved from the past.  He denies any auditory or visual hallucination.  He denies any active or passive suicidal thoughts or homicidal thought.  His attention and concentration is fair.  There were no delusions, paranoia or any obsessive thoughts.  His fund of knowledge is good.  His psychomotor activity is normal. There were no tremors, shakes or any EPS.  His cognition is good.  There were no flight of ideas or any loose association.  His insight judgment and impulse control is okay.   Established Problem, Stable/Improving (1), Review of Psycho-Social Stressors (1), Review and summation of old records (2), Review of Last Therapy Session (1), Review of Medication Regimen & Side Effects (2) and Review of New Medication or Change in Dosage (2)  Assessment: Axis I: Major depressive disorder, recurrent.  Generalized anxiety disorder  Axis II: Deferred  Axis III:  Past Medical History  Diagnosis Date  . Depression      Plan:   Continue Effexor 150 mg daily and trazodone 100 mg at bedtime.  I recommended to take half trazodone if needed. Time spent 40 minutes.  More than 50% of the time spent in psychoeducation, counseling and coordination of care.  Discuss safety plan that anytime having active suicidal thoughts or homicidal thoughts then patient need to call 911 or go to the local emergency room. He'll  return to see me in 6 weeks but call if symptoms reemerge before that  Levonne Spiller,  MD 03/11/2014

## 2014-04-15 ENCOUNTER — Ambulatory Visit (HOSPITAL_COMMUNITY): Payer: Self-pay | Admitting: Psychology

## 2014-04-23 ENCOUNTER — Ambulatory Visit (INDEPENDENT_AMBULATORY_CARE_PROVIDER_SITE_OTHER): Payer: 59 | Admitting: Psychiatry

## 2014-04-23 ENCOUNTER — Encounter (HOSPITAL_COMMUNITY): Payer: Self-pay | Admitting: Psychiatry

## 2014-04-23 VITALS — BP 111/70 | HR 63 | Ht 72.0 in | Wt 158.0 lb

## 2014-04-23 DIAGNOSIS — F411 Generalized anxiety disorder: Secondary | ICD-10-CM

## 2014-04-23 DIAGNOSIS — F339 Major depressive disorder, recurrent, unspecified: Secondary | ICD-10-CM

## 2014-04-23 DIAGNOSIS — F331 Major depressive disorder, recurrent, moderate: Secondary | ICD-10-CM

## 2014-04-23 MED ORDER — TRAZODONE HCL 100 MG PO TABS: ORAL_TABLET | ORAL | Status: DC

## 2014-04-23 MED ORDER — VENLAFAXINE HCL ER 150 MG PO CP24: ORAL_CAPSULE | ORAL | Status: DC

## 2014-04-23 NOTE — Progress Notes (Signed)
Patient ID: Mike Moran, male   DOB: 05-Nov-1955, 59 y.o.   MRN: [id number] Patient ID: Mike Moran, male   DOB: August 20, 1955, 59 y.o.   MRN: [id number]  [facility] [id number] Progress Note   Mike Moran [id number] 59 y.o.  04/23/2014 1:52 PM  Chief Complaint:  I am doing better. I'm back to myself  History of Present Illness:  Mike Moran came for his follow-up appointment.  He was seen first time on December 17.  He was referred from inpatient psychiatric treatment.  He was discharged on multiple medication .  We recommended him to increase Effexor and stop using Ativan every day and Vistaril only if needed.  His trazodone was increased because he was complaining of poor sleep.  He is feeling much better.  Today he came with his wife.  He endorsed improvement in his sleep and depression.  He denies any crying spells or any panic attack.  He denies any active or passive suicidal thoughts or homicidal thought.  He mentioned his family issues also getting better.  He has a good support from his family member and he believed that things are going very well.  Patient denies any side effects of medication.  He denies any feeling of hopelessness or worthlessness.  He was scheduled to see Dr. Shelva Majestic however he has decided not to see counselor because his symptoms are getting better.  Patient is scheduled to see Dr. Tenny Craw on February 2.  Patient denies drinking or using any illegal substances.  He has no concern with the medication and denies any tremors or shakes.  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  His appetite is okay.  His vitals are stable.  Patient returns after one month with his wife. He has cut his his trazodone in half and is still sleeping well. He continues on Effexor and his mood is good. This is his busy season for his business and he is often  working 50+ hours a week. Nevertheless he is staying upbeat and he is no longer angry with his family members. He has no thoughts of suicide and seems to be in a great mood today   Suicidal Ideation: No Plan Formed: No Patient has means to carry out plan: No  Homicidal Ideation: No Plan Formed: No Patient has means to carry out plan: No  Past Psychiatric History/Hospitalization(s) Patient has psychiatric hospitalization in December 2015 because of severe depression and having suicidal thoughts and plan to kill himself.  He put the gun on his head but did not pull the trigger.  He denies any other previous psychiatric inpatient treatment.  His primary care physician tried him on Wellbutrin, Lexapro and Ambien but he had a poor response.  He has taken Xanax and Valium 20 years ago when he had divorced from his first wife.  Patient denies any history of mania, psychosis, hallucination Anxiety: Yes Bipolar Disorder: No Depression: Yes Mania: No Psychosis: No Schizophrenia: No Personality Disorder: No Hospitalization for psychiatric illness: Yes History of Electroconvulsive Shock Therapy: No Prior Suicide Attempts: No  Medical History; His primary care physician is Dr. Phillips Odor. He has high cholesterol.  ROS  Psychiatric: Agitation: No Hallucination: No Depressed Mood: No Insomnia: No Hypersomnia: No Altered Concentration: No Feels Worthless: No Grandiose Ideas: No Belief In Special Powers: No New/Increased Substance  Abuse: No Compulsions: No  Neurologic: Headache: No Seizure: No Paresthesias: No   Musculoskeletal: Strength & Muscle Tone: within normal limits Gait & Station: normal Patient leans: N/A   Outpatient Encounter Prescriptions as of 04/23/2014  Medication Sig  . aspirin EC 81 MG tablet Take 1 tablet (81 mg total) by mouth daily. For heart health  . traZODone (DESYREL) 100 MG tablet TAKE ONE TABLET BY MOUTH AT BEDTIME.  Marland Kitchen. venlafaxine XR (EFFEXOR-XR) 150 MG 24 hr  capsule TAKE ONE CAPSULE BY MOUTH DAILY WITH BREAKFAST FOR DEPRESSION.  . [DISCONTINUED] traZODone (DESYREL) 100 MG tablet TAKE ONE TABLET BY MOUTH AT BEDTIME.  . [DISCONTINUED] venlafaxine XR (EFFEXOR-XR) 150 MG 24 hr capsule TAKE ONE CAPSULE BY MOUTH DAILY WITH BREAKFAST FOR DEPRESSION.    No results found for this or any previous visit (from the past 2160 hour(s)).    Constitutional:  BP 111/70 mmHg  Pulse 63  Ht 6' (1.829 m)  Wt 158 lb (71.668 kg)  BMI 21.42 kg/m2   Mental Status Examination;  Patient is casually dressed and fairly groomed.  He is pleasant and cooperative.  He maintained good eye contact.  His his speech is fast, clear and coherent.  His thought processes logical and goal-directed. He is very cheerful and easy to talk with although he is hard of hearing He described his mood good and his affect is bright.  He denies any auditory or visual hallucination.  He denies any active or passive suicidal thoughts or homicidal thought.  His attention and concentration is fair.  There were no delusions, paranoia or any obsessive thoughts.  His fund of knowledge is good.  His psychomotor activity is normal. There were no tremors, shakes or any EPS.  His cognition is good.  There were no flight of ideas or any loose association.  His insight judgment and impulse control is okay.   Established Problem, Stable/Improving (1), Review of Psycho-Social Stressors (1), Review and summation of old records (2), Review of Last Therapy Session (1), Review of Medication Regimen & Side Effects (2) and Review of New Medication or Change in Dosage (2)  Assessment: Axis I: Major depressive disorder, recurrent.  Generalized anxiety disorder  Axis II: Deferred  Axis III:  Past Medical History  Diagnosis Date  . Depression      Plan:   Continue Effexor 150 mg daily and trazodone 50-100 mg at bedtime.. Time spent 15 minutes.  More than 50% of the time spent in psychoeducation, counseling and  coordination of care.  Discuss safety plan that anytime having active suicidal thoughts or homicidal thoughts then patient need to call 911 or go to the local emergency room. He'll return to see me in 3 months but call if symptoms reemerge before that  Diannia RuderOSS, DEBORAH, MD 04/23/2014

## 2014-07-24 ENCOUNTER — Encounter (HOSPITAL_COMMUNITY): Payer: Self-pay | Admitting: Psychiatry

## 2014-07-24 ENCOUNTER — Ambulatory Visit (INDEPENDENT_AMBULATORY_CARE_PROVIDER_SITE_OTHER): Payer: 59 | Admitting: Psychiatry

## 2014-07-24 VITALS — BP 141/93 | HR 65 | Ht 72.0 in | Wt 153.8 lb

## 2014-07-24 DIAGNOSIS — F331 Major depressive disorder, recurrent, moderate: Secondary | ICD-10-CM

## 2014-07-24 DIAGNOSIS — F411 Generalized anxiety disorder: Secondary | ICD-10-CM

## 2014-07-24 DIAGNOSIS — F339 Major depressive disorder, recurrent, unspecified: Secondary | ICD-10-CM | POA: Diagnosis not present

## 2014-07-24 MED ORDER — VENLAFAXINE HCL ER 75 MG PO CP24: 75.0000 mg | ORAL_CAPSULE | Freq: Every day | ORAL | Status: DC

## 2014-07-24 NOTE — Progress Notes (Signed)
Patient ID: Mike Moran, male   DOB: 1955/02/15, 59 y.o.   MRN: [id number] Patient ID: Mike Moran, male   DOB: January 28, 1956, 59 y.o.   MRN: [id number] Patient ID: Mike Moran, male   DOB: 10/28/1955, 59 y.o.   MRN: [id number]  [facility] [id number] Progress Note   Mike Moran [id number] 59 y.o.  07/24/2014 1:20 PM  Chief Complaint:  I am doing better. I'm back to myself  History of Present Illness:  [name] came for his follow-up appointment.  He was seen first time on December 17.  He was referred from inpatient psychiatric treatment.  He was discharged on multiple medication .  We recommended him to increase Effexor and stop using Ativan every day and Vistaril only if needed.  His trazodone was increased because he was complaining of poor sleep.  He is feeling much better.  Today he came with his wife.  He endorsed improvement in his sleep and depression.  He denies any crying spells or any panic attack.  He denies any active or passive suicidal thoughts or homicidal thought.  He mentioned his family issues also getting better.  He has a good support from his family member and he believed that things are going very well.  Patient denies any side effects of medication.  He denies any feeling of hopelessness or worthlessness.  He was scheduled to see Dr. Shelva Majestic however he has decided not to see counselor because his symptoms are getting better.  Patient is scheduled to see Dr. Tenny Craw on February 2.  Patient denies drinking or using any illegal substances.  He has no concern with the medication and denies any tremors or shakes.  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  His appetite is okay.  His vitals are stable.  Patient returns after 3 months. He is doing well. He is working very hard and this is his busy time of year. He has totally gotten off  trazodone and sleeping well without it. He is wanting to get off Effexor but I told him we would need to do this very slowly because of withdrawal effects. For today we will decrease from 150-75 mg daily and see how that goes for the next 3 months. He denies any depressive symptoms today but tells me he will call me immediately if they start to come back   Suicidal Ideation: No Plan Formed: No Patient has means to carry out plan: No  Homicidal Ideation: No Plan Formed: No Patient has means to carry out plan: No  Past Psychiatric History/Hospitalization(s) Patient has psychiatric hospitalization in December 2015 because of severe depression and having suicidal thoughts and plan to kill himself.  He put the gun on his head but did not pull the trigger.  He denies any other previous psychiatric inpatient treatment.  His primary care physician tried him on Wellbutrin, Lexapro and Ambien but he had a poor response.  He has taken Xanax and Valium 20 years ago when he had divorced from his first wife.  Patient denies any history of mania, psychosis, hallucination Anxiety: Yes Bipolar Disorder: No Depression: Yes Mania: No Psychosis: No Schizophrenia: No Personality Disorder: No Hospitalization for psychiatric illness: Yes History of Electroconvulsive Shock Therapy: No Prior Suicide Attempts: No  Medical History; His primary care physician is Dr. Phillips Odor. He  has high cholesterol.  ROS  Psychiatric: Agitation: No Hallucination: No Depressed Mood: No Insomnia: No Hypersomnia: No Altered Concentration: No Feels Worthless: No Grandiose Ideas: No Belief In Special Powers: No New/Increased Substance Abuse: No Compulsions: No  Neurologic: Headache: No Seizure: No Paresthesias: No   Musculoskeletal: Strength & Muscle Tone: within normal limits Gait & Station: normal Patient leans: N/A   Outpatient Encounter Prescriptions as of 07/24/2014  Medication Sig  . aspirin EC 81 MG tablet  Take 1 tablet (81 mg total) by mouth daily. For heart health  . [DISCONTINUED] venlafaxine XR (EFFEXOR-XR) 150 MG 24 hr capsule TAKE ONE CAPSULE BY MOUTH DAILY WITH BREAKFAST FOR DEPRESSION.  Marland Kitchen venlafaxine XR (EFFEXOR XR) 75 MG 24 hr capsule Take 1 capsule (75 mg total) by mouth daily.  . [DISCONTINUED] traZODone (DESYREL) 100 MG tablet TAKE ONE TABLET BY MOUTH AT BEDTIME. (Patient not taking: Reported on 07/24/2014)   No facility-administered encounter medications on file as of 07/24/2014.    No results found for this or any previous visit (from the past 2160 hour(s)).    Constitutional:  BP 141/93 mmHg  Pulse 65  Ht 6' (1.829 m)  Wt 153 lb 12.8 oz (69.763 kg)  BMI 20.85 kg/m2   Mental Status Examination;  Patient is casually dressed and fairly groomed.  He is pleasant and cooperative.  He maintained good eye contact.  His his speech is fast, clear and coherent.  His thought processes logical and goal-directed. He is very cheerful and easy to talk with although he is hard of hearing He described his mood good and his affect is bright.  He denies any auditory or visual hallucination.  He denies any active or passive suicidal thoughts or homicidal thought.  His attention and concentration is fair.  There were no delusions, paranoia or any obsessive thoughts.  His fund of knowledge is good.  His psychomotor activity is normal. There were no tremors, shakes or any EPS.  His cognition is good.  There were no flight of ideas or any loose association.  His insight judgment and impulse control is okay. His memory fund of knowledge and speech are all good   Established Problem, Stable/Improving (1), Review of Psycho-Social Stressors (1), Review and summation of old records (2), Review of Last Therapy Session (1), Review of Medication Regimen & Side Effects (2) and Review of New Medication or Change in Dosage (2)  Assessment: Axis I: Major depressive disorder, recurrent.  Generalized anxiety  disorder  Axis II: Deferred  Axis III:  Past Medical History  Diagnosis Date  . Depression      Plan:   Continue Effexor XR but decrease the dosage to 75 mg daily for depression. If his symptoms start to come back he will call me immediately. Time spent 15 minutes.  More than 50% of the time spent in psychoeducation, counseling and coordination of care.  Discuss safety plan that anytime having active suicidal thoughts or homicidal thoughts then patient need to call 911 or go to the local emergency room. He'll return to see me in 3 months but call if symptoms reemerge before that  Diannia Ruder, MD 07/24/2014

## 2014-09-26 ENCOUNTER — Other Ambulatory Visit (HOSPITAL_COMMUNITY): Payer: Self-pay | Admitting: Psychiatry

## 2014-10-24 ENCOUNTER — Ambulatory Visit (INDEPENDENT_AMBULATORY_CARE_PROVIDER_SITE_OTHER): Payer: 59 | Admitting: Psychiatry

## 2014-10-24 ENCOUNTER — Ambulatory Visit (HOSPITAL_COMMUNITY): Payer: Self-pay | Admitting: Psychiatry

## 2014-10-24 ENCOUNTER — Encounter (HOSPITAL_COMMUNITY): Payer: Self-pay | Admitting: Psychiatry

## 2014-10-24 VITALS — BP 110/60 | Ht 72.0 in | Wt 158.0 lb

## 2014-10-24 DIAGNOSIS — F411 Generalized anxiety disorder: Secondary | ICD-10-CM | POA: Diagnosis not present

## 2014-10-24 DIAGNOSIS — F331 Major depressive disorder, recurrent, moderate: Secondary | ICD-10-CM

## 2014-10-24 MED ORDER — VENLAFAXINE HCL ER 37.5 MG PO CP24: ORAL_CAPSULE | ORAL | Status: DC

## 2014-10-24 NOTE — Progress Notes (Signed)
Patient ID: Mike Moran, male   DOB: 10-25-1955, 59 y.o.   MRN: [id number] Patient ID: Mike Moran, male   DOB: Nov 21, 1955, 59 y.o.   MRN: [id number] Patient ID: Mike Moran, male   DOB: 10/20/1955, 59 y.o.   MRN: [id number] Patient ID: Mike Moran, male   DOB: 08-16-1955, 59 y.o.   MRN: [id number]  [facility] [id number] Progress Note   Mike Moran [id number] 59 y.o.  10/24/2014 3:41 PM  Chief Complaint:  I am doing better. I'm back to myself  History of Present Illness:  Wake came for his follow-up appointment.  He was seen first time on December 17.  He was referred from inpatient psychiatric treatment.  He was discharged on multiple medication .  We recommended him to increase Effexor and stop using Ativan every day and Vistaril only if needed.  His trazodone was increased because he was complaining of poor sleep.  He is feeling much better.  Today he came with his wife.  He endorsed improvement in his sleep and depression.  He denies any crying spells or any panic attack.  He denies any active or passive suicidal thoughts or homicidal thought.  He mentioned his family issues also getting better.  He has a good support from his family member and he believed that things are going very well.  Patient denies any side effects of medication.  He denies any feeling of hopelessness or worthlessness.  He was scheduled to see Dr. Shelva Majestic however he has decided not to see counselor because his symptoms are getting better.  Patient is scheduled to see Dr. Tenny Craw on February 2.  Patient denies drinking or using any illegal substances.  He has no concern with the medication and denies any tremors or shakes.  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  His appetite is okay.  His vitals are stable.  Patient returns after 3 months. He is doing well. He is working  very hard and this is his busy time of year. He has totally gotten off trazodone and sleeping well without it. He is down to 75 mg of Effexor XR. I told him the next step would be to go down to 37.5 mg for a couple of weeks and take it every other day for couple of weeks and then stop and he is in agreement. His mood is been very stable he is sleeping and eating well and has good energy  Suicidal Ideation: No Plan Formed: No Patient has means to carry out plan: No  Homicidal Ideation: No Plan Formed: No Patient has means to carry out plan: No  Past Psychiatric History/Hospitalization(s) Patient has psychiatric hospitalization in December 2015 because of severe depression and having suicidal thoughts and plan to kill himself.  He put the gun on his head but did not pull the trigger.  He denies any other previous psychiatric inpatient treatment.  His primary care physician tried him on Wellbutrin, Lexapro and Ambien but he had a poor response.  He has taken Xanax and Valium 20 years ago when he had divorced from his first wife.  Patient denies any history of mania, psychosis, hallucination Anxiety: Yes Bipolar Disorder: No Depression: Yes Mania: No Psychosis: No Schizophrenia: No Personality Disorder: No Hospitalization for psychiatric illness: Yes History of Electroconvulsive Shock Therapy: No  Prior Suicide Attempts: No  Medical History; His primary care physician is Dr. Phillips Odor. He has high cholesterol.  ROS  Psychiatric: Agitation: No Hallucination: No Depressed Mood: No Insomnia: No Hypersomnia: No Altered Concentration: No Feels Worthless: No Grandiose Ideas: No Belief In Special Powers: No New/Increased Substance Abuse: No Compulsions: No  Neurologic: Headache: No Seizure: No Paresthesias: No   Musculoskeletal: Strength & Muscle Tone: within normal limits Gait & Station: normal Patient leans: N/A   Outpatient Encounter Prescriptions as of 10/24/2014  Medication  Sig  . aspirin EC 81 MG tablet Take 1 tablet (81 mg total) by mouth daily. For heart health  . venlafaxine XR (EFFEXOR XR) 37.5 MG 24 hr capsule Take one daily for 2 weeks, then one every other day for 2 weeks and then stop  . [DISCONTINUED] venlafaxine XR (EFFEXOR XR) 75 MG 24 hr capsule Take 1 capsule (75 mg total) by mouth daily.   No facility-administered encounter medications on file as of 10/24/2014.    No results found for this or any previous visit (from the past 2160 hour(s)).    Constitutional:  BP 110/60 mmHg  Ht 6' (1.829 m)  Wt 158 lb (71.668 kg)  BMI 21.42 kg/m2   Mental Status Examination;  Patient is casually dressed and fairly groomed.  He is pleasant and cooperative.  He maintained good eye contact.  His his speech is fast, clear and coherent.  His thought processes logical and goal-directed. He is very cheerful and easy to talk with although he is hard of hearing He described his mood good and his affect is bright.  He denies any auditory or visual hallucination.  He denies any active or passive suicidal thoughts or homicidal thought.  His attention and concentration is fair.  There were no delusions, paranoia or any obsessive thoughts.  His fund of knowledge is good.  His psychomotor activity is normal. There were no tremors, shakes or any EPS.  His cognition is good.  There were no flight of ideas or any loose association.  His insight judgment and impulse control is okay. His memory fund of knowledge and speech are all good   Established Problem, Stable/Improving (1), Review of Psycho-Social Stressors (1), Review and summation of old records (2), Review of Last Therapy Session (1), Review of Medication Regimen & Side Effects (2) and Review of New Medication or Change in Dosage (2)  Assessment: Axis I: Major depressive disorder, recurrent.  Generalized anxiety disorder  Axis II: Deferred  Axis III:  Past Medical History  Diagnosis Date  . Depression      Plan:    Continue Effexor XR but decrease the dosage to 37.5 mg daily for depression for 2 weeks and then take it every other day for 2 weeks and then discontinue. If his symptoms start to come back he will call me immediately. Time spent 15 minutes.  More than 50% of the time spent in psychoeducation, counseling and coordination of care.  Discuss safety plan that anytime having active suicidal thoughts or homicidal thoughts then patient need to call 911 or go to the local emergency room. He'll return to see me in 3 months but call if symptoms reemerge before that  Diannia Ruder, MD 10/24/2014

## 2015-01-23 ENCOUNTER — Ambulatory Visit (INDEPENDENT_AMBULATORY_CARE_PROVIDER_SITE_OTHER): Payer: 59 | Admitting: Psychiatry

## 2015-01-23 ENCOUNTER — Encounter (HOSPITAL_COMMUNITY): Payer: Self-pay | Admitting: Psychiatry

## 2015-01-23 VITALS — BP 114/75 | HR 72 | Ht 72.0 in | Wt 168.8 lb

## 2015-01-23 DIAGNOSIS — F331 Major depressive disorder, recurrent, moderate: Secondary | ICD-10-CM

## 2015-01-23 DIAGNOSIS — F411 Generalized anxiety disorder: Secondary | ICD-10-CM

## 2015-01-23 NOTE — Progress Notes (Signed)
Patient ID: KIMM UNGARO, male   DOB: 10/31/1955, 59 y.o.   MRN: [id number] Patient ID: UDELL MAZZOCCO, male   DOB: 15-Jun-1955, 59 y.o.   MRN: [id number] Patient ID: RESEAN BRANDER, male   DOB: August 05, 1955, 59 y.o.   MRN: [id number] Patient ID: BLONG BUSK, male   DOB: July 11, 1955, 59 y.o.   MRN: [id number] Patient ID: SUKHRAJ ESQUIVIAS, male   DOB: [birth year], 59 y.o.   MRN: [id number]  [facility] [id number] Progress Note   Mike Moran [id number] 59 y.o.  01/23/2015 3:15 PM  Chief Complaint:  I am doing better. I'm back to myself  History of Present Illness:  Mike Moran came for his follow-up appointment.  He was seen first time on December 17.  He was referred from inpatient psychiatric treatment.  He was discharged on multiple medication .  We recommended him to increase Effexor and stop using Ativan every day and Vistaril only if needed.  His trazodone was increased because he was complaining of poor sleep.  He is feeling much better.  Today he came with his wife.  He endorsed improvement in his sleep and depression.  He denies any crying spells or any panic attack.  He denies any active or passive suicidal thoughts or homicidal thought.  He mentioned his family issues also getting better.  He has a good support from his family member and he believed that things are going very well.  Patient denies any side effects of medication.  He denies any feeling of hopelessness or worthlessness.  He was scheduled to see Dr. Shelva Majestic however he has decided not to see counselor because his symptoms are getting better.  Patient is scheduled to see Dr. Tenny Craw on February 2.  Patient denies drinking or using any illegal substances.  He has no concern with the medication and denies any tremors or shakes.  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  His appetite is okay.  His vitals  are stable.  Patient returns after 3 months. He is doing well. He is working very hard. Last time we tapered off the Effexor and is totally off of it and is on no psychotropic medication. He is eating and sleeping well, his energy is good and he is not having crying or panic attacks. He doesn't feel like he needs medicine anymore and realizes he went through a rough patch there for a while  Suicidal Ideation: No Plan Formed: No Patient has means to carry out plan: No  Homicidal Ideation: No Plan Formed: No Patient has means to carry out plan: No  Past Psychiatric History/Hospitalization(s) Patient has psychiatric hospitalization in December 2015 because of severe depression and having suicidal thoughts and plan to kill himself.  He put the gun on his head but did not pull the trigger.  He denies any other previous psychiatric inpatient treatment.  His primary care physician tried him on Wellbutrin, Lexapro and Ambien but he had a poor response.  He has taken Xanax and Valium 20 years ago when he had divorced from his first wife.  Patient denies any history of mania, psychosis, hallucination Anxiety: Yes Bipolar Disorder: No Depression: Yes Mania: No Psychosis: No Schizophrenia: No Personality Disorder: No Hospitalization for psychiatric illness: Yes History of Electroconvulsive Shock Therapy: No Prior Suicide Attempts: No  Medical History;  His primary care physician is Dr. Phillips OdorGolding. He has high cholesterol.  ROS  Psychiatric: Agitation: No Hallucination: No Depressed Mood: No Insomnia: No Hypersomnia: No Altered Concentration: No Feels Worthless: No Grandiose Ideas: No Belief In Special Powers: No New/Increased Substance Abuse: No Compulsions: No  Neurologic: Headache: No Seizure: No Paresthesias: No   Musculoskeletal: Strength & Muscle Tone: within normal limits Gait & Station: normal Patient leans: N/A   Outpatient Encounter Prescriptions as of 01/23/2015   Medication Sig  . aspirin EC 81 MG tablet Take 1 tablet (81 mg total) by mouth daily. For heart health  . [DISCONTINUED] venlafaxine XR (EFFEXOR XR) 37.5 MG 24 hr capsule Take one daily for 2 weeks, then one every other day for 2 weeks and then stop (Patient not taking: Reported on 01/23/2015)   No facility-administered encounter medications on file as of 01/23/2015.    No results found for this or any previous visit (from the past 2160 hour(s)).    Constitutional:  BP 114/75 mmHg  Pulse 72  Ht 6' (1.829 m)  Wt 168 lb 12.8 oz (76.567 kg)  BMI 22.89 kg/m2  SpO2 98%   Mental Status Examination;  Patient is casually dressed and fairly groomed.  He is pleasant and cooperative.  He maintained good eye contact.  His his speech is fast, clear and coherent.  His thought processes logical and goal-directed. He is very cheerful and easy to talk with although he is hard of hearing He described his mood good and his affect is bright.  He denies any auditory or visual hallucination.  He denies any active or passive suicidal thoughts or homicidal thought.  His attention and concentration is fair.  There were no delusions, paranoia or any obsessive thoughts.  His fund of knowledge is good.  His psychomotor activity is normal. There were no tremors, shakes or any EPS.  His cognition is good.  There were no flight of ideas or any loose association.  His insight judgment and impulse control is okay. His memory fund of knowledge and speech are all good   Established Problem, Stable/Improving (1), Review of Psycho-Social Stressors (1), Review and summation of old records (2), Review of Last Therapy Session (1), Review of Medication Regimen & Side Effects (2) and Review of New Medication or Change in Dosage (2)  Assessment: Axis I: Major depressive disorder, recurrent.  Generalized anxiety disorder  Axis II: Deferred  Axis III:  Past Medical History  Diagnosis Date  . Depression      Plan:  The  patient is off medication now and is very stable. He could return here only on a when necessary basis. If his depression recurs or he feels anxious or can't sleep he has been instructed to call me right away  Mike Moran, Ami Mally, MD 01/23/2015

## 2015-07-16 ENCOUNTER — Encounter (HOSPITAL_COMMUNITY): Payer: Self-pay | Admitting: Psychiatry

## 2015-07-16 ENCOUNTER — Ambulatory Visit (INDEPENDENT_AMBULATORY_CARE_PROVIDER_SITE_OTHER): Payer: 59 | Admitting: Psychiatry

## 2015-07-16 VITALS — BP 113/66 | HR 64 | Ht 72.0 in | Wt 150.8 lb

## 2015-07-16 DIAGNOSIS — F411 Generalized anxiety disorder: Secondary | ICD-10-CM

## 2015-07-16 DIAGNOSIS — F331 Major depressive disorder, recurrent, moderate: Secondary | ICD-10-CM | POA: Diagnosis not present

## 2015-07-16 MED ORDER — VENLAFAXINE HCL ER 75 MG PO CP24: ORAL_CAPSULE | ORAL | Status: DC

## 2015-07-16 NOTE — Progress Notes (Signed)
Patient ID: Shara BlazingJohnny C Zhao, male   DOB: 26-Apr-1955, 60 y.o.   MRN: [id number] Patient ID: Shara BlazingJohnny C Torr, male   DOB: 26-Apr-1955, 60 y.o.   MRN: [id number] Patient ID: Shara BlazingJohnny C Schepp, male   DOB: 26-Apr-1955, 60 y.o.   MRN: [id number] Patient ID: Shara BlazingJohnny C Muller, male   DOB: 26-Apr-1955, 60 y.o.   MRN: [id number] Patient ID: Shara BlazingJohnny C Pettey, male   DOB: 26-Apr-1955, 60 y.o.   MRN: [id number] Patient ID: Shara BlazingJohnny C Freedman, male   DOB: 26-Apr-1955, 60 y.o.   MRN: [id number]  [facility] [id number] Progress Note   Shara BlazingJohnny C Boeder [id number] 60 y.o.  07/16/2015 8:38 AM  Chief Complaint:  I am doing better. I'm back to myself  History of Present Illness:  [name] came for his follow-up appointment.  He was seen first time on December 17.  He was referred from inpatient psychiatric treatment.  He was discharged on multiple medication .  We recommended him to increase Effexor and stop using Ativan every day and Vistaril only if needed.  His trazodone was increased because he was complaining of poor sleep.  He is feeling much better.  Today he came with his wife.  He endorsed improvement in his sleep and depression.  He denies any crying spells or any panic attack.  He denies any active or passive suicidal thoughts or homicidal thought.  He mentioned his family issues also getting better.  He has a good support from his family member and he believed that things are going very well.  Patient denies any side effects of medication.  He denies any feeling of hopelessness or worthlessness.  He was scheduled to see Dr. Shelva Majesticodenbaugh however he has decided not to see counselor because his symptoms are getting better.  Patient is scheduled to see Dr. Tenny Crawoss on February 2.  Patient denies drinking or using any illegal substances.  He has no concern with the medication and denies any tremors or shakes.  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  He likes his job and he is staying with  the same company for more than 25 years.  His appetite is okay.  His vitals are stable.  Patient returns after 6 months with his daughter. Last time he decided to totally go off antidepressants and did well. However over the last few months he has been in decline. He has not been eating well and has lost 18 pounds. He is more depressed and sad and worries constantly. He denies suicidal ideation. At times he feels hopeless and thinks of the worst possible outcomes for situations. We discussed the fact that he did well on Effexor and it would be prudent to go back on it and stay on it Suicidal Ideation: No Plan Formed: No Patient has means to carry out plan: No  Homicidal Ideation: No Plan Formed: No Patient has means to carry out plan: No  Past Psychiatric History/Hospitalization(s) Patient has psychiatric hospitalization in December 2015 because of severe depression and having suicidal thoughts and plan to kill himself.  He put the gun on his head but did not pull the trigger.  He denies any other previous psychiatric inpatient treatment.  His primary care physician tried him on Wellbutrin, Lexapro and Ambien but he had a poor response.  He has taken Xanax and Valium 20 years ago when he had divorced from his first wife.  Patient denies any history of mania,  psychosis, hallucination Anxiety: Yes Bipolar Disorder: No Depression: Yes Mania: No Psychosis: No Schizophrenia: No Personality Disorder: No Hospitalization for psychiatric illness: Yes History of Electroconvulsive Shock Therapy: No Prior Suicide Attempts: No  Medical History; His primary care physician is Dr. Phillips Odor. He has high cholesterol.  ROS  Psychiatric: Agitation: No Hallucination: No Depressed Mood: No Insomnia: No Hypersomnia: No Altered Concentration: No Feels Worthless: No Grandiose Ideas: No Belief In Special Powers: No New/Increased Substance Abuse: No Compulsions: No  Neurologic: Headache: No Seizure:  No Paresthesias: No   Musculoskeletal: Strength & Muscle Tone: within normal limits Gait & Station: normal Patient leans: N/A   Outpatient Encounter Prescriptions as of 07/16/2015  Medication Sig  . aspirin EC 81 MG tablet Take 1 tablet (81 mg total) by mouth daily. For heart health  . Multiple Vitamin (MULTIVITAMIN) capsule Take 1 capsule by mouth daily.  Marland Kitchen venlafaxine XR (EFFEXOR XR) 75 MG 24 hr capsule Take one capsule in the am for 2 weeks, then 2 capsules in the am   No facility-administered encounter medications on file as of 07/16/2015.    No results found for this or any previous visit (from the past 2160 hour(s)).    Constitutional:  BP 113/66 mmHg  Pulse 64  Ht 6' (1.829 m)  Wt 150 lb 12.8 oz (68.402 kg)  BMI 20.45 kg/m2  SpO2 97%   Mental Status Examination;  Patient is casually dressed and fairly groomed.  He is pleasant and cooperative.  He maintained good eye contact.  His his speech is fast, clear and coherent.  His thought processes logical and goal-directed. He isd easy to talk with although he is hard of hearing He described his mood as depressed and his affect is dysphoric. He looks very thin.  He denies any auditory or visual hallucination.  He denies any active or passive suicidal thoughts or homicidal thought.  His attention and concentration is fair.  There were no delusions, paranoia or any obsessive thoughts.  His fund of knowledge is good.  His psychomotor activity is normal. There were no tremors, shakes or any EPS.  His cognition is good.  There were no flight of ideas or any loose association.  His insight judgment and impulse control is okay. His memory fund of knowledge and speech are all good   Established Problem, Stable/Improving (1), Review of Psycho-Social Stressors (1), Review and summation of old records (2), Review of Last Therapy Session (1), Review of Medication Regimen & Side Effects (2) and Review of New Medication or Change in Dosage  (2)  Assessment: Axis I: Major depressive disorder, recurrent.  Generalized anxiety disorder  Axis II: Deferred  Axis III:  Past Medical History  Diagnosis Date  . Depression      Plan:  The patient Will restart Effexor XR at 75 mg every morning for 2 weeks then advance to 150 mg every morning. He'll return in 4 weeks  Diannia Ruder, MD 07/16/2015

## 2015-07-29 ENCOUNTER — Other Ambulatory Visit: Payer: Self-pay

## 2015-07-29 ENCOUNTER — Encounter (HOSPITAL_COMMUNITY): Payer: Self-pay | Admitting: Emergency Medicine

## 2015-07-29 ENCOUNTER — Emergency Department (HOSPITAL_COMMUNITY)
Admission: EM | Admit: 2015-07-29 | Discharge: 2015-07-29 | Disposition: A | Discharge status: Other Acute Inpatient Hospital | Payer: 59 | Attending: Emergency Medicine | Admitting: Emergency Medicine

## 2015-07-29 ENCOUNTER — Telehealth (HOSPITAL_COMMUNITY): Payer: Self-pay | Admitting: *Deleted

## 2015-07-29 DIAGNOSIS — Z79899 Other long term (current) drug therapy: Secondary | ICD-10-CM | POA: Diagnosis not present

## 2015-07-29 DIAGNOSIS — F329 Major depressive disorder, single episode, unspecified: Secondary | ICD-10-CM | POA: Diagnosis not present

## 2015-07-29 DIAGNOSIS — Z7982 Long term (current) use of aspirin: Secondary | ICD-10-CM | POA: Insufficient documentation

## 2015-07-29 DIAGNOSIS — Z87891 Personal history of nicotine dependence: Secondary | ICD-10-CM | POA: Diagnosis not present

## 2015-07-29 DIAGNOSIS — R45851 Suicidal ideations: Secondary | ICD-10-CM | POA: Diagnosis present

## 2015-07-29 LAB — RAPID URINE DRUG SCREEN, HOSP PERFORMED
AMPHETAMINES: NOT DETECTED
BARBITURATES: NOT DETECTED
BENZODIAZEPINES: NOT DETECTED
COCAINE: NOT DETECTED
Opiates: NOT DETECTED
TETRAHYDROCANNABINOL: POSITIVE — AB

## 2015-07-29 LAB — CBC
HEMATOCRIT: 42.4 % (ref 39.0–52.0)
Hemoglobin: 14.4 g/dL (ref 13.0–17.0)
MCH: 31.2 pg (ref 26.0–34.0)
MCHC: 34 g/dL (ref 30.0–36.0)
MCV: 92 fL (ref 78.0–100.0)
PLATELETS: 389 10*3/uL (ref 150–400)
RBC: 4.61 MIL/uL (ref 4.22–5.81)
RDW: 13.3 % (ref 11.5–15.5)
WBC: 10.9 10*3/uL — ABNORMAL HIGH (ref 4.0–10.5)

## 2015-07-29 LAB — ETHANOL: Alcohol, Ethyl (B): <5 mg/dL (ref <5)

## 2015-07-29 LAB — COMPREHENSIVE METABOLIC PANEL
ALBUMIN: 4.3 g/dL (ref 3.5–5.0)
ALT: 19 U/L (ref 17–63)
AST: 18 U/L (ref 15–41)
Alkaline Phosphatase: 39 U/L (ref 38–126)
Anion gap: 7 (ref 5–15)
BILIRUBIN TOTAL: 0.8 mg/dL (ref 0.3–1.2)
BUN: 14 mg/dL (ref 6–20)
CHLORIDE: 105 mmol/L (ref 101–111)
CO2: 26 mmol/L (ref 22–32)
CREATININE: 0.77 mg/dL (ref 0.61–1.24)
Calcium: 9.3 mg/dL (ref 8.9–10.3)
GFR calc Af Amer: >60 mL/min (ref >60)
GLUCOSE: 127 mg/dL — AB (ref 65–99)
POTASSIUM: 3.9 mmol/L (ref 3.5–5.1)
Sodium: 138 mmol/L (ref 135–145)
Total Protein: 7.1 g/dL (ref 6.5–8.1)

## 2015-07-29 MED ORDER — ACETAMINOPHEN 325 MG PO TABS: 650.0000 mg | ORAL_TABLET | ORAL | Status: DC | PRN

## 2015-07-29 MED ORDER — NICOTINE 21 MG/24HR TD PT24: 21.0000 mg | MEDICATED_PATCH | Freq: Every day | TRANSDERMAL | Status: DC | PRN

## 2015-07-29 MED ORDER — LORAZEPAM 1 MG PO TABS: 1.0000 mg | ORAL_TABLET | Freq: Three times a day (TID) | ORAL | Status: DC | PRN

## 2015-07-29 MED ORDER — ASPIRIN EC 81 MG PO TBEC
81.0000 mg | DELAYED_RELEASE_TABLET | Freq: Every day | ORAL | Status: DC
  Administered 2015-07-29: 81 mg via ORAL
  Filled 2015-07-29: qty 1

## 2015-07-29 MED ORDER — VENLAFAXINE HCL ER 37.5 MG PO CP24
150.0000 mg | ORAL_CAPSULE | Freq: Every day | ORAL | Status: DC
  Administered 2015-07-29: 75 mg via ORAL

## 2015-07-29 MED ORDER — ZOLPIDEM TARTRATE 5 MG PO TABS
5.0000 mg | ORAL_TABLET | Freq: Every evening | ORAL | Status: DC | PRN
Start: 2015-07-29 — End: 2015-07-29

## 2015-07-29 MED ORDER — ONDANSETRON HCL 4 MG PO TABS: 4.0000 mg | ORAL_TABLET | Freq: Three times a day (TID) | ORAL | Status: DC | PRN

## 2015-07-29 MED ORDER — IBUPROFEN 400 MG PO TABS: 600.0000 mg | ORAL_TABLET | Freq: Three times a day (TID) | ORAL | Status: DC | PRN

## 2015-07-29 NOTE — ED Provider Notes (Signed)
Patient accepted behavioral [facility] hospital by Dr. Jama Flavorsobos.  BP 126/80 mmHg  Pulse 59  Temp(Src) 97.7 F (36.5 C) (Oral)  Resp 18  Ht 6' (1.829 m)  Wt 145 lb (65.772 kg)  BMI 19.66 kg/m2  SpO2 100%   Glynn OctaveStephen Zaydenn Balaguer, MD 07/29/15 1945

## 2015-07-29 NOTE — ED Provider Notes (Signed)
CSN: [id number]     Arrival date & time 07/29/15  [id number] History  By signing my name below, I, Rosario AdieWilliam Andrew Hiatt, attest that this documentation has been prepared under the direction and in the presence of Azalia BilisKevin Flecia Shutter, MD.  Electronically Signed: Rosario AdieWilliam Andrew Hiatt, ED Scribe. 07/29/2015. 8:53 AM.   Chief Complaint  Patient presents with  . V70.1   LEVEL 5 CAVEAT: HPI and ROS limited due to psychiatric illness.   HPI HPI Comments: Mike Moran is a 60 y.o. male BIB EMS who presents to the Emergency Department for a psychiatric evaluation. Per EMS pt was found by his wife this morning with a loaded gun to his head. En route the pt stated that "I should have pulled the trigger" multiple times. Per nurse at bedside, he has not been compliant with his prescribed Zyprexa, and recently switched over to it from another medication. His daughter states that since he has switched to Zyprexa that his mental health has been declining. Pt is not currently followed by any psychiatrist and states that "he does not want one". He denies smoking or drinking alcohol regularly.   Past Medical History  Diagnosis Date  . Depression    History reviewed. No pertinent past surgical history. No family history on file. Social History  Substance Use Topics  . Smoking status: Former Games developermoker  . Smokeless tobacco: None  . Alcohol Use: 0.0 oz/week    0 Standard drinks or equivalent per week     Comment: occ- pt denies SA use    Review of Systems  Unable to perform ROS: Psychiatric disorder   Allergies  Codeine and Lexapro  Home Medications   Prior to Admission medications   Medication Sig Start Date End Date Taking? Authorizing Provider  aspirin EC 81 MG tablet Take 1 tablet (81 mg total) by mouth daily. For heart health 01/14/14   Sanjuana KavaAgnes I Nwoko, NP  Multiple Vitamin (MULTIVITAMIN) capsule Take 1 capsule by mouth daily.    Historical Provider, MD  venlafaxine XR (EFFEXOR XR) 75 MG 24 hr capsule Take one  capsule in the am for 2 weeks, then 2 capsules in the am 07/16/15   Myrlene Brokereborah R Ross, MD   BP 130/79 mmHg  Pulse 60  Temp(Src) 97.7 F (36.5 C) (Oral)  Resp 18  Ht 6' (1.829 m)  Wt 145 lb (65.772 kg)  BMI 19.66 kg/m2  SpO2 98%   Physical Exam  Constitutional: He is oriented to person, place, and time. He appears well-developed and well-nourished.  HENT:  Head: Normocephalic.  Eyes: EOM are normal.  Neck: Normal range of motion.  Pulmonary/Chest: Effort normal.  Abdominal: He exhibits no distension.  Musculoskeletal: Normal range of motion.  Neurological: He is alert and oriented to person, place, and time.  Psychiatric: His affect is labile. He is slowed and withdrawn. He expresses suicidal ideation. He expresses suicidal plans. He is noncommunicative.  Nursing note and vitals reviewed.  ED Course  Procedures (including critical care time)  DIAGNOSTIC STUDIES: Oxygen Saturation is 98% on RA, normal by my interpretation.   COORDINATION OF CARE: 8:52 AM-Discussed next steps with pt including EKG, CMP, and CBC. Pt verbalized understanding and is agreeable with the plan.   Labs Review Labs Reviewed  COMPREHENSIVE METABOLIC PANEL  ETHANOL  URINE RAPID DRUG SCREEN, HOSP PERFORMED  CBC   I have personally reviewed and evaluated these lab results as part of my medical decision-making.   EKG Interpretation None  MDM   Final diagnoses:  None    Patient will require inpatient treatment at a behavior health mental health facility.  Involuntary commitment forms filled out.  Sitter at bedside.  Vital signs stable.  Patient is very despondent.  Will restart home medications and continue to follow closely while in the emergency department.  We'll follow up on all lab work  I personally performed the services described in this documentation, which was scribed in my presence. The recorded information has been reviewed and is accurate.       Azalia Bilis, MD 07/29/15  [phone]
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
## 2015-07-29 NOTE — ED Notes (Signed)
Family back at bedside.

## 2015-07-29 NOTE — Telephone Encounter (Signed)
Mike Moran came by office today, said patient had events this morning and is at [facility].

## 2015-07-29 NOTE — ED Notes (Signed)
Pt assessed by Telephysch.

## 2015-07-29 NOTE — ED Notes (Signed)
[id number] to [facility] @1900 . [facility]. Attending: [place]

## 2015-07-30 ENCOUNTER — Inpatient Hospital Stay (HOSPITAL_COMMUNITY)
Admission: AD | Admit: 2015-07-30 | Discharge: 2015-08-04 | DRG: 885 | Disposition: A | Discharge status: Home / Self Care | Payer: 59 | Source: Intra-hospital | Attending: Psychiatry | Admitting: Psychiatry

## 2015-07-30 ENCOUNTER — Encounter (HOSPITAL_COMMUNITY): Payer: Self-pay

## 2015-07-30 DIAGNOSIS — R45851 Suicidal ideations: Secondary | ICD-10-CM | POA: Diagnosis present

## 2015-07-30 DIAGNOSIS — Z7982 Long term (current) use of aspirin: Secondary | ICD-10-CM | POA: Diagnosis not present

## 2015-07-30 DIAGNOSIS — F411 Generalized anxiety disorder: Secondary | ICD-10-CM | POA: Diagnosis present

## 2015-07-30 DIAGNOSIS — F332 Major depressive disorder, recurrent severe without psychotic features: Secondary | ICD-10-CM | POA: Diagnosis present

## 2015-07-30 DIAGNOSIS — G4700 Insomnia, unspecified: Secondary | ICD-10-CM | POA: Diagnosis present

## 2015-07-30 MED ORDER — ZOLPIDEM TARTRATE 5 MG PO TABS
5.0000 mg | ORAL_TABLET | Freq: Every evening | ORAL | Status: DC | PRN
Start: 2015-07-30 — End: 2015-08-04
  Filled 2015-07-30: qty 1

## 2015-07-30 MED ORDER — ASPIRIN EC 81 MG PO TBEC
81.0000 mg | DELAYED_RELEASE_TABLET | Freq: Every day | ORAL | Status: DC
  Administered 2015-07-30 – 2015-08-04 (×6): 81 mg via ORAL
  Filled 2015-07-30 (×9): qty 1

## 2015-07-30 MED ORDER — ENSURE ENLIVE PO LIQD
237.0000 mL | Freq: Two times a day (BID) | ORAL | Status: DC
Start: 2015-07-30 — End: 2015-08-04
  Administered 2015-07-30 – 2015-08-02 (×7): 237 mL via ORAL

## 2015-07-30 MED ORDER — ACETAMINOPHEN 325 MG PO TABS: 650.0000 mg | ORAL_TABLET | Freq: Four times a day (QID) | ORAL | Status: DC | PRN

## 2015-07-30 MED ORDER — LORAZEPAM 1 MG PO TABS
1.0000 mg | ORAL_TABLET | Freq: Three times a day (TID) | ORAL | Status: DC | PRN
  Administered 2015-07-30 – 2015-07-31 (×2): 1 mg via ORAL
  Filled 2015-07-30 (×4): qty 1

## 2015-07-30 MED ORDER — VENLAFAXINE HCL ER 150 MG PO CP24
150.0000 mg | ORAL_CAPSULE | Freq: Every day | ORAL | Status: DC
  Administered 2015-07-30: 150 mg via ORAL
  Filled 2015-07-30 (×2): qty 1

## 2015-07-30 MED ORDER — NICOTINE 21 MG/24HR TD PT24: 21.0000 mg | MEDICATED_PATCH | Freq: Every day | TRANSDERMAL | Status: DC | PRN

## 2015-07-30 MED ORDER — IBUPROFEN 600 MG PO TABS: 600.0000 mg | ORAL_TABLET | Freq: Three times a day (TID) | ORAL | Status: DC | PRN

## 2015-07-30 MED ORDER — TRAZODONE HCL 50 MG PO TABS
50.0000 mg | ORAL_TABLET | Freq: Every evening | ORAL | Status: DC | PRN
  Administered 2015-07-31 – 2015-08-02 (×4): 50 mg via ORAL
  Filled 2015-07-30 (×3): qty 1
  Filled 2015-07-30: qty 2

## 2015-07-30 MED ORDER — VENLAFAXINE HCL ER 75 MG PO CP24
75.0000 mg | ORAL_CAPSULE | Freq: Every day | ORAL | Status: DC
  Administered 2015-07-31: 75 mg via ORAL
  Filled 2015-07-30 (×2): qty 1

## 2015-07-30 MED ORDER — MAGNESIUM HYDROXIDE 400 MG/5ML PO SUSP
30.0000 mL | Freq: Every day | ORAL | Status: DC | PRN
  Administered 2015-08-01: 30 mL via ORAL
  Filled 2015-07-30: qty 30

## 2015-07-30 MED ORDER — ONDANSETRON HCL 4 MG PO TABS
4.0000 mg | ORAL_TABLET | Freq: Three times a day (TID) | ORAL | Status: DC | PRN
  Administered 2015-07-30: 4 mg via ORAL
  Filled 2015-07-30: qty 1

## 2015-07-30 NOTE — Progress Notes (Signed)
Recreation Therapy Notes  Date: 06.21.2017 Time: 9:30am Location: [address] Dayroom   Group Topic: Stress Management  Goal Area(s) Addresses:  Patient will actively participate in stress management techniques presented during session.   Behavioral Response: Did not attend.   Marya Lowden L Ladene Allocca, LRT/CTRS         Alina Gilkey L 07/30/2015 1:24 PM 

## 2015-07-30 NOTE — [facility] Group Notes (Signed)
[facility] Note  07/30/2015 8:45 AM  Pt did not attend, declined invitation.   Chad CordialLauren Carter, LCSWA 07/30/2015 10:41 AM

## 2015-07-30 NOTE — [facility] Group Notes (Signed)
[facility] LCSW Group Therapy 07/30/2015 1:15 PM  Type of Therapy: Group Therapy- Emotion Regulation  Pt did not attend, declined invitation.   Chad CordialLauren Carter, LCSWA 07/30/2015 4:08 PM

## 2015-07-30 NOTE — Tx Team (Signed)
Initial Interdisciplinary Treatment Plan   PATIENT STRESSORS: Medication change or noncompliance   PATIENT STRENGTHS: General fund of knowledge Motivation for treatment/growth Supportive family/friends   PROBLEM LIST: Problem List/Patient Goals Date to be addressed Date deferred Reason deferred Estimated date of resolution  I want to get my medications adjusted" "I want help with my depression"                                                       DISCHARGE CRITERIA:  Improved stabilization in mood, thinking, and/or behavior Reduction of life-threatening or endangering symptoms to within safe limits Verbal commitment to aftercare and medication compliance  PRELIMINARY DISCHARGE PLAN: Attend aftercare/continuing care group Return to previous living arrangement  PATIENT/FAMIILY INVOLVEMENT: This treatment plan has been presented to and reviewed with the patient, Mike Moran, and/or family member, .  The patient and family have been given the opportunity to ask questions and make suggestions.  Andrena Mewsuttall, Trinaty Bundrick J 07/30/2015, 1:50 AM

## 2015-07-30 NOTE — [facility] Suicide Risk Assessment (Signed)
[facility]   Nursing information obtained from:    Demographic factors:   patient is a 60 year old married Caucasian male. Current Mental Status:   patient is lying in bed with minimal speech. States is very depressed and thinks about death all the time. He is able to contract for safety. Denies any auditory or visual hallucinations. He has some insight and judgment. Loss Factors:   loss of full-time job, father's death Historical Factors:   history of depression Risk Reduction Factors:   married  Total Time spent with patient: 30 minutes Principal Problem: <principal problem not specified> Diagnosis:   Patient Active Problem List   Diagnosis Date Noted  . MDD (major depressive disorder), recurrent episode, severe (HCC) [F33.2] 07/30/2015  . GAD (generalized anxiety disorder) [F41.1]   . MDD (major depressive disorder), severe (HCC) [F32.2] 01/09/2014   Subjective Data: Patient is a 60 year old Caucasian male who was committed by his wife since he took a gun and was about to commit suicide due to severe depression.  Continued Clinical Symptoms:  Alcohol Use Disorder Identification Test Final Score (AUDIT): 0 The "Alcohol Use Disorders Identification Test", Guidelines for Use in Primary Care, Second Edition.  World Science writerHealth Organization [facility](WHO). Score between 0-7:  no or low risk or alcohol related problems. Score between 8-15:  moderate risk of alcohol related problems. Score between 16-19:  high risk of alcohol related problems. Score 20 or above:  warrants further diagnostic evaluation for alcohol dependence and treatment.   CLINICAL FACTORS:   Depression:   Anhedonia Hopelessness Impulsivity Insomnia Severe   Musculoskeletal: Strength & Muscle Tone: within normal limits Gait & Station: normal Patient leans: N/A  Psychiatric Specialty Exam: Physical Exam  ROS  Blood pressure 101/65, pulse 101, temperature 98.6 F (37 C), temperature source Oral,  resp. rate 16, height 6' (1.829 m), weight 145 lb (65.772 kg).Body mass index is 19.66 kg/(m^2).   General Appearance: Disheveled  Eye Contact: Minimal  Speech: Blocked and Slow  Volume: Decreased  Mood: Anxious, Depressed, Dysphoric and Hopeless  Affect: Blunt, Constricted and Depressed  Thought Process: Coherent  Orientation: Full (Time, Place, and Person)  Thought Content: Rumination  Suicidal Thoughts: Yes. with intent/plan  Homicidal Thoughts: No  Memory: Immediate; Fair Recent; Fair Remote; Fair  Judgement: Impaired  Insight: Shallow  Psychomotor Activity: Decreased and Psychomotor Retardation  Concentration: Concentration: Poor and Attention Span: Poor  Recall: Poor  Fund of Knowledge: Fair  Language: Unable to assess since patient is not talking   Akathisia: No  Handed: Right  AIMS (if indicated):    Assets: Social Support  ADL's: Intact  Cognition: WNL  Sleep: Number of Hours: 4.5          COGNITIVE FEATURES THAT CONTRIBUTE TO RISK:  Thought constriction (tunnel vision)    SUICIDE RISK:   Severe:  Frequent, intense, and enduring suicidal ideation, specific plan, no subjective intent, but some objective markers of intent (i.e., choice of lethal method), the method is accessible, some limited preparatory behavior, evidence of impaired self-control, severe dysphoria/symptomatology, multiple risk factors present, and few if any protective factors, particularly a lack of social support.  PLAN OF CARE:   Treatment Plan Summary: Daily contact with patient to assess and evaluate symptoms and progress in treatment and Medication management  Observation Level/Precautions:  15 minute checks  Laboratory:  Based on admission labs CBC and the basic metabolic panel are normal. Hemoglobin A1c is normal. No alcohol detected in the system.   Psychotherapy:  Once patient is able to encourage him to attend groups to  develop coping skills to deal with his early retirement and other transition of life issues   Medications:  Will taper patient off Effexor since per his wife patient has not responded well and his depression appeared to have worsened on the Effexor. Discontinue patient off the Effexor.   Consultations:  As needed   Discharge Concerns:  Safety and stabilization   Estimated LOS:4-5 days     I certify that inpatient services furnished can reasonably be expected to improve the patient's condition.   Patrick North, MD 07/30/2015, 10:48 AM
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
## 2015-07-30 NOTE — Progress Notes (Signed)
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
## 2015-07-30 NOTE — Progress Notes (Signed)
Interdisciplinary Treatment Plan Update (Adult) Date: 07/30/2015   Date: 07/30/2015 8:21 AM  Progress in Treatment:  Attending groups: Pt is new to milieu, continuing to assess  Participating in groups: Pt is new to milieu, continuing to assess  Taking medication as prescribed: Yes  Tolerating medication: Yes  Family/Significant othe contact made: No, CSW attempting to make contact with wife Patient understands diagnosis: Yes AEB seeking help with depression Discussing patient identified problems/goals with staff: Yes  Medical problems stabilized or resolved: Yes  Denies suicidal/homicidal ideation: No, recently admitted with SI Patient has not harmed self or Others: Yes   New problem(s) identified: None identified at this time.   Discharge Plan or Barriers: Pt will return home and follow-up with outpatient provider  Additional comments:  Patient and CSW reviewed pt's identified goals and treatment plan. Patient verbalized understanding and agreed to treatment plan.   Reason for Continuation of Hospitalization:  Anxiety Depression Medication stabilization Suicidal ideation  Estimated length of stay: 3-5 days  Review of initial/current patient goals per problem list:   1.  Goal(s): Patient will participate in aftercare plan  Met:  Yes  Target date: 3-5 days from date of admission   As evidenced by: Patient will participate within aftercare plan AEB aftercare provider and housing plan at discharge being identified.   07/30/15: Pt will return home and follow-up with outpatient provider  2.  Goal (s): Patient will exhibit decreased depressive symptoms and suicidal ideations.  Met:  No  Target date: 3-5 days from date of admission   As evidenced by: Patient will utilize self rating of depression at 3 or below and demonstrate decreased signs of depression or be deemed stable for discharge by MD. 07/30/15: Pt was admitted with symptoms of depression, rating 10/10. Pt continues  to present with flat affect and depressive symptoms.  Pt will demonstrate decreased symptoms of depression and rate depression at 3/10 or lower prior to discharge.  3.  Goal(s): Patient will demonstrate decreased signs and symptoms of anxiety.  Met:  No  Target date: 3-5 days from date of admission   As evidenced by: Patient will utilize self rating of anxiety at 3 or below and demonstrated decreased signs of anxiety, or be deemed stable for discharge by MD 07/30/15: Pt was admitted with increased levels of anxiety and is currently rating those symptoms highly. Pt will demonstrated decreased symptoms of anxiety and rate it at 3/10 prior to d/c.  Attendees:  Patient:    Family:    Physician: Dr. Einar Grad, MD  07/30/2015 8:21 AM  Nursing: Eulogio Bear, RN; Gaylan Gerold, RN 07/30/2015 8:21 AM  Clinical Social Worker Peri Maris, [place] 07/30/2015 8:21 AM  Other: Erasmo Downer Drinkard, LCSWA 07/30/2015 8:21 AM  Clinical: Lars Pinks, RN Case manager  07/30/2015 8:21 AM  Other:  07/30/2015 8:21 AM  Other:     Peri Maris, [place] Social Work [phone]

## 2015-07-30 NOTE — Progress Notes (Signed)
DAR NOTE: Pt present with depressed mood in the unit, pt has been in the bed most of the time, pt stated he is not feeling and very tired. Pt did came out for his meds,denied any physical pain. Pt did not feel out the self inventory forms, but did state his depression is a 7, hopelessness at a 7, and anxiety at 7. Pt provided with medications per providers orders. Pt's labs and vitals were monitored throughout the day. Pt supported emotionally and encouraged to express concerns and questions. Pt educated on medications. Pt's safety ensured with 15 minute and environmental checks. Pt currently denies SI/HI and A/V hallucinations. Pt verbally agrees to seek staff if SI/HI or A/VH occurs and to consult with staff before acting on any harmful thoughts. Will continue POC.

## 2015-07-30 NOTE — Progress Notes (Signed)
D   Pt said he rested some last night and requested information about his medications and wanted trazodone for sleep   He did not understand  Why the doctor is weaning him off the effexor   He told his family he was not seen by a doctor today A    Verbal support given   Explained that doctor attempted to see pt but pt was too sleepy to talk to the doctor    Medications administered and effectiveness monitored   Q 15 min checks R   Pt safe at present and verbalized understanding

## 2015-07-30 NOTE — Progress Notes (Signed)
NUTRITION ASSESSMENT  Pt identified as at risk on the Malnutrition Screen Tool  INTERVENTION: 1. Supplements: Continue Ensure Enlive po BID, each supplement provides 350 kcal and 20 grams of protein  NUTRITION DIAGNOSIS: Unintentional weight loss related to sub-optimal intake as evidenced by pt report.   Goal: Pt to meet >/= 90% of their estimated nutrition needs.  Monitor:  PO intake  Assessment:  Pt admitted with depression. Pt report poor appetite. Per weight history pt has lost 17 lb since December 2016 (14% wt loss x 6 months, significant for time frame). Pt has been ordered Ensure supplements, will continue order.   Height: Ht Readings from Last 1 Encounters:  07/30/15 6' (1.829 m)    Weight: Wt Readings from Last 1 Encounters:  07/30/15 145 lb (65.772 kg)    Weight Hx: Wt Readings from Last 10 Encounters:  07/30/15 145 lb (65.772 kg)  07/29/15 145 lb (65.772 kg)  07/16/15 150 lb 12.8 oz (68.402 kg)  01/23/15 168 lb 12.8 oz (76.567 kg)  10/24/14 158 lb (71.668 kg)  07/24/14 153 lb 12.8 oz (69.763 kg)  04/23/14 158 lb (71.668 kg)  03/11/14 159 lb 12.8 oz (72.485 kg)  03/05/14 160 lb (72.576 kg)  01/24/14 155 lb 6.4 oz (70.489 kg)    BMI:  Body mass index is 19.66 kg/(m^2). Pt meets criteria for normal range based on current BMI.  Estimated Nutritional Needs: Kcal: 25-30 kcal/kg Protein: > 1 gram protein/kg Fluid: 1 ml/kcal  Diet Order: Diet regular Room service appropriate?: Yes; Fluid consistency:: Thin Pt is also offered choice of unit snacks mid-morning and mid-afternoon.  Pt is eating as desired.   Lab results and medications reviewed.   Tilda FrancoLindsey Rando, MS, RD, LDN Pager: [phone] After Hours Pager: [phone]

## 2015-07-31 DIAGNOSIS — F332 Major depressive disorder, recurrent severe without psychotic features: Secondary | ICD-10-CM

## 2015-07-31 MED ORDER — RISPERIDONE 0.25 MG PO TABS
0.2500 mg | ORAL_TABLET | Freq: Two times a day (BID) | ORAL | Status: DC
  Administered 2015-07-31 – 2015-08-04 (×9): 0.25 mg via ORAL
  Filled 2015-07-31 (×17): qty 1

## 2015-07-31 NOTE — Progress Notes (Signed)
Patient did not attend karaoke group tonight. 

## 2015-07-31 NOTE — [facility] Group Notes (Signed)
[facility] Group Notes:  (Nursing/MHT/Case Management/Adjunct)  Date:  07/31/2015  Time:  1:29 PM  Type of Therapy:  Nurse Education  Participation Level:  Did Not Attend    Mickie Baillizabeth O Iwenekha 07/31/2015, 1:29 PM

## 2015-07-31 NOTE — Progress Notes (Signed)
[facility]  07/31/2015 10:27 AM Shara BlazingJohnny C Fotheringham  MRN:  [id number] Subjective:  Patient was seen in his room and then notes reviewed. Patient lying in bed and continues to speak in a very low tone and mumbling. Patient able to follow directions and was able to sit up in his bed. Reports he has no appetite and feeling shaky. He also reports hearing voices that tell him to finish it And it's all over. States that since taking the Effexor he has felt shaky and more depressed. He is able to contract for safety on the unit. Poor appetite.  Principal Problem: MDD with psychotic symptoms Diagnosis:   Patient Active Problem List   Diagnosis Date Noted  . MDD (major depressive disorder), recurrent episode, severe (HCC) [F33.2] 07/30/2015  . GAD (generalized anxiety disorder) [F41.1]   . MDD (major depressive disorder), severe (HCC) [F32.2] 01/09/2014   Total Time spent with patient: 20 minutes  Past Psychiatric History: Long history of depression  Past Medical History:  Past Medical History  Diagnosis Date  . Depression    History reviewed. No pertinent past surgical history. Family History: History reviewed. No pertinent family history. Family Psychiatric  History: Unknown Social History:  History  Alcohol Use  . 0.0 oz/week  . 0 Standard drinks or equivalent per week    Comment: occ- pt denies SA use     History  Drug Use No    Social History   Social History  . Marital Status: Married    Spouse Name: N/A  . Number of Children: N/A  . Years of Education: N/A   Social History Main Topics  . Smoking status: Former Games developermoker  . Smokeless tobacco: None  . Alcohol Use: 0.0 oz/week    0 Standard drinks or equivalent per week     Comment: occ- pt denies SA use  . Drug Use: No  . Sexual Activity: Not Asked   Other Topics Concern  . None   Social History Narrative   Additional Social History:    Pain Medications: see PTA meds Prescriptions: see PTA meds Over the  Counter: see PTA meds History of alcohol / drug use?: No history of alcohol / drug abuse                    Sleep: Fair  Appetite:  Poor  Current Medications: Current Facility-Administered Medications  Medication Dose Route Frequency Provider Last Rate Last Dose  . acetaminophen (TYLENOL) tablet 650 mg  650 mg Oral Q6H PRN Thermon LeylandLaura A Davis, NP      . aspirin EC tablet 81 mg  81 mg Oral Daily Thermon LeylandLaura A Davis, NP   81 mg at 07/31/15 0831  . feeding supplement (ENSURE ENLIVE) (ENSURE ENLIVE) liquid 237 mL  237 mL Oral BID BM Rockey SituFernando A Cobos, MD   237 mL at 07/30/15 1432  . ibuprofen (ADVIL,MOTRIN) tablet 600 mg  600 mg Oral Q8H PRN Thermon LeylandLaura A Davis, NP      . LORazepam (ATIVAN) tablet 1 mg  1 mg Oral Q8H PRN Thermon LeylandLaura A Davis, NP   1 mg at 07/31/15 0204  . magnesium hydroxide (MILK OF MAGNESIA) suspension 30 mL  30 mL Oral Daily PRN Thermon LeylandLaura A Davis, NP      . nicotine (NICODERM CQ - dosed in mg/24 hours) patch 21 mg  21 mg Transdermal Daily PRN Thermon LeylandLaura A Davis, NP      . ondansetron [facility](ZOFRAN) tablet 4 mg  4 mg  Oral Q8H PRN Thermon Leyland, NP   4 mg at 07/30/15 1258  . risperiDONE (RISPERDAL) tablet 0.25 mg  0.25 mg Oral BID Claudio Mondry, MD      . traZODone (DESYREL) tablet 50 mg  50 mg Oral QHS PRN,MR X 1 Kerry Hough, PA-C   50 mg at 07/31/15 0204  . zolpidem (AMBIEN) tablet 5 mg  5 mg Oral QHS PRN Thermon Leyland, NP        Lab Results:  Results for orders placed or performed during the hospital encounter of 07/29/15 (from the past 48 hour(s))  Urine rapid drug screen (hosp performed)not at [facility]     Status: Abnormal   Collection Time: 07/29/15  3:00 PM  Result Value Ref Range   Opiates NONE DETECTED NONE DETECTED   Cocaine NONE DETECTED NONE DETECTED   Benzodiazepines NONE DETECTED NONE DETECTED   Amphetamines NONE DETECTED NONE DETECTED   Tetrahydrocannabinol POSITIVE (A) NONE DETECTED   Barbiturates NONE DETECTED NONE DETECTED    Comment:        DRUG SCREEN FOR MEDICAL PURPOSES ONLY.   IF CONFIRMATION IS NEEDED FOR ANY PURPOSE, NOTIFY LAB WITHIN 5 DAYS.        LOWEST DETECTABLE LIMITS FOR URINE DRUG SCREEN Drug Class       Cutoff (ng/mL) Amphetamine      1000 Barbiturate      200 Benzodiazepine   200 Tricyclics       300 Opiates          300 Cocaine          300 THC              50     Blood Alcohol level:  Lab Results  Component Value Date   [facility] <5 07/29/2015   ETH <11 01/09/2014    Physical Findings: AIMS: Facial and Oral Movements Muscles of Facial Expression: None, normal Lips and Perioral Area: None, normal Jaw: None, normal Tongue: None, normal,Extremity Movements Upper (arms, wrists, hands, fingers): None, normal Lower (legs, knees, ankles, toes): None, normal, Trunk Movements Neck, shoulders, hips: None, normal, Overall Severity Severity of abnormal movements (highest score from questions above): None, normal Incapacitation due to abnormal movements: None, normal Patient's awareness of abnormal movements (rate only patient's report): No Awareness, Dental Status Current problems with teeth and/or dentures?: No Does patient usually wear dentures?: No  CIWA:    COWS:     Musculoskeletal: Strength & Muscle Tone: flaccid Gait & Station: normal Patient leans: N/A  Psychiatric Specialty Exam: Physical Exam  ROS  Blood pressure 91/66, pulse 103, temperature 98.3 F (36.8 C), temperature source Oral, resp. rate 16, height 6' (1.829 m), weight 145 lb (65.772 kg).Body mass index is 19.66 kg/(m^2).  General Appearance: Disheveled  Eye Contact:  Minimal  Speech:  Garbled  Volume:  Decreased  Mood:  Depressed, Dysphoric and Hopeless  Affect:  Depressed and Flat  Thought Process:  Irrelevant  Orientation:  Full (Time, Place, and Person)  Thought Content:  Hallucinations: Auditory and Rumination  Suicidal Thoughts:  Yes.  without intent/plan  Homicidal Thoughts:  No  Memory:  Immediate;   Poor Recent;   Poor Remote;   Poor  Judgement:   Impaired  Insight:  Lacking  Psychomotor Activity:  Decreased and Psychomotor Retardation  Concentration:  Concentration: Poor and Attention Span: Poor  Recall:  Poor  Fund of Knowledge:  Poor  Language:  Poor  Akathisia:  No  Handed:  Right  AIMS (if indicated):     Assets:  Housing Physical Health Social Support  ADL's:  Impaired  Cognition:  WNL  Sleep:  Number of Hours: 6.75     Treatment Plan Summary: Daily contact with patient to assess and evaluate symptoms and progress in treatment and Medication management   Major depressive disorder with psychotic features Discontinue Effexor Unable to start SSRI since patient is allergic to these class of medications Start Risperdal at 0.25 mg twice daily Nursing to encourage patient to get out of bed and spent time in the day area and attending groups. Nursing to encourage patient to eat his meals and also to take daily shower. Continue to monitor closely  Patrick NorthAVI, Jacere Pangborn, MD 07/31/2015, 10:27 AM

## 2015-07-31 NOTE — [facility] Group Notes (Signed)
[facility] Group Therapy 07/31/2015 1:15pm  Type of Therapy: [facility] Presentation  Pt did not attend, declined invitation.    Katriel Cutsforth Carter, LCSWA 07/31/2015 1:49 PM  

## 2015-07-31 NOTE — Progress Notes (Signed)
DAR NOTE: Patient affect is flat with depressed mood. Patient reports poor night sleep. Denies pain, auditory and visual hallucinations.  Patient appetite poor and states "I don't have reason to live."  Patient contracts for safety.  Maintained on routine safety checks.  Medications given as prescribed.  Support and encouragement offered as needed.  Patient remained in his room most of this shift sleeping.  Food and fluid intake encouraged.

## 2015-07-31 NOTE — Progress Notes (Signed)
Patient ID: Mike Moran, male   DOB: 1955-03-27, 60 y.o.   MRN: [id number]  Adult Psychoeducational Group Note  Date:  07/31/2015 Time:  09:45am   Group Topic/Focus:  Self Care:   The focus of this group is to help patients understand the importance of self-care in order to improve or restore emotional, physical, spiritual, interpersonal, and financial health.  Participation Level:  Did Not Attend  Participation Quality:    Affect:   Cognitive:    Insight:  Engagement in Group:   Modes of Intervention:    Additional Comments:  Pt did not attend, pt in bed asleep.   [name], Mike Moran E 07/31/2015, 10:56 AM

## 2015-07-31 NOTE — Progress Notes (Signed)
Patient did not attend wrap-up group, was sleeping. 

## 2015-07-31 NOTE — [facility] Suicide Risk Assessment (Signed)
[facility] INPATIENT:  Family/Significant Other Suicide Prevention Education  Suicide Prevention Education:  Education Completed; Mike Moran, Pt's wife [phone], has been identified by the patient as the family member/significant other with whom the patient will be residing, and identified as the person(s) who will aid the patient in the event of a mental health crisis (suicidal ideations/suicide attempt).  With written consent from the patient, the family member/significant other has been provided the following suicide prevention education, prior to the and/or following the discharge of the patient.  The suicide prevention education provided includes the following:  Suicide risk factors  Suicide prevention and interventions  National Suicide Hotline telephone number  [facility] assessment telephone number  [facility] Emergency Assistance 911  [facility] and/or [facility]idential Mobile Crisis Unit telephone number  Request made of family/significant other to:  Remove weapons (e.g., guns, rifles, knives), all items previously/currently identified as safety concern.    Remove drugs/medications (over-the-counter, prescriptions, illicit drugs), all items previously/currently identified as a safety concern.  The family member/significant other verbalizes understanding of the suicide prevention education information provided.  The family member/significant other agrees to remove the items of safety concern listed above.  Elaina HoopsCarter, Mike Moran 07/31/2015, 8:50 AM

## 2015-08-01 MED ORDER — MAGNESIUM CITRATE PO SOLN
1.0000 | Freq: Once | ORAL | Status: AC
  Administered 2015-08-01: 1 via ORAL

## 2015-08-01 NOTE — [facility] Group Notes (Signed)
[facility] LCSW Group Therapy 08/01/2015 1:15pm  Type of Therapy: Group Therapy- Feelings Around Relapse and Recovery  Participation Level: Minimal  Participation Quality:  N/A  Affect:  Flat  Cognitive: Alert and Oriented   Insight:  Unable to Assess  Engagement in Therapy: Minimal  Modes of Intervention: Clarification, Confrontation, Discussion, Education, Exploration, Limit-setting, Orientation, Problem-solving, Rapport Building, Dance movement psychotherapisteality Testing, Socialization and Support  Summary of Progress/Problems: The topic for today was feelings about relapse. The group discussed what relapse prevention is to them and identified triggers that they are on the path to relapse. Members also processed their feeling towards relapse and were able to relate to common experiences. Group also discussed coping skills that can be used for relapse prevention.  Pt was present during group but did not participate in group discussion.   Therapeutic Modalities:   Cognitive Behavioral Therapy Solution-Focused Therapy Assertiveness Training Relapse Prevention Therapy    Lamar SprinklesLauren Carter, LCSWA [phone] 08/01/2015 3:27 PM

## 2015-08-01 NOTE — [facility] Group Notes (Signed)
[facility]  08/01/2015 8:45 AM  Pt did not attend, declined invitation.   Chad CordialLauren Carter, LCSWA 08/01/2015 1:03 PM

## 2015-08-01 NOTE — Progress Notes (Signed)
DAR NOTE: Patient presents with depressed mood and affect.  Denies pain, auditory and visual hallucinations.  Reports suicidal thoughts on self inventory form but contracts for safety.  Rates depression at 9, hopelessness at 9, and anxiety at 6.  Maintained on routine safety checks.  Described energy level as low and concentration as poor.  Medications given as prescribed.  Support and encouragement offered as needed.  States goal for today is "to get better."  Patient encouraged to be visible in milieu.  Patient agreed, got out of bed shaved and took shower.  Patient mood and affect was bright afterward.  Patient complain of constipation and report no bowel movement x 5 days.  MOM and a bottle of Citrate Magnesium given with good result.

## 2015-08-01 NOTE — Progress Notes (Signed)
D: Pt was pleasant and cooperative. Was laying in bed prior to the assessment. When asked about his day pt stated he didn't know. Then stated, "it wasn't that good".  When asked if there was anything that I could do to make his day better, pt stated, "can you give me a new brain". Stated, "this depression thing..." Pt states he's hoping to get on the right medication.  Pt has no other questions or concerns.  A:  Support and encouragement was offered. 15 min checks continued for safety.  R: Pt remains safe.

## 2015-08-01 NOTE — Progress Notes (Signed)
Patient ID: Mike Moran, male   DOB: 1955/04/24, 60 y.o.   MRN: [id number] [facility]  08/01/2015 10:57 AM Mike BlazingJohnny C Labrecque  MRN:  [id number] Subjective:  Patient was seen in his room and EMR reviewed. Patient reports feeling slightly better, easily roused from bed. He presents with more energy, states he ate his breakfast. States he feels sad and wants to get his medication right.Patient tearful when he talks about his family. He continues to have suicidal thoughts but able to contract for safety. He is worried about his job. He became tearful when talking about what a burden he is to his family.   Principal Problem: MDD with psychotic symptoms Diagnosis:   Patient Active Problem List   Diagnosis Date Noted  . Severe episode of recurrent major depressive disorder, without psychotic features (HCC) [F33.2]   . MDD (major depressive disorder), recurrent episode, severe (HCC) [F33.2] 07/30/2015  . GAD (generalized anxiety disorder) [F41.1]   . MDD (major depressive disorder), severe (HCC) [F32.2] 01/09/2014   Total Time spent with patient: 20 minutes  Past Psychiatric History: Long history of depression  Past Medical History:  Past Medical History  Diagnosis Date  . Depression    History reviewed. No pertinent past surgical history. Family History: History reviewed. No pertinent family history. Family Psychiatric  History: Unknown Social History:  History  Alcohol Use  . 0.0 oz/week  . 0 Standard drinks or equivalent per week    Comment: occ- pt denies SA use     History  Drug Use No    Social History   Social History  . Marital Status: Married    Spouse Name: N/A  . Number of Children: N/A  . Years of Education: N/A   Social History Main Topics  . Smoking status: Former Games developermoker  . Smokeless tobacco: None  . Alcohol Use: 0.0 oz/week    0 Standard drinks or equivalent per week     Comment: occ- pt denies SA use  . Drug Use: No  . Sexual Activity: Not Asked    Other Topics Concern  . None   Social History Narrative   Additional Social History:    Pain Medications: see PTA meds Prescriptions: see PTA meds Over the Counter: see PTA meds History of alcohol / drug use?: No history of alcohol / drug abuse            Sleep: Fair  Appetite:  Poor  Current Medications: Current Facility-Administered Medications  Medication Dose Route Frequency Provider Last Rate Last Dose  . acetaminophen (TYLENOL) tablet 650 mg  650 mg Oral Q6H PRN Thermon LeylandLaura A Davis, NP      . aspirin EC tablet 81 mg  81 mg Oral Daily Thermon LeylandLaura A Davis, NP   81 mg at 08/01/15 0827  . feeding supplement (ENSURE ENLIVE) (ENSURE ENLIVE) liquid 237 mL  237 mL Oral BID BM Rockey SituFernando A Cobos, MD   237 mL at 08/01/15 1033  . ibuprofen (ADVIL,MOTRIN) tablet 600 mg  600 mg Oral Q8H PRN Thermon LeylandLaura A Davis, NP      . LORazepam (ATIVAN) tablet 1 mg  1 mg Oral Q8H PRN Thermon LeylandLaura A Davis, NP   1 mg at 07/31/15 0204  . magnesium hydroxide (MILK OF MAGNESIA) suspension 30 mL  30 mL Oral Daily PRN Thermon LeylandLaura A Davis, NP      . nicotine (NICODERM CQ - dosed in mg/24 hours) patch 21 mg  21 mg Transdermal Daily PRN Thermon LeylandLaura A Davis,  NP      . ondansetron (ZOFRAN) tablet 4 mg  4 mg Oral Q8H PRN Thermon LeylandLaura A Davis, NP   4 mg at 07/30/15 1258  . risperiDONE (RISPERDAL) tablet 0.25 mg  0.25 mg Oral BID Morris Markham, MD   0.25 mg at 08/01/15 0827  . traZODone (DESYREL) tablet 50 mg  50 mg Oral QHS PRN,MR X 1 Spencer E Simon, PA-C   50 mg at 07/31/15 2228  . zolpidem (AMBIEN) tablet 5 mg  5 mg Oral QHS PRN Thermon LeylandLaura A Davis, NP        Lab Results:  No results found for this or any previous visit (from the past 48 hour(s)).  Blood Alcohol level:  Lab Results  Component Value Date   [facility] 07/29/2015   ETH <11 01/09/2014    Physical Findings: AIMS: Facial and Oral Movements Muscles of Facial Expression: None, normal Lips and Perioral Area: None, normal Jaw: None, normal Tongue: None, normal,Extremity Movements Upper  (arms, wrists, hands, fingers): None, normal Lower (legs, knees, ankles, toes): None, normal, Trunk Movements Neck, shoulders, hips: None, normal, Overall Severity Severity of abnormal movements (highest score from questions above): None, normal Incapacitation due to abnormal movements: None, normal Patient's awareness of abnormal movements (rate only patient's report): No Awareness, Dental Status Current problems with teeth and/or dentures?: No Does patient usually wear dentures?: No  CIWA:    COWS:     Musculoskeletal: Strength & Muscle Tone: flaccid Gait & Station: normal Patient leans: N/A  Psychiatric Specialty Exam: Physical Exam  ROS  Blood pressure 82/64, pulse 90, temperature 98.4 F (36.9 C), temperature source Oral, resp. rate 16, height 6' (1.829 m), weight 145 lb (65.772 kg).Body mass index is 19.66 kg/(m^2).  General Appearance: Disheveled  Eye Contact:  Minimal  Speech:  More clear today  Volume:  Decreased  Mood:  Depressed, Dysphoric and Hopeless  Affect:  Depressed and Flat  Thought Process:  coherent  Orientation:  Full (Time, Place, and Person)  Thought Content:  Hallucinations: Auditory and Rumination  Suicidal Thoughts:  Yes.  without intent/plan  Homicidal Thoughts:  No  Memory:  Immediate;   Poor Recent;   Poor Remote;   Poor  Judgement:  Impaired  Insight:  Lacking  Psychomotor Activity:  Decreased and Psychomotor Retardation  Concentration:  Concentration: Poor and Attention Span: Poor  Recall:  fair  Fund of Knowledge:  fair  Language:  fair  Akathisia:  No  Handed:  Right  AIMS (if indicated):     Assets:  Housing Physical Health Social Support  ADL's:  Impaired  Cognition:  WNL  Sleep:  Number of Hours: 6     Treatment Plan Summary: Daily contact with patient to assess and evaluate symptoms and progress in treatment and Medication management   Major depressive disorder with psychotic features  Continue Risperdal at 0.25 mg twice  daily Patient counselled on strategies to cope with current stressors and to take things slowly and focus on himself and getting better. Nursing to encourage patient to get out of bed and spent time in the day area and attending groups. Nursing to encourage patient to eat his meals and also to take daily shower. Continue to monitor closely  Patrick NorthAVI, Terri Rorrer, MD 08/01/2015, 10:57 AM

## 2015-08-01 NOTE — Progress Notes (Signed)
Mike BerkshireJohnny states his goal today was to talk to the doctor and to try out his new medicine. "I just really want to feel better". Rates Anxiety 7/10 and Depression 5/10. Endorses passive SI. Contracts for safety. Denies HI/AVH. Encouragement and support given. Medications administered as prescribed. Continue to monitor Q 15 minutes for patient safety and medication effectiveness.

## 2015-08-02 DIAGNOSIS — R45851 Suicidal ideations: Secondary | ICD-10-CM

## 2015-08-02 NOTE — Progress Notes (Signed)
Mike BerkshireJohnny states his goal is "to get out of here Monday". He rates Anxiety 0/10 and Depression 1/10. Denies SI/HI/AVH. Encouragement and support given. Medications administered as prescribed. Continue to monitor Q 15 minutes for patient safety and medication effectiveness.

## 2015-08-02 NOTE — [facility] Group Notes (Signed)
[facility] LCSW Group Therapy  08/02/2015 10:00 AM  Type of Therapy:  Group  Participation Level:  Did Not Attend   [name], Prosper Paff J 08/02/2015, 1:01 PM

## 2015-08-02 NOTE — [facility] Group Notes (Signed)
[facility] Group Notes:  (Nursing/MHT/Case Management/Adjunct)  Date:  08/02/2015  Time:  9:49 AM  Type of Therapy:  Nurse Education  Participation Level:  Did Not Attend  Summary of Progress/Problems:  Did not attend despite invitation  Maurine SimmeringShugart, Fordyce Lepak M 08/02/2015, 9:49 AM

## 2015-08-02 NOTE — Progress Notes (Signed)
D: Mike BerkshireJohnny has been pleasant and cooperative this a.m. He reported not "sleeping a wink" last night, which he attributed to taking mag citrate with much success. He denied SI/HI/AVH/pain. On his self inventory, he rated his depression 1/10, hopelessness 0/10, and anxiety 0/10. He reported good appetite, normal energy level, and good concentration. He has been sleeping for much of the morning. A: Meds given as ordered. Q15 safety checks maintained. Support/encouragement offered. R: Pt remains free from harm and continues with treatment. Will continue to monitor for needs/safety.
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
## 2015-08-03 NOTE — Progress Notes (Signed)
[facility]  08/03/2015 4:46 PM Mike BlazingJohnny C Moran  MRN:  [id number]   Subjective:  Patient had a bad dream last night.  Denies history and reported it was the first time this happened.   Objective:  Patient is asking to be discharged.  He states that he will follow up with Dr Louie Bunosenbaugh that will further manage his meds.  Patient feels strongly that his Risperdal is best med for himself.   Principal Problem: MDD with psychotic symptoms Diagnosis:   Patient Active Problem List   Diagnosis Date Noted  . Severe episode of recurrent major depressive disorder, without psychotic features (HCC) [F33.2]   . MDD (major depressive disorder), recurrent episode, severe (HCC) [F33.2] 07/30/2015  . GAD (generalized anxiety disorder) [F41.1]   . MDD (major depressive disorder), severe (HCC) [F32.2] 01/09/2014   Total Time spent with patient: 20 minutes  Past Psychiatric History: Long history of depression  Past Medical History:  Past Medical History  Diagnosis Date  . Depression    History reviewed. No pertinent past surgical history. Family History: History reviewed. No pertinent family history. Family Psychiatric  History: Unknown Social History:  History  Alcohol Use  . 0.0 oz/week  . 0 Standard drinks or equivalent per week    Comment: occ- pt denies SA use     History  Drug Use No    Social History   Social History  . Marital Status: Married    Spouse Name: N/A  . Number of Children: N/A  . Years of Education: N/A   Social History Main Topics  . Smoking status: Former Games developermoker  . Smokeless tobacco: None  . Alcohol Use: 0.0 oz/week    0 Standard drinks or equivalent per week     Comment: occ- pt denies SA use  . Drug Use: No  . Sexual Activity: Not Asked   Other Topics Concern  . None   Social History Narrative   Additional Social History:    Pain Medications: see PTA meds Prescriptions: see PTA meds Over the Counter: see PTA meds History of alcohol / drug  use?: No history of alcohol / drug abuse            Sleep: Fair  Appetite:  Poor  Current Medications: Current Facility-Administered Medications  Medication Dose Route Frequency Provider Last Rate Last Dose  . acetaminophen (TYLENOL) tablet 650 mg  650 mg Oral Q6H PRN Thermon LeylandLaura A Davis, NP      . aspirin EC tablet 81 mg  81 mg Oral Daily Thermon LeylandLaura A Davis, NP   81 mg at 08/03/15 [id number]  . feeding supplement (ENSURE ENLIVE) (ENSURE ENLIVE) liquid 237 mL  237 mL Oral BID BM Craige CottaFernando A Cobos, MD   237 mL at 08/02/15 0812  . ibuprofen (ADVIL,MOTRIN) tablet 600 mg  600 mg Oral Q8H PRN Thermon LeylandLaura A Davis, NP      . LORazepam (ATIVAN) tablet 1 mg  1 mg Oral Q8H PRN Thermon LeylandLaura A Davis, NP   1 mg at 07/31/15 0204  . magnesium hydroxide (MILK OF MAGNESIA) suspension 30 mL  30 mL Oral Daily PRN Thermon LeylandLaura A Davis, NP   30 mL at 08/01/15 1436  . nicotine (NICODERM CQ - dosed in mg/24 hours) patch 21 mg  21 mg Transdermal Daily PRN Thermon LeylandLaura A Davis, NP      . ondansetron [facility](ZOFRAN) tablet 4 mg  4 mg Oral Q8H PRN Thermon LeylandLaura A Davis, NP   4 mg at 07/30/15 1258  .  risperiDONE (RISPERDAL) tablet 0.25 mg  0.25 mg Oral BID Himabindu Ravi, MD   0.25 mg at 08/03/15 1623  . traZODone (DESYREL) tablet 50 mg  50 mg Oral QHS PRN,MR X 1 Spencer E Simon, PA-C   50 mg at 08/02/15 2103  . zolpidem (AMBIEN) tablet 5 mg  5 mg Oral QHS PRN Thermon LeylandLaura A Davis, NP        Lab Results:  No results found for this or any previous visit (from the past 48 hour(s)).  Blood Alcohol level:  Lab Results  Component Value Date   [facility] 07/[facility]   ETH <11 01/09/2014    Physical Findings: AIMS: Facial and Oral Movements Muscles of Facial Expression: None, normal Lips and Perioral Area: None, normal Jaw: None, normal Tongue: None, normal,Extremity Movements Upper (arms, wrists, hands, fingers): None, normal Lower (legs, knees, ankles, toes): None, normal, Trunk Movements Neck, shoulders, hips: None, normal, Overall Severity Severity of abnormal movements  (highest score from questions above): None, normal Incapacitation due to abnormal movements: None, normal Patient's awareness of abnormal movements (rate only patient's report): No Awareness, Dental Status Current problems with teeth and/or dentures?: No Does patient usually wear dentures?: No  CIWA:    COWS:     Musculoskeletal: Strength & Muscle Tone: flaccid Gait & Station: normal Patient leans: N/A  Psychiatric Specialty Exam: Physical Exam  ROS  Blood pressure 109/72, pulse 90, temperature 99 F (37.2 C), temperature source Oral, resp. rate 16, height 6' (1.829 m), weight 65.772 kg (145 lb).Body mass index is 19.66 kg/(m^2).  General Appearance: Disheveled  Eye Contact:  Minimal  Speech:  More clear today  Volume:  Decreased  Mood:  Depressed, Dysphoric and Hopeless  Affect:  Depressed and Flat  Thought Process:  coherent  Orientation:  Full (Time, Place, and Person)  Thought Content:  Hallucinations: Auditory and Rumination  Suicidal Thoughts:  Yes.  without intent/plan  Homicidal Thoughts:  No  Memory:  Immediate;   Poor Recent;   Poor Remote;   Poor  Judgement:  Impaired  Insight:  Lacking  Psychomotor Activity:  Decreased and Psychomotor Retardation  Concentration:  Concentration: Poor and Attention Span: Poor  Recall:  fair  Fund of Knowledge:  fair  Language:  fair  Akathisia:  No  Handed:  Right  AIMS (if indicated):     Assets:  Housing Physical Health Social Support  ADL's:  Impaired  Cognition:  WNL  Sleep:  Number of Hours: 6   Treatment Plan Summary: Daily contact with patient to assess and evaluate symptoms and progress in treatment and Medication management   Major depressive disorder with psychotic features  Continue Risperdal at 0.25 mg twice daily Patient counselled on strategies to cope with current stressors and to take things slowly and focus on himself and getting better. Nursing to encourage patient to get out of bed and spent time in  the day area and attending groups. Nursing to encourage patient to eat his meals and also to take daily shower. Continue to monitor closely Consider discharge in the  Morning.  Lindwood QuaSheila May Agustin, NP [facility] 08/03/2015, 4:46 PM  Reviewed the information documented and agree with the treatment plan.  [facility]urg[facility] TowsonJANARDHANA Astra Sunn[facility] Community HospitalJONNALAGADDA 08/04/2015 12:32 PM

## 2015-08-03 NOTE — Progress Notes (Signed)
DAR NOTE: Pt present with flat affect and depressed mood in the unit. Pt has been isolating himself and has been bed most of the time. Pt denies physical pain, took all his meds as scheduled but was concerned about his anti psychotic medication which he say is giving him bad nightmares.  As per self inventory, pt had a poor night Sleep, poor  Appetite, normal energy, and good concentration. Pt rate depression at 0, hopeless ness at 0. Pt's safety ensured with 15 minute and environmental checks. Pt currently denies SI/HI and A/V hallucinations. Pt verbally agrees to seek staff if SI/HI or A/VH occurs and to consult with staff before acting on these thoughts. Will continue POC.

## 2015-08-03 NOTE — [facility] Group Notes (Signed)
[facility] Group Notes:  (Nursing/MHT/Case Management/Adjunct)  Date:  08/03/2015  Time:  12:08 PM  Type of Therapy:  Psychoeducational Skills  Participation Level:  Did Not Attend  Participation Quality:  DID NOT ATTEND  Affect:  DID NOT ATTEND  Cognitive:  DID NOT ATTEND  Insight:  None  Engagement in Group:  DID NOT ATTEND  Modes of Intervention:  DID NOT ATTEND  Summary of Progress/Problems: Pt did not attend patient self inventory group.   Bethann PunchesJane O Janyia Guion 08/03/2015, 12:08 PM

## 2015-08-03 NOTE — [facility] Group Notes (Signed)
08/03/2015 10:00 AM  Type of Therapy:  Group Therapy  Participation Level:  Did Not Attend   [name], Mike Moran 08/03/2015, 1:01 PM

## 2015-08-04 ENCOUNTER — Encounter (HOSPITAL_COMMUNITY): Payer: Self-pay | Admitting: Psychiatry

## 2015-08-04 DIAGNOSIS — F411 Generalized anxiety disorder: Secondary | ICD-10-CM

## 2015-08-04 MED ORDER — ZOLPIDEM TARTRATE 5 MG PO TABS: 5.0000 mg | ORAL_TABLET | Freq: Every evening | ORAL | Status: DC | PRN

## 2015-08-04 MED ORDER — BOOST HIGH PROTEIN PO LIQD: 1.0000 | Freq: Three times a day (TID) | ORAL | Status: DC

## 2015-08-04 MED ORDER — TRAZODONE HCL 50 MG PO TABS: 50.0000 mg | ORAL_TABLET | Freq: Every evening | ORAL | Status: DC | PRN

## 2015-08-04 MED ORDER — ASPIRIN EC 81 MG PO TBEC: 81.0000 mg | DELAYED_RELEASE_TABLET | Freq: Every day | ORAL | Status: DC

## 2015-08-04 MED ORDER — RISPERIDONE 0.25 MG PO TABS: 0.2500 mg | ORAL_TABLET | Freq: Two times a day (BID) | ORAL | Status: DC

## 2015-08-04 MED ORDER — NICOTINE 21 MG/24HR TD PT24: 21.0000 mg | MEDICATED_PATCH | Freq: Every day | TRANSDERMAL | Status: DC | PRN

## 2015-08-04 NOTE — Progress Notes (Signed)
Pt discharged home with his daughter and wife. Pt was ambulatory, stable and appreciative at that time. All papers and prescriptions were given and valuables returned. Verbal understanding expressed. Denies SI/HI and A/VH. Pt given opportunity to express concerns and ask questions.

## 2015-08-04 NOTE — Progress Notes (Signed)
  [facility] :  Will you be returning to the same living situation after discharge:  Yes,  Pt returning home At discharge, do you have transportation home?: Yes,  Pt family to provide transportation Do you have the ability to pay for your medications: Yes,  Pt provided with prescription  Release of information consent forms completed and in the chart;  Patient's signature needed at discharge.  Patient to Follow up at: Follow-up Information    [facility] with [facility].   Specialty:  Behavioral Health   Why:  6/29 at 8:00am for medication management with Dr. Tenny Crawoss. Dr. Kieth Brightlyodenbough is out of the office for two weeks so his first available was 8/1 at 1:45pm.   Contact information:   [address] [place] [id number] [phone]      Next level of care provider has access to [facility]:yes  Safety Planning and Suicide Prevention discussed: Yes,  with wife; see SPE note  Have you used any form of tobacco in the last 30 days? (Cigarettes, Smokeless Tobacco, Cigars, and/or Pipes): No  Has patient been referred to the Quitline?: N/A patient is not a smoker  Patient has been referred for addiction treatment: N/A  Elaina HoopsCarter, Jaiven Graveline M 08/04/2015, 11:23 AM

## 2015-08-04 NOTE — Progress Notes (Signed)
Recreation Therapy Notes  Date: 06.26.2017 Time: 9:30am Location: [address] Group Room   Group Topic: Stress Management  Goal Area(s) Addresses:  Patient will actively participate in stress management techniques presented during session.   Behavioral Response: Did not attend.   Marykay Lexenise L Itamar Mcgowan, LRT/CTRS        Cobie Leidner L 08/04/2015 10:23 AM

## 2015-08-04 NOTE — Tx Team (Signed)
Interdisciplinary Treatment Plan Update (Adult) Date: 08/04/2015   Date: 08/04/2015 11:04 AM  Progress in Treatment:  Attending groups: Yes  Participating in groups: Yes  Taking medication as prescribed: Yes  Tolerating medication: Yes  Family/Significant othe contact made: Yes with wife and daughter Patient understands diagnosis: Yes AEB seeking help with depression Discussing patient identified problems/goals with staff: Yes  Medical problems stabilized or resolved: Yes  Denies suicidal/homicidal ideation: Yes Patient has not harmed self or Others: Yes   New problem(s) identified: None identified at this time.   Discharge Plan or Barriers: Pt will return home and follow-up with outpatient services.   Additional comments:  Patient and CSW reviewed pt's identified goals and treatment plan. Patient verbalized understanding and agreed to treatment plan.   Reason for Continuation of Hospitalization:  Anxiety Depression Medication stabilization Suicidal ideation   Estimated length of stay: 0 days  Review of initial/current patient goals per problem list:   1.  Goal(s): Patient will participate in aftercare plan  Met:  Yes  Target date: 3-5 days from date of admission   As evidenced by: Patient will participate within aftercare plan AEB aftercare provider and housing plan at discharge being identified.  08/04/2015:  Pt will return home and follow-up with outpatient services.   2.  Goal (s): Patient will exhibit decreased depressive symptoms and suicidal ideations.  Met:  Yes  Target date: 3-5 days from date of admission   As evidenced by: Patient will utilize self rating of depression at 3 or below and demonstrate decreased signs of depression or be deemed stable for discharge by MD.  08/04/15: Pt rates depression at 0/10 and significant improvement observed in mood and affect since admission.  3.  Goal(s): Patient will demonstrate decreased signs and symptoms of  anxiety.  Met:  Yes  Target date: 3-5 days from date of admission   As evidenced by: Patient will utilize self rating of anxiety at 3 or below and demonstrated decreased signs of anxiety, or be deemed stable for discharge by MD  08/04/2015: Pt rates anxiety at 0/10 and significant improvement observed in mood and affect since admission.  Attendees:  Patient:    Family:    Physician: Dr. Shea Evans, MD  08/04/2015 11:04 AM  Nursing: Eulogio Bear, RN 08/04/2015 11:04 AM  Clinical Social Worker Peri Maris, [place] 08/04/2015 11:04 AM  Other: Erasmo Downer Drinkard, [place] 08/04/2015 11:04 AM  Clinical:  08/04/2015 11:04 AM  Other:  08/04/2015 11:04 AM  Other:     Peri Maris, [place] Social Work [phone]

## 2015-08-04 NOTE — Discharge Summary (Signed)
Physician Discharge Summary Note  Patient:  Mike Moran is an 60 y.o., male MRN:  [id number] DOB:  1955-10-09 Patient phone:  [phone] (home)  Patient address:   [address] [place] [address],  Total Time spent with patient: Greater than 30 minutes  Date of Admission:  07/30/2015 Date of Discharge: 08/04/15  Reason for Admission:  Mood stabilization treatment  Discharge Diagnoses: Principal Problem:   Severe episode of recurrent major depressive disorder, without psychotic features (HCC) Active Problems:   GAD (generalized anxiety disorder)   Psychiatric Specialty Exam: Physical Exam  Constitutional: He appears well-developed.  HENT:  Head: Normocephalic.  Eyes: Pupils are equal, round, and reactive to light.  Neck: Normal range of motion.  Cardiovascular: Normal rate.   Respiratory: Effort normal.  Genitourinary:  Denies any issues in this area  Musculoskeletal: Normal range of motion.  Neurological: He is alert.  Skin: Skin is warm.  Psychiatric: His speech is normal and behavior is normal. Judgment and thought content normal. His mood appears not anxious. His affect is not angry, not blunt, not labile and not inappropriate. Cognition and memory are normal. He does not exhibit a depressed mood.    Review of Systems  Constitutional: Negative.   HENT: Negative.   Eyes: Negative.   Respiratory: Negative.   Cardiovascular: Negative.   Gastrointestinal: Negative.   Genitourinary: Negative.   Musculoskeletal: Negative.   Skin: Negative.   Neurological: Negative.   Endo/Heme/Allergies: Negative.   Psychiatric/Behavioral: Positive for depression (sTABLE). Negative for suicidal ideas, hallucinations, memory loss and substance abuse. The patient has insomnia (Stable). The patient is not nervous/anxious.     Blood pressure 112/71, pulse 81, temperature 98.9 F (37.2 C), temperature source Oral, resp. rate 16, height 6' (1.829 m), weight 65.772 kg (145  lb).Body mass index is 19.66 kg/(m^2).  See Md's SRA  Past Psychiatric History: Diagnosis: denies any prior history of depressive episodes, denies any history of mania or psychosis  Hospitalizations: This is his first psychiatric admission  Outpatient Care: His PCP has been prescribing medications  Substance Abuse Care: none   Self-Mutilation: No history of self injurious behaviors   Suicidal Attempts: No prior history of suicide attempts   Violent Behaviors: Denies    Musculoskeletal: Strength & Muscle Tone: within normal limits Gait & Station: normal Patient leans: N/A  Depressive Disorders:   Major Depressive Disorder - Severe (296.23), Generalized anxiety disorder  Past Medical History  Diagnosis Date  . Depression    Level of Care:  OP  Hospital Course:  Mike Moran is a 60 y.o. male who presents to [facility] under IVC, taken out by EDP Campos due to a suicide attempt. Pt completed assessment lying on bed, with his eyes closed. He indicated to Clinical research associate at beginning of assessment that he didn't want to talk about anything. Pt's wife, Boyd Kerbs, was in the room and provided all of the information for the assessment. Writer advised pt that he was welcomed to interject at any time to clarify or correct what his wife disclosed. Wife shared that pt just started on Effexor @ 2 weeks ago and his symptoms have seemed to worsen w/in that time. She indicated that pt has been medication compliant. Wife shared that pt has had issues with depression for the past several years, due to family conflicts and his father's death. 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 Wife shared that this morning, she could tell pt wasn't himself and she found him in the spare bedroom with a shotgun in his hand. When he saw her, he tried to hide the gun. Wife reported that she  took the shotgun and the other gun they have in the house from him and he was able to get the other gun away from her and run outside and that's when she called the police. "  Mike Moran was admitted to the hospital for worsening symptoms of depression & suicidal ideation with plans to shoot himself. Apparently, Mike Moran recently retired form his job, but experienced some difficulties adjusting to his retirement. This worsened his depression triggering the suicidal ideations. His wife stated on admission that he was taking Wellbutrin for his depression, which he thought was not helping as he found himself more depressed. Mike Moran needed medication adjustment to re-stabilize his depressed mood.  While a patient in this hospital, Mike Moran's Wellbutrin was not resumed. He was started on Risperdal 0.25 mg for mood control, Trazodone 50 mg for insomnia & Ambien 5 mg prn for insomnia. He was enrolled and participated in the group counseling sessions being offered and held on this unit. He learned coping skills. He was resumed on his Aspirin 81 mg for heart health. He presented no other significant pre-existing medical issues that required treatments and or monitoring.    Mike Moran's symptoms responded well to his treatment regimen. This is evidenced by his reports of improved mood, reduction of symptoms & presentation of good affect. He is currently mentally & medically stable to be discharged to continue mental health care on an outpatient basis. He will follow-up care for medication management, counseling and routine psychiatric [facility] in [place], [place]. He is provided with all the pertinent information required to make this appointment without problems. Upon discharge, he adamantly denies any SIHI, AVH, delusional thoughts and or paranoia. [facility] belongings in no apparent distress. Transportation per patient's arrangement (wife)..  Consults:  psychiatry  Discharge Vitals:    Blood pressure 112/71, pulse 81, temperature 98.9 F (37.2 C), temperature source Oral, resp. rate 16, height 6' (1.829 m), weight 65.772 kg (145 lb). Body mass index is 19.66 kg/(m^2). Lab Results:   No results found for this or any previous visit (from the past 72 hour(s)).  Physical Findings:  AIMS: Facial and Oral Movements Muscles of Facial Expression: None, normal Lips and Perioral Area: None, normal Jaw: None, normal Tongue: None, normal,Extremity Movements Upper (arms, wrists, hands, fingers): None, normal Lower (legs, knees, ankles, toes): None, normal, Trunk Movements Neck, shoulders, hips: None, normal, Overall Severity Severity of abnormal movements (highest score from questions above): None, normal Incapacitation due to abnormal movements: None, normal Patient's awareness of abnormal movements (rate only patient's report): No Awareness, Dental Status Current problems with teeth and/or dentures?: No Does patient usually wear dentures?: No  CIWA:    COWS:     Psychiatric Specialty Exam: See Psychiatric Specialty Exam and Suicide Risk Assessment completed by Attending Physician prior to discharge.  Discharge destination:  Home  Is patient on multiple antipsychotic therapies at discharge:  No   Has Patient had three or more failed trials of antipsychotic monotherapy by history:  No  Recommended Plan for Multiple Antipsychotic Therapies: NA    Medication List    STOP taking these medications        multivitamin capsule     venlafaxine XR 75  MG 24 hr capsule  Commonly known as:  EFFEXOR XR      TAKE these medications      Indication   aspirin EC 81 MG tablet  Take 1 tablet (81 mg total) by mouth daily. For heart health   Indication:  Heart health     feeding supplement Liqd  Take 237 mLs by mouth 3 (three) times daily between meals. For nutritional supplement   Indication:  Nutritional supplement     nicotine 21 mg/24hr patch  Commonly known as:   NICODERM CQ - dosed in mg/24 hours  Place 1 patch (21 mg total) onto the skin daily as needed (tobacco abuse). For smoking cessation   Indication:  Nicotine Addiction     risperiDONE 0.25 MG tablet  Commonly known as:  RISPERDAL  Take 1 tablet (0.25 mg total) by mouth 2 (two) times daily. For mood control   Indication:  Mood control     traZODone 50 MG tablet  Commonly known as:  DESYREL  Take 1 tablet (50 mg total) by mouth at bedtime as needed and may repeat dose one time if needed for sleep.   Indication:  Trouble Sleeping     zolpidem 5 MG tablet  Commonly known as:  AMBIEN  Take 1 tablet (5 mg total) by mouth at bedtime as needed for sleep.   Indication:  Trouble Sleeping       Follow-up Information    Follow up with [facility].   Specialty:  Behavioral Health   Why:  6/29 at 8:00am for medication management with Dr. Tenny Crawoss. Dr. Kieth Brightlyodenbough is out of the office for two weeks so his first available was 8/1 at 1:45pm.   Contact information:   [address] [place] [id number] [phone]    Follow-up recommendations:  Activity:  As tolerated Diet: As recommended by your primary care doctor. Keep all scheduled follow-up appointments as recommended.  Comments:  Take all your medications as prescribed by your mental healthcare provider. Report any adverse effects and or reactions from your medicines to your outpatient provider promptly. Patient is instructed and cautioned to not engage in alcohol and or illegal drug use while on prescription medicines. In the event of worsening symptoms, patient is instructed to call the crisis hotline, 911 and or go to the nearest ED for appropriate evaluation and treatment of symptoms. Follow-up with your primary care provider for your other medical issues, concerns and or health care needs.   Total Discharge Time:  Greater than 30 minutes.  SignedSanjuana Kava: Annice Jolly I,  PMHNP-BC 08/04/2015, 5:26 PM

## 2015-08-04 NOTE — Progress Notes (Signed)
D. Pt had been up and visible in milieu this evening, and attended evening activity. Pt spoke about how he was feeling better, pt did not receive any bedtime medications as none were scheduled and did not want any sleep medications and spoke about how if he needed them he would come and ask for them later. A. Support provided. R. Safety maintained, will continue to monitor.

## 2015-08-04 NOTE — [facility]HH Suicide Risk Assessment (Signed)
[facility] Discharge Suicide Risk Assessment   Principal Problem: Severe episode of recurrent major depressive disorder, without psychotic features [facility](HCC) Discharge Diagnoses:  Patient Active Problem List   Diagnosis Date Noted  . Severe episode of recurrent major depressive disorder, without psychotic features (HCC) [F33.2]   . GAD (generalized anxiety disorder) [F41.1]     Total Time spent with patient: 30 minutes  Musculoskeletal: Strength & Muscle Tone: within normal limits Gait & Station: normal Patient leans: N/A  Psychiatric Specialty Exam: Review of Systems  Psychiatric/Behavioral: Negative for depression. The patient is not nervous/anxious.   All other systems reviewed and are negative.   Blood pressure 112/71, pulse 81, temperature 98.9 F (37.2 C), temperature source Oral, resp. rate 16, height 6' (1.829 m), weight 65.772 kg (145 lb).Body mass index is 19.66 kg/(m^2).  General Appearance: Casual  Eye Contact::  Fair  Speech:  Normal Rate409  Volume:  Normal  Mood:  Euthymic  Affect:  Congruent  Thought Process:  Goal Directed and Descriptions of Associations: Intact  Orientation:  Full (Time, Place, and Person)  Thought Content:  Logical  Suicidal Thoughts:  No  Homicidal Thoughts:  No  Memory:  Immediate;   Fair Recent;   Fair Remote;   Fair  Judgement:  Fair  Insight:  Fair  Psychomotor Activity:  Normal  Concentration:  Fair  Recall:  FiservFair  Fund of Knowledge:Fair  Language: Fair  Akathisia:  No  Handed:  Right  AIMS (if indicated):     Assets:  Communication Skills Desire for Improvement  Sleep:  Number of Hours: 6.75  Cognition: WNL  ADL's:  Intact   Mental Status Per Nursing Assessment::   On Admission:     Demographic Factors:  Male and Caucasian  Loss Factors: NA  Historical Factors: Impulsivity  Risk Reduction Factors:   Positive social support  Continued Clinical Symptoms:  Previous Psychiatric Diagnoses and Treatments  Cognitive  Features That Contribute To Risk:  None    Suicide Risk:  Minimal: No identifiable suicidal ideation.  Patients presenting with no risk factors but with morbid ruminations; may be classified as minimal risk based on the severity of the depressive symptoms  Follow-up Information    Follow up with [facility].   Specialty:  Behavioral Health   Why:  6/29 at 8:00am for medication management with Dr. Tenny Crawoss. Dr. Kieth Brightlyodenbough is out of the office for two weeks so his first available was 8/1 at 1:45pm.   Contact information:   [address] [place] [id number] [phone]      Plan Of Care/Follow-up recommendations:  Activity:  no restrictions Diet:  regular Tests:  as needed Other:  follow up with aftercare  Piotr Christopher, MD 08/04/2015, 11:05 AM

## 2015-08-07 ENCOUNTER — Encounter (HOSPITAL_COMMUNITY): Payer: Self-pay | Admitting: Psychiatry

## 2015-08-07 ENCOUNTER — Ambulatory Visit (INDEPENDENT_AMBULATORY_CARE_PROVIDER_SITE_OTHER): Payer: 59 | Admitting: Psychiatry

## 2015-08-07 VITALS — BP 123/77 | HR 68 | Ht 72.0 in | Wt 153.2 lb

## 2015-08-07 DIAGNOSIS — F331 Major depressive disorder, recurrent, moderate: Secondary | ICD-10-CM

## 2015-08-07 MED ORDER — RISPERIDONE 0.25 MG PO TABS: 0.2500 mg | ORAL_TABLET | Freq: Two times a day (BID) | ORAL | Status: DC

## 2015-08-07 MED ORDER — TRAZODONE HCL 50 MG PO TABS: 50.0000 mg | ORAL_TABLET | Freq: Every evening | ORAL | Status: DC | PRN

## 2015-08-07 MED ORDER — DULOXETINE HCL 60 MG PO CPEP: 60.0000 mg | ORAL_CAPSULE | Freq: Every day | ORAL | Status: DC

## 2015-08-07 NOTE — Progress Notes (Signed)
Patient ID: Mike Moran, male   DOB: 1955-03-07, 60 y.o.   MRN: [id number] Patient ID: Mike Moran, male   DOB: 1955/07/27, 60 y.o.   MRN: [id number] Patient ID: Mike Moran, male   DOB: 05/08/1955, 60 y.o.   MRN: [id number] Patient ID: Mike Moran, male   DOB: [birth year], 60 y.o.   MRN: [id number] Patient ID: Mike Moran, male   DOB: [birth year], 60 y.o.   MRN: [id number] Patient ID: Mike Moran, male   DOB: [birth year], 60 y.o.   MRN: [id number] Patient ID: Mike Moran, male   DOB: 1955/07/07, 60 y.o.   MRN: [id number]  [facility] [id number] Progress Note   Mike Moran [id number] 60 y.o.  08/07/2015 8:48 AM  Chief Complaint:  I am doing better. I'm back to myself  History of Present Illness:  Mike Moran came for his follow-up appointment.  He was seen first time on December 17.  He was referred from inpatient psychiatric treatment.  He was discharged on multiple medication .  We recommended him to increase Effexor and stop using Ativan every day and Vistaril only if needed.  His trazodone was increased because he was complaining of poor sleep.  He is feeling much better.  Today he came with his wife.  He endorsed improvement in his sleep and depression.  He denies any crying spells or any panic attack.  He denies any active or passive suicidal thoughts or homicidal thought.  He mentioned his family issues also getting better.  He has a good support from his family member and he believed that things are going very well.  Patient denies any side effects of medication.  He denies any feeling of hopelessness or worthlessness.  He was scheduled to see Dr. Shelva Majestic however he has decided not to see counselor because his symptoms are getting better.  Patient is scheduled to see Dr. Tenny Craw on February 2.  Patient denies drinking or using any illegal substances.  He has no concern with the medication and denies any tremors or shakes.  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  His appetite is okay.  His vitals are stable.  Patient returns after 3 weeks. Unfortunately he was rehospitalized at the behavioral health hospital. On June 20 he made a suicide attempt with a shotgun and had to be involuntarily committed. He states that he felt overwhelmed with his work and the feeling that he had to keep doing work that was hard on him to support his family. He didn't feel like he could go on anymore. He had recently gotten back on Effexor and was having a lot of side effects like his head burning. He was hospitalized until June 26. He was not restarted on any antidepressants but was given Risperdal and trazodone. I explained to him that he definitely needed to be back on medicine for depression and he and his family agree. He thought he might of been hearing voices and was having racing thoughts but the Risperdal seems to stop this. He was sleeping well with the trazodone. He is eating better and has gained a few pounds. He denies being suicidal today. His family has removed all the guns from the home. We decided today to continue the Risperdal and trazodone and add Cymbalta as his antidepressant Suicidal Ideation:  No Plan Formed: No Patient has means to carry out plan: No  Homicidal Ideation: No Plan Formed: No Patient has means to carry out plan: No  Past Psychiatric History/Hospitalization(s) Patient has psychiatric hospitalization in December 2015 because of severe depression and having suicidal thoughts and plan to kill himself.  He put the gun on his head but did not pull the trigger.  He denies any other previous psychiatric inpatient treatment.  His primary care physician tried him on Wellbutrin, Lexapro and Ambien but he had a poor response.  He has taken Xanax and Valium 20 years ago when he had divorced from his first wife.  Patient denies any history of mania,  psychosis, hallucination Anxiety: Yes Bipolar Disorder: No Depression: Yes Mania: No Psychosis: No Schizophrenia: No Personality Disorder: No Hospitalization for psychiatric illness: Yes History of Electroconvulsive Shock Therapy: No Prior Suicide Attempts: No  Medical History; His primary care physician is Dr. Hilma Favors. He has high cholesterol.  ROS  Psychiatric: Agitation: No Hallucination: No Depressed Mood: No Insomnia: No Hypersomnia: No Altered Concentration: No Feels Worthless: No Grandiose Ideas: No Belief In Special Powers: No New/Increased Substance Abuse: No Compulsions: No  Neurologic: Headache: No Seizure: No Paresthesias: No   Musculoskeletal: Strength & Muscle Tone: within normal limits Gait & Station: normal Patient leans: N/A   Outpatient Encounter Prescriptions as of 08/07/2015  Medication Sig  . aspirin EC 81 MG tablet Take 1 tablet (81 mg total) by mouth daily. For heart health  . risperiDONE (RISPERDAL) 0.25 MG tablet Take 1 tablet (0.25 mg total) by mouth 2 (two) times daily. For mood control  . traZODone (DESYREL) 50 MG tablet Take 1 tablet (50 mg total) by mouth at bedtime as needed and may repeat dose one time if needed for sleep.  Marland Kitchen zolpidem (AMBIEN) 5 MG tablet Take 1 tablet (5 mg total) by mouth at bedtime as needed for sleep.  . [DISCONTINUED] risperiDONE (RISPERDAL) 0.25 MG tablet Take 1 tablet (0.25 mg total) by mouth 2 (two) times daily. For mood control  . [DISCONTINUED] traZODone (DESYREL) 50 MG tablet Take 1 tablet (50 mg total) by mouth at bedtime as needed and may repeat dose one time if needed for sleep.  . DULoxetine (CYMBALTA) 60 MG capsule Take 1 capsule (60 mg total) by mouth daily.  . [DISCONTINUED] feeding supplement (BOOST HIGH PROTEIN) LIQD Take 237 mLs by mouth 3 (three) times daily between meals. For nutritional supplement (Patient not taking: Reported on 08/07/2015)  . [DISCONTINUED] nicotine (NICODERM CQ - DOSED IN  MG/24 HOURS) 21 mg/24hr patch Place 1 patch (21 mg total) onto the skin daily as needed (tobacco abuse). For smoking cessation (Patient not taking: Reported on 08/07/2015)   No facility-administered encounter medications on file as of 08/07/2015.    Recent Results (from the past 2160 hour(s))  Comprehensive metabolic panel     Status: Abnormal   Collection Time: 07/29/15  8:57 AM  Result Value Ref Range   Sodium 138 135 - 145 mmol/L   Potassium 3.9 3.5 - 5.1 mmol/L   Chloride 105 101 - 111 mmol/L   CO2 26 22 - 32 mmol/L   Glucose, Bld 127 (H) 65 - 99 mg/dL   BUN 14 6 - 20 mg/dL   Creatinine, Ser 0.77 0.61 - 1.24 mg/dL   Calcium 9.3 8.9 - 10.3 mg/dL   Total Protein 7.1 6.5 - 8.1 g/dL   Albumin 4.3 3.5 - 5.0 g/dL   AST 18 15 - 41 U/L  ALT 19 17 - 63 U/L   Alkaline Phosphatase 39 38 - 126 U/L   Total Bilirubin 0.8 0.3 - 1.2 mg/dL   GFR calc non Af Amer >60 >60 mL/min   GFR calc Af Amer >60 >60 mL/min    Comment: (NOTE) The eGFR has been calculated using the CKD EPI equation. This calculation has not been validated in all clinical situations. eGFR's persistently <60 mL/min signify possible Chronic Kidney Disease.    Anion gap 7 5 - 15  Ethanol     Status: None   Collection Time: 07/29/15  8:57 AM  Result Value Ref Range   Alcohol, Ethyl (B) <5 <5 mg/dL    Comment:        LOWEST DETECTABLE LIMIT FOR SERUM ALCOHOL IS 5 mg/dL FOR MEDICAL PURPOSES ONLY   CBC     Status: Abnormal   Collection Time: 07/29/15  8:57 AM  Result Value Ref Range   WBC 10.9 (H) 4.0 - 10.5 K/uL   RBC 4.61 4.22 - 5.81 MIL/uL   Hemoglobin 14.4 13.0 - 17.0 g/dL   HCT 42.4 39.0 - 52.0 %   MCV 92.0 78.0 - 100.0 fL   MCH 31.2 26.0 - 34.0 pg   MCHC 34.0 30.0 - 36.0 g/dL   RDW 13.3 11.5 - 15.5 %   Platelets 389 150 - 400 K/uL  Urine rapid drug screen (hosp performed)not at [facility]     Status: Abnormal   Collection Time: 07/29/15  3:00 PM  Result Value Ref Range   Opiates NONE DETECTED NONE DETECTED    Cocaine NONE DETECTED NONE DETECTED   Benzodiazepines NONE DETECTED NONE DETECTED   Amphetamines NONE DETECTED NONE DETECTED   Tetrahydrocannabinol POSITIVE (A) NONE DETECTED   Barbiturates NONE DETECTED NONE DETECTED    Comment:        DRUG SCREEN FOR MEDICAL PURPOSES ONLY.  IF CONFIRMATION IS NEEDED FOR ANY PURPOSE, NOTIFY LAB WITHIN 5 DAYS.        LOWEST DETECTABLE LIMITS FOR URINE DRUG SCREEN Drug Class       Cutoff (ng/mL) Amphetamine      1000 Barbiturate      200 Benzodiazepine   782 Tricyclics       956 Opiates          300 Cocaine          300 THC              50       Constitutional:  BP 123/77 mmHg  Pulse 68  Ht 6' (1.829 m)  Wt 153 lb 3.2 oz (69.491 kg)  BMI 20.77 kg/m2  SpO2 97%   Mental Status Examination;  Patient is casually dressed and fairly groomed.  He is pleasant and cooperative.  He maintained good eye contact.  His his speech is fast, clear and coherent.  His thought processes logical and goal-directed. He isd easy to talk with although he is hard of hearing He described his mood as depressedAnd anxious and his affect is anxious as well. He looks very thin.  He denies any auditory or visual hallucination.  He denies any active or passive suicidal thoughts or homicidal thought.  His attention and concentration is fair.  There were no delusions, paranoia or any obsessive thoughts.  His fund of knowledge is good.  His psychomotor activity is normal. There were no tremors, shakes or any EPS.  His cognition is good.  There were no flight of ideas or any loose  association.  His insight judgment and impulse control is okay. His memory fund of knowledge and speech are all good   Established Problem, Stable/Improving (1), Review of Psycho-Social Stressors (1), Review and summation of old records (2), Review of Last Therapy Session (1), Review of Medication Regimen & Side Effects (2) and Review of New Medication or Change in Dosage (2)  Assessment: Axis I: Major  depressive disorder, recurrent.  Generalized anxiety disorder  Axis II: Deferred  Axis III:  Past Medical History  Diagnosis Date  . Depression      Plan:  The patient Will continue trazodone 50 mg at bedtime and may repeat once for sleep, Risperdal 0.25 mg twice a day for mood stabilization. He will start Cymbalta 60 mg daily and return to see me in 3 weeks. He may call at any time if his depression worsens. He has agreed to stop work because it's too stressful and go ahead and retire. I've written him a letter to stay out of work for the next 6 weeks. He will resume his therapy with Dr. Azzie Glatter, MD 08/07/2015

## 2015-08-08 ENCOUNTER — Telehealth (HOSPITAL_COMMUNITY): Payer: Self-pay | Admitting: *Deleted

## 2015-08-08 NOTE — Telephone Encounter (Signed)
Mike Moran with [facility] calling to verify if pt came to his appt with Dr. Tenny Crawoss on 08-07-15. Mike Moran number is [phone] ext [phone]. Verification was given and Mike Moran showed understanding.

## 2015-08-11 ENCOUNTER — Telehealth (HOSPITAL_COMMUNITY): Payer: Self-pay | Admitting: *Deleted

## 2015-08-11 NOTE — Telephone Encounter (Signed)
Pt called office due to previous phone call daughter made to office. Office informed pt that his daughter called office concerning his increase in his Xanax. He said that he wouldn't say that his anxiety increasing but he just feel nervous. Per pt, he talks to his daughter often but he just sometimes feels nervous. Per pt, he would like to see if Dr. Tenny Crawoss could prescribe him something that is not addictive that will help with his nervousness. Per pt, he remember being on Vistaril at one point and it worked but he can not remember the mg. Looked in pt chart and unable to see when he was last prescribed Vistaril. Pt number is [phone]. Informed pt that provider is out of office and office will respond back on Wednesday 08-13-15 and if this becomes an emergency to please go to the emergency. Pet agreed.

## 2015-08-11 NOTE — Telephone Encounter (Signed)
Pt daughter Claris GowerCharlotte called stating pt is having increased anxiety and would like to see if Dr. Tenny Crawoss could call in Vistaril. Per pt daughter pt was on this medication in the past and it seemed to help him. Asked pt daughter to have to have pt call office and she agreed. Per pt daughter, she will have pt call right now. Pt daughter number [phone].

## 2015-08-13 ENCOUNTER — Other Ambulatory Visit (HOSPITAL_COMMUNITY): Payer: Self-pay | Admitting: Psychiatry

## 2015-08-13 MED ORDER — HYDROXYZINE HCL 25 MG PO TABS: 25.0000 mg | ORAL_TABLET | Freq: Four times a day (QID) | ORAL | Status: DC | PRN

## 2015-08-13 NOTE — Telephone Encounter (Signed)
Let them know vistaril sent in

## 2015-08-13 NOTE — Telephone Encounter (Signed)
Called pt number that was provided during last phone call. lmtcb and office number provided

## 2015-08-18 ENCOUNTER — Telehealth (HOSPITAL_COMMUNITY): Payer: Self-pay | Admitting: *Deleted

## 2015-08-18 NOTE — Telephone Encounter (Signed)
patient left voice message with new phone number, [phone].

## 2015-08-19 ENCOUNTER — Telehealth (HOSPITAL_COMMUNITY): Payer: Self-pay | Admitting: *Deleted

## 2015-08-19 NOTE — Telephone Encounter (Signed)
left voice message, need to reschedule appointment for 08/28/15.

## 2015-08-20 ENCOUNTER — Telehealth (HOSPITAL_COMMUNITY): Payer: Self-pay | Admitting: *Deleted

## 2015-08-20 NOTE — Telephone Encounter (Signed)
left voice message regarding rescheduling appointment for 08/28/15. 

## 2015-08-28 ENCOUNTER — Ambulatory Visit (HOSPITAL_COMMUNITY): Payer: Self-pay | Admitting: Psychiatry

## 2015-09-01 ENCOUNTER — Encounter (HOSPITAL_COMMUNITY): Payer: Self-pay | Admitting: Psychiatry

## 2015-09-01 ENCOUNTER — Ambulatory Visit (INDEPENDENT_AMBULATORY_CARE_PROVIDER_SITE_OTHER): Payer: 59 | Admitting: Psychiatry

## 2015-09-01 VITALS — BP 126/70 | HR 75 | Ht 72.0 in | Wt 153.2 lb

## 2015-09-01 DIAGNOSIS — F331 Major depressive disorder, recurrent, moderate: Secondary | ICD-10-CM

## 2015-09-01 MED ORDER — HYDROXYZINE HCL 25 MG PO TABS: 25.0000 mg | ORAL_TABLET | Freq: Four times a day (QID) | ORAL | 2 refills | Status: DC | PRN

## 2015-09-01 MED ORDER — RISPERIDONE 0.5 MG PO TABS: 0.5000 mg | ORAL_TABLET | Freq: Every day | ORAL | 2 refills | Status: DC

## 2015-09-01 MED ORDER — TRAZODONE HCL 50 MG PO TABS: 50.0000 mg | ORAL_TABLET | Freq: Every evening | ORAL | 2 refills | Status: DC | PRN

## 2015-09-01 MED ORDER — DULOXETINE HCL 60 MG PO CPEP: 60.0000 mg | ORAL_CAPSULE | Freq: Every day | ORAL | 2 refills | Status: DC

## 2015-09-01 NOTE — Progress Notes (Signed)
Patient ID: DENZEL ETIENNE, male   DOB: 18-Feb-1955, 60 y.o.   MRN: [id number] Patient ID: MARSALIS BEAULIEU, male   DOB: Jul 18, 1955, 60 y.o.   MRN: [id number] Patient ID: BISHOY CUPP, male   DOB: February 16, 1955, 60 y.o.   MRN: [id number] Patient ID: MARVENS HOLLARS, male   DOB: 08-May-1955, 60 y.o.   MRN: [id number] Patient ID: ROLLINS WRIGHTSON, male   DOB: 09/01/1955, 60 y.o.   MRN: [id number] Patient ID: BRITTNEY CARAWAY, male   DOB: Apr 07, 1955, 60 y.o.   MRN: [id number] Patient ID: FINLAY GODBEE, male   DOB: 02-18-1955, 60 y.o.   MRN: [id number]  [place] [address] Progress Note   OCTAVE MONTROSE [id number] 60 y.o.  09/01/2015 2:33 PM  Chief Complaint:  I am doing better. I'm back to myself  History of Present Illness:  Letcher came for his follow-up appointment.  He was seen first time on December 17.  He was referred from inpatient psychiatric treatment.  He was discharged on multiple medication .  We recommended him to increase Effexor and stop using Ativan every day and Vistaril only if needed.  His trazodone was increased because he was complaining of poor sleep.  He is feeling much better.  Today he came with his wife.  He endorsed improvement in his sleep and depression.  He denies any crying spells or any panic attack.  He denies any active or passive suicidal thoughts or homicidal thought.  He mentioned his family issues also getting better.  He has a good support from his family member and he believed that things are going very well.  Patient denies any side effects of medication.  He denies any feeling of hopelessness or worthlessness.  He was scheduled to see Dr. Jefm Miles however he has decided not to see counselor because his symptoms are getting better.  Patient is scheduled to see Dr. Harrington Challenger on February 2.  Patient denies drinking or using any illegal substances.  He has no concern with the medication and denies any tremors or shakes.  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  His appetite is okay.  His vitals are stable.  Patient returns after 3 weeks. Last time he had been in the psychiatric hospital after suicide attempt. Unfortunately it had a gun and wanted to shoot himself. He was discharged on Risperdal and trazodone but I started him on Cymbalta since he was on no antidepressant. So far he is tolerating it and his mood is slowly getting better. His daughter and wife are with them and they state he has good and bad days but the bad days aren't nearly as bad as he had in the past. He feels somewhat dizzy from the Risperdal also I told him we could put the total dose at bedtime. He's sleeping very well but sometimes feels sluggish in the morning. He wanted to cancel his therapy with Dr. Jefm Miles and I warned him not to do this because this is an important part of his treatment plan. He denies suicidal ideation Suicidal Ideation: No Plan Formed: No Patient has means to carry out plan: No  Homicidal Ideation: No Plan Formed: No Patient has means to carry out plan: No  Past Psychiatric History/Hospitalization(s) Patient has psychiatric hospitalization in December 2015 because of severe depression and having  suicidal thoughts and plan to kill himself.  He put the gun on his head but did not pull the trigger.  He denies any other previous psychiatric inpatient treatment.  His primary care physician tried him on Wellbutrin, Lexapro and Ambien but he had a poor response.  He has taken Xanax and Valium 20 years ago when he had divorced from his first wife.  Patient denies any history of mania, psychosis, hallucination Anxiety: Yes Bipolar Disorder: No Depression: Yes Mania: No Psychosis: No Schizophrenia: No Personality Disorder: No Hospitalization for psychiatric illness: Yes History of Electroconvulsive Shock Therapy: No Prior Suicide Attempts:  No  Medical History; His primary care physician is Dr. Phillips Odor. He has high cholesterol.  ROS  Psychiatric: Agitation: No Hallucination: No Depressed Mood: No Insomnia: No Hypersomnia: No Altered Concentration: No Feels Worthless: No Grandiose Ideas: No Belief In Special Powers: No New/Increased Substance Abuse: No Compulsions: No  Neurologic: Headache: No Seizure: No Paresthesias: No   Musculoskeletal: Strength & Muscle Tone: within normal limits Gait & Station: normal Patient leans: N/A   Outpatient Encounter Prescriptions as of 09/01/2015  Medication Sig  . aspirin EC 81 MG tablet Take 1 tablet (81 mg total) by mouth daily. For heart health  . DULoxetine (CYMBALTA) 60 MG capsule Take 1 capsule (60 mg total) by mouth daily.  . hydrOXYzine (ATARAX/VISTARIL) 25 MG tablet Take 1 tablet (25 mg total) by mouth every 6 (six) hours as needed for anxiety.  . traZODone (DESYREL) 50 MG tablet Take 1 tablet (50 mg total) by mouth at bedtime as needed and may repeat dose one time if needed for sleep.  . [DISCONTINUED] DULoxetine (CYMBALTA) 60 MG capsule Take 1 capsule (60 mg total) by mouth daily.  . [DISCONTINUED] hydrOXYzine (ATARAX/VISTARIL) 25 MG tablet Take 1 tablet (25 mg total) by mouth every 6 (six) hours as needed for anxiety.  . [DISCONTINUED] risperiDONE (RISPERDAL) 0.25 MG tablet Take 1 tablet (0.25 mg total) by mouth 2 (two) times daily. For mood control  . [DISCONTINUED] traZODone (DESYREL) 50 MG tablet Take 1 tablet (50 mg total) by mouth at bedtime as needed and may repeat dose one time if needed for sleep.  . risperiDONE (RISPERDAL) 0.5 MG tablet Take 1 tablet (0.5 mg total) by mouth at bedtime.  . [DISCONTINUED] zolpidem (AMBIEN) 5 MG tablet Take 1 tablet (5 mg total) by mouth at bedtime as needed for sleep. (Patient not taking: Reported on 09/01/2015)   No facility-administered encounter medications on file as of 09/01/2015.     Recent Results (from the past 2160  hour(s))  Comprehensive metabolic panel     Status: Abnormal   Collection Time: 07/29/15  8:57 AM  Result Value Ref Range   Sodium 138 135 - 145 mmol/L   Potassium 3.9 3.5 - 5.1 mmol/L   Chloride 105 101 - 111 mmol/L   CO2 26 22 - 32 mmol/L   Glucose, Bld 127 (H) 65 - 99 mg/dL   BUN 14 6 - 20 mg/dL   Creatinine, Ser 7.65 0.61 - 1.24 mg/dL   Calcium 9.3 8.9 - 48.6 mg/dL   Total Protein 7.1 6.5 - 8.1 g/dL   Albumin 4.3 3.5 - 5.0 g/dL   AST 18 15 - 41 U/L   ALT 19 17 - 63 U/L   Alkaline Phosphatase 39 38 - 126 U/L   Total Bilirubin 0.8 0.3 - 1.2 mg/dL   GFR calc non Af Amer >60 >60 mL/min   GFR calc Af Amer >60 >60  mL/min    Comment: (NOTE) The eGFR has been calculated using the CKD EPI equation. This calculation has not been validated in all clinical situations. eGFR's persistently <60 mL/min signify possible Chronic Kidney Disease.    Anion gap 7 5 - 15  Ethanol     Status: None   Collection Time: 07/29/15  8:57 AM  Result Value Ref Range   Alcohol, Ethyl (B) <5 <5 mg/dL    Comment:        LOWEST DETECTABLE LIMIT FOR SERUM ALCOHOL IS 5 mg/dL FOR MEDICAL PURPOSES ONLY   CBC     Status: Abnormal   Collection Time: 07/29/15  8:57 AM  Result Value Ref Range   WBC 10.9 (H) 4.0 - 10.5 K/uL   RBC 4.61 4.22 - 5.81 MIL/uL   Hemoglobin 14.4 13.0 - 17.0 g/dL   HCT 82.4 23.5 - 36.1 %   MCV 92.0 78.0 - 100.0 fL   MCH 31.2 26.0 - 34.0 pg   MCHC 34.0 30.0 - 36.0 g/dL   RDW 44.3 15.4 - 00.8 %   Platelets 389 150 - 400 K/uL  Urine rapid drug screen (hosp performed)not at [facility]     Status: Abnormal   Collection Time: 07/29/15  3:00 PM  Result Value Ref Range   Opiates NONE DETECTED NONE DETECTED   Cocaine NONE DETECTED NONE DETECTED   Benzodiazepines NONE DETECTED NONE DETECTED   Amphetamines NONE DETECTED NONE DETECTED   Tetrahydrocannabinol POSITIVE (A) NONE DETECTED   Barbiturates NONE DETECTED NONE DETECTED    Comment:        DRUG SCREEN FOR MEDICAL PURPOSES ONLY.  IF  CONFIRMATION IS NEEDED FOR ANY PURPOSE, NOTIFY LAB WITHIN 5 DAYS.        LOWEST DETECTABLE LIMITS FOR URINE DRUG SCREEN Drug Class       Cutoff (ng/mL) Amphetamine      1000 Barbiturate      200 Benzodiazepine   200 Tricyclics       300 Opiates          300 Cocaine          300 THC              50       Constitutional:  BP 126/70   Pulse 75   Ht 6' (1.829 m)   Wt 153 lb 3.2 oz (69.5 kg)   BMI 20.78 kg/m    Mental Status Examination;  Patient is casually dressed and fairly groomed.  He is pleasant and cooperative.  He maintained good eye contact.  His his speech isclear and coherent.  His thought processes logical and goal-directed. He isd easy to talk with although he is hard of hearing He described his mood as better and his affect is a little brighter   He denies any auditory or visual hallucination.  He denies any active or passive suicidal thoughts or homicidal thought.  His attention and concentration is fair.  There were no delusions, paranoia or any obsessive thoughts.  His fund of knowledge is good.  His psychomotor activity is normal. There were no tremors, shakes or any EPS.  His cognition is good.  There were no flight of ideas or any loose association.  His insight judgment and impulse control is okay. His memory fund of knowledge and speech are all good   Established Problem, Stable/Improving (1), Review of Psycho-Social Stressors (1), Review and summation of old records (2), Review of Last Therapy Session (1), Review of Medication  Regimen & Side Effects (2) and Review of New Medication or Change in Dosage (2)  Assessment: Axis I: Major depressive disorder, recurrent.  Generalized anxiety disorder  Axis II: Deferred  Axis III:  Past Medical History:  Diagnosis Date  . Depression      Plan:  The patient Will continue trazodone 50 mg at bedtime and may repeat once for sleep, Vistaril will be continued at 25 mg every 8 hours as needed for anxiety. He will also  change Risperdal to put the total dose of 0.5 mg at bedtime. He will into new Cymbalta 60 mg daily and return to see me in 4 weeks. He may call at any time if his depression worsens.  He will resume his therapy with Dr. Azzie Glatter, MD 09/01/2015          Patient ID: [name], male   DOB: [birth year], 60 y.o.   MRN: [id number]

## 2015-09-09 ENCOUNTER — Ambulatory Visit (HOSPITAL_COMMUNITY): Payer: Self-pay | Admitting: Psychology

## 2015-09-24 ENCOUNTER — Ambulatory Visit (INDEPENDENT_AMBULATORY_CARE_PROVIDER_SITE_OTHER): Payer: BLUE CROSS/BLUE SHIELD | Admitting: Psychology

## 2015-09-30 ENCOUNTER — Ambulatory Visit (INDEPENDENT_AMBULATORY_CARE_PROVIDER_SITE_OTHER): Payer: BLUE CROSS/BLUE SHIELD | Admitting: Psychiatry

## 2015-09-30 ENCOUNTER — Encounter (HOSPITAL_COMMUNITY): Payer: Self-pay | Admitting: Psychiatry

## 2015-09-30 VITALS — BP 106/68 | HR 75 | Ht 72.0 in | Wt 156.6 lb

## 2015-09-30 DIAGNOSIS — F331 Major depressive disorder, recurrent, moderate: Secondary | ICD-10-CM

## 2015-09-30 MED ORDER — TRAZODONE HCL 50 MG PO TABS: 50.0000 mg | ORAL_TABLET | Freq: Every evening | ORAL | 2 refills | Status: DC | PRN

## 2015-09-30 MED ORDER — DULOXETINE HCL 60 MG PO CPEP: 60.0000 mg | ORAL_CAPSULE | Freq: Every day | ORAL | 2 refills | Status: DC

## 2015-09-30 MED ORDER — RISPERIDONE 0.5 MG PO TABS: 0.5000 mg | ORAL_TABLET | Freq: Every day | ORAL | 2 refills | Status: DC

## 2015-09-30 MED ORDER — HYDROXYZINE HCL 25 MG PO TABS: 25.0000 mg | ORAL_TABLET | Freq: Four times a day (QID) | ORAL | 2 refills | Status: DC | PRN

## 2015-09-30 NOTE — Progress Notes (Signed)
Patient ID: Mike Moran, male   DOB: 1955/10/14, 60 y.o.   MRN: [id number] Patient ID: Mike Moran, male   DOB: May 13, 1955, 60 y.o.   MRN: [id number] Patient ID: Mike Moran, male   DOB: 07-Nov-1955, 60 y.o.   MRN: [id number] Patient ID: Mike Moran, male   DOB: 05-13-1955, 60 y.o.   MRN: [id number] Patient ID: Mike Moran, male   DOB: May 28, 1955, 60 y.o.   MRN: [id number] Patient ID: Mike Moran, male   DOB: Aug 09, 1955, 60 y.o.   MRN: [id number] Patient ID: Mike Moran, male   DOB: Jun 20, 1955, 59 y.o.   MRN: [id number]  [place] [address] Progress Note   JAHQUEZ STEFFLER [id number] 60 y.o.  09/30/2015 3:56 PM  Chief Complaint:  I am doing better. I'm back to myself  History of Present Illness:  Mike Moran came for his follow-up appointment.  He was seen first time on December 17.  He was referred from inpatient psychiatric treatment.  He was discharged on multiple medication .  We recommended him to increase Effexor and stop using Ativan every day and Vistaril only if needed.  His trazodone was increased because he was complaining of poor sleep.  He is feeling much better.  Today he came with his wife.  He endorsed improvement in his sleep and depression.  He denies any crying spells or any panic attack.  He denies any active or passive suicidal thoughts or homicidal thought.  He mentioned his family issues also getting better.  He has a good support from his family member and he believed that things are going very well.  Patient denies any side effects of medication.  He denies any feeling of hopelessness or worthlessness.  He was scheduled to see Dr. Jefm Miles however he has decided not to see counselor because his symptoms are getting better.  Patient is scheduled to see Dr. Harrington Challenger on February 2.  Patient denies drinking or using any illegal substances.  He has no concern with the medication and denies any tremors or shakes.  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  His appetite is okay.  His vitals are stable.  Patient returns after 4 weeks with his wife. For the most part he is doing better. He is compliant with all of his medications. He is worried about the cost of his medicines but so far he has not even checked this out with his new insurance so he doesn't really know. He is sleeping well and staying busy doing projects around his house and helping his mother. When he feels stressed he talks to his wife doesn't hold things in. He denies suicidal ideation Suicidal Ideation: No Plan Formed: No Patient has means to carry out plan: No  Homicidal Ideation: No Plan Formed: No Patient has means to carry out plan: No  Past Psychiatric History/Hospitalization(s) Patient has psychiatric hospitalization in December 2015 because of severe depression and having suicidal thoughts and plan to kill himself.  He put the gun on his head but did not pull the trigger.  He denies any other previous psychiatric inpatient treatment.  His primary care physician tried him on Wellbutrin, Lexapro and Ambien but he had a poor response.  He has taken Xanax and Valium 20 years ago when he had divorced from his first  wife.  Patient denies any history of mania, psychosis, hallucination Anxiety: Yes Bipolar Disorder: No Depression: Yes Mania: No Psychosis: No Schizophrenia: No Personality Disorder: No Hospitalization for psychiatric illness: Yes History of Electroconvulsive Shock Therapy: No Prior Suicide Attempts: No  Medical History; His primary care physician is Mike Moran. He has high cholesterol.  ROS  Psychiatric: Agitation: No Hallucination: No Depressed Mood: No Insomnia: No Hypersomnia: No Altered Concentration: No Feels Worthless: No Grandiose Ideas: No Belief In Special Powers: No New/Increased Substance Abuse: No Compulsions:  No  Neurologic: Headache: No Seizure: No Paresthesias: No   Musculoskeletal: Strength & Muscle Tone: within normal limits Gait & Station: normal Patient leans: N/A   Outpatient Encounter Prescriptions as of 09/30/2015  Medication Sig  . aspirin EC 81 MG tablet Take 1 tablet (81 mg total) by mouth daily. For heart health  . DULoxetine (CYMBALTA) 60 MG capsule Take 1 capsule (60 mg total) by mouth daily.  . hydrOXYzine (ATARAX/VISTARIL) 25 MG tablet Take 1 tablet (25 mg total) by mouth every 6 (six) hours as needed for anxiety.  . risperiDONE (RISPERDAL) 0.5 MG tablet Take 1 tablet (0.5 mg total) by mouth at bedtime.  . traZODone (DESYREL) 50 MG tablet Take 1 tablet (50 mg total) by mouth at bedtime as needed and may repeat dose one time if needed for sleep.  . [DISCONTINUED] DULoxetine (CYMBALTA) 60 MG capsule Take 1 capsule (60 mg total) by mouth daily.  . [DISCONTINUED] hydrOXYzine (ATARAX/VISTARIL) 25 MG tablet Take 1 tablet (25 mg total) by mouth every 6 (six) hours as needed for anxiety.  . [DISCONTINUED] risperiDONE (RISPERDAL) 0.5 MG tablet Take 1 tablet (0.5 mg total) by mouth at bedtime.  . [DISCONTINUED] traZODone (DESYREL) 50 MG tablet Take 1 tablet (50 mg total) by mouth at bedtime as needed and may repeat dose one time if needed for sleep.   No facility-administered encounter medications on file as of 09/30/2015.     Recent Results (from the past 2160 hour(s))  Comprehensive metabolic panel     Status: Abnormal   Collection Time: 07/29/15  8:57 AM  Result Value Ref Range   Sodium 138 135 - 145 mmol/L   Potassium 3.9 3.5 - 5.1 mmol/L   Chloride 105 101 - 111 mmol/L   CO2 26 22 - 32 mmol/L   Glucose, Bld 127 (H) 65 - 99 mg/dL   BUN 14 6 - 20 mg/dL   Creatinine, Ser 0.77 0.61 - 1.24 mg/dL   Calcium 9.3 8.9 - 10.3 mg/dL   Total Protein 7.1 6.5 - 8.1 g/dL   Albumin 4.3 3.5 - 5.0 g/dL   AST 18 15 - 41 U/L   ALT 19 17 - 63 U/L   Alkaline Phosphatase 39 38 - 126 U/L    Total Bilirubin 0.8 0.3 - 1.2 mg/dL   GFR calc non Af Amer >60 >60 mL/min   GFR calc Af Amer >60 >60 mL/min    Comment: (NOTE) The eGFR has been calculated using the CKD EPI equation. This calculation has not been validated in all clinical situations. eGFR's persistently <60 mL/min signify possible Chronic Kidney Disease.    Anion gap 7 5 - 15  Ethanol     Status: None   Collection Time: 07/29/15  8:57 AM  Result Value Ref Range   Alcohol, Ethyl (B) <5 <5 mg/dL    Comment:        LOWEST DETECTABLE LIMIT FOR SERUM ALCOHOL IS 5 mg/dL FOR MEDICAL PURPOSES ONLY  CBC     Status: Abnormal   Collection Time: 07/29/15  8:57 AM  Result Value Ref Range   WBC 10.9 (H) 4.0 - 10.5 K/uL   RBC 4.61 4.22 - 5.81 MIL/uL   Hemoglobin 14.4 13.0 - 17.0 g/dL   HCT 71.0 97.8 - 02.2 %   MCV 92.0 78.0 - 100.0 fL   MCH 31.2 26.0 - 34.0 pg   MCHC 34.0 30.0 - 36.0 g/dL   RDW 19.4 80.8 - 23.9 %   Platelets 389 150 - 400 K/uL  Urine rapid drug screen (hosp performed)not at [facility]     Status: Abnormal   Collection Time: 07/29/15  3:00 PM  Result Value Ref Range   Opiates NONE DETECTED NONE DETECTED   Cocaine NONE DETECTED NONE DETECTED   Benzodiazepines NONE DETECTED NONE DETECTED   Amphetamines NONE DETECTED NONE DETECTED   Tetrahydrocannabinol POSITIVE (A) NONE DETECTED   Barbiturates NONE DETECTED NONE DETECTED    Comment:        DRUG SCREEN FOR MEDICAL PURPOSES ONLY.  IF CONFIRMATION IS NEEDED FOR ANY PURPOSE, NOTIFY LAB WITHIN 5 DAYS.        LOWEST DETECTABLE LIMITS FOR URINE DRUG SCREEN Drug Class       Cutoff (ng/mL) Amphetamine      1000 Barbiturate      200 Benzodiazepine   200 Tricyclics       300 Opiates          300 Cocaine          300 THC              50       Constitutional:  BP 106/68 (BP Location: Right Arm, Patient Position: Sitting)   Pulse 75   Ht 6' (1.829 m)   Wt 156 lb 9.6 oz (71 kg)   SpO2 90%   BMI 21.24 kg/m    Mental Status Examination;  Patient is  casually dressed and fairly groomed.  He is pleasant and cooperative.  He maintained good eye contact.  His his speech isclear and coherent.  His thought processes logical and goal-directed. He isd easy to talk with although he is hard of hearing He described his mood as better and his affect is a l brighter   He denies any auditory or visual hallucination.  He denies any active or passive suicidal thoughts or homicidal thought.  His attention and concentration is fair.  There were no delusions, paranoia or any obsessive thoughts.  His fund of knowledge is good.  His psychomotor activity is normal. There were no tremors, shakes or any EPS.  His cognition is good.  There were no flight of ideas or any loose association.  His insight judgment and impulse control is okay. His memory fund of knowledge and speech are all good   Established Problem, Stable/Improving (1), Review of Psycho-Social Stressors (1), Review and summation of old records (2), Review of Last Therapy Session (1), Review of Medication Regimen & Side Effects (2) and Review of New Medication or Change in Dosage (2)  Assessment: Axis I: Major depressive disorder, recurrent.  Generalized anxiety disorder  Axis II: Deferred  Axis III:  Past Medical History:  Diagnosis Date  . Depression      Plan:  The patient Will continue trazodone 50 mg at bedtime and may repeat once for sleep, Vistaril will be continued at 25 mg every 8 hours as needed for anxiety. He will Continue Risperdal  0.5 mg at  bedtime. He will continue Cymbalta 60 mg daily and return to see me in 2 months that his requests. He may call at any time if his depression worsens.  He will resume his therapy with Dr. Azzie Glatter, MD 09/30/2015          Patient ID: [name], male   DOB: 1955/09/02, 60 y.o.   MRN: [id number] Patient ID: MASAJI BILLUPS, male   DOB: Dec 14, 1955, 60 y.o.   MRN: [id number]

## 2015-10-23 ENCOUNTER — Ambulatory Visit (INDEPENDENT_AMBULATORY_CARE_PROVIDER_SITE_OTHER): Payer: BLUE CROSS/BLUE SHIELD | Admitting: Psychology

## 2015-11-19 ENCOUNTER — Ambulatory Visit (INDEPENDENT_AMBULATORY_CARE_PROVIDER_SITE_OTHER): Payer: BLUE CROSS/BLUE SHIELD | Admitting: Psychiatry

## 2015-11-19 ENCOUNTER — Encounter (HOSPITAL_COMMUNITY): Payer: Self-pay | Admitting: Psychiatry

## 2015-11-19 VITALS — BP 110/62 | HR 93 | Ht 72.0 in | Wt 157.8 lb

## 2015-11-19 DIAGNOSIS — F411 Generalized anxiety disorder: Secondary | ICD-10-CM | POA: Diagnosis not present

## 2015-11-19 DIAGNOSIS — F331 Major depressive disorder, recurrent, moderate: Secondary | ICD-10-CM | POA: Diagnosis not present

## 2015-11-19 MED ORDER — TRAZODONE HCL 50 MG PO TABS: 50.0000 mg | ORAL_TABLET | Freq: Every evening | ORAL | 2 refills | Status: DC | PRN

## 2015-11-19 MED ORDER — RISPERIDONE 0.5 MG PO TABS: 0.5000 mg | ORAL_TABLET | Freq: Every day | ORAL | 2 refills | Status: DC

## 2015-11-19 MED ORDER — DULOXETINE HCL 60 MG PO CPEP: 60.0000 mg | ORAL_CAPSULE | Freq: Every day | ORAL | 2 refills | Status: DC

## 2015-11-19 MED ORDER — HYDROXYZINE HCL 25 MG PO TABS: 25.0000 mg | ORAL_TABLET | Freq: Four times a day (QID) | ORAL | 2 refills | Status: DC | PRN

## 2015-11-19 NOTE — Progress Notes (Signed)
Patient ID: CHRIS CRIPPS, male   DOB: 1955/07/04, 60 y.o.   MRN: [id number] Patient ID: MARTAVIOUS HARTEL, male   DOB: 1955-12-22, 60 y.o.   MRN: [id number] Patient ID: LAVONTAE CORNIA, male   DOB: 02-Mar-1955, 60 y.o.   MRN: [id number] Patient ID: DAYRON ODLAND, male   DOB: 03-01-1955, 60 y.o.   MRN: [id number] Patient ID: KHALEEM BURCHILL, male   DOB: 1955/02/27, 60 y.o.   MRN: [id number] Patient ID: LOGIN MUCKLEROY, male   DOB: March 23, 1955, 60 y.o.   MRN: [id number] Patient ID: ISSACHAR BROADY, male   DOB: 07-Jul-1955, 60 y.o.   MRN: [id number]  [facility] [id number] Progress Note   Mike Moran [id number] 60 y.o.  11/19/2015 3:53 PM  Chief Complaint:  I am doing better. I'm back to myself  History of Present Illness:  Mike Moran came for his follow-up appointment.  He was seen first time on December 17.  He was referred from inpatient psychiatric treatment.  He was discharged on multiple medication .  We recommended him to increase Effexor and stop using Ativan every day and Vistaril only if needed.  His trazodone was increased because he was complaining of poor sleep.  He is feeling much better.  Today he came with his wife.  He endorsed improvement in his sleep and depression.  He denies any crying spells or any panic attack.  He denies any active or passive suicidal thoughts or homicidal thought.  He mentioned his family issues also getting better.  He has a good support from his family member and he believed that things are going very well.  Patient denies any side effects of medication.  He denies any feeling of hopelessness or worthlessness.  He was scheduled to see Dr. Shelva Majestic however he has decided not to see counselor because his symptoms are getting better.  Patient is scheduled to see Dr. Tenny Craw on February 2.  Patient denies drinking or using any illegal substances.  He has no concern with the medication and denies any tremors or shakes.  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  His appetite is okay.  His vitals are stable.  Patient returns after 6 weeks with his wife. For the most part he is doing better. He feels more relaxed. At times he is not sure what to do it himself now that he is retired. He has planned a fishing trip with a friend to [employer]. He is doing some work around the house. He is eating better and his energy is pretty good. He denies any thoughts of self-harm. He is sleeping well Suicidal Ideation: No Plan Formed: No Patient has means to carry out plan: No  Homicidal Ideation: No Plan Formed: No Patient has means to carry out plan: No  Past Psychiatric History/Hospitalization(s) Patient has psychiatric hospitalization in December 2015 because of severe depression and having suicidal thoughts and plan to kill himself.  He put the gun on his head but did not pull the trigger.  He denies any other previous psychiatric inpatient treatment.  His primary care physician tried him on Wellbutrin, Lexapro and Ambien but he had a poor response.  He has taken Xanax and Valium 20 years ago when he had divorced from his first wife.  Patient denies any history of mania, psychosis, hallucination  Anxiety: Yes Bipolar Disorder: No Depression: Yes Mania: No Psychosis: No Schizophrenia: No Personality Disorder: No Hospitalization for psychiatric illness: Yes History of Electroconvulsive Shock Therapy: No Prior Suicide Attempts: No  Medical History; His primary care physician is Dr. Phillips Odor. He has high cholesterol.  ROS  Psychiatric: Agitation: No Hallucination: No Depressed Mood: No Insomnia: No Hypersomnia: No Altered Concentration: No Feels Worthless: No Grandiose Ideas: No Belief In Special Powers: No New/Increased Substance Abuse: No Compulsions: No  Neurologic: Headache: No Seizure: No Paresthesias:  No   Musculoskeletal: Strength & Muscle Tone: within normal limits Gait & Station: normal Patient leans: N/A   Outpatient Encounter Prescriptions as of 11/19/2015  Medication Sig  . aspirin EC 81 MG tablet Take 1 tablet (81 mg total) by mouth daily. For heart health  . DULoxetine (CYMBALTA) 60 MG capsule Take 1 capsule (60 mg total) by mouth daily.  . hydrOXYzine (ATARAX/VISTARIL) 25 MG tablet Take 1 tablet (25 mg total) by mouth every 6 (six) hours as needed for anxiety.  . risperiDONE (RISPERDAL) 0.5 MG tablet Take 1 tablet (0.5 mg total) by mouth at bedtime.  . traZODone (DESYREL) 50 MG tablet Take 1 tablet (50 mg total) by mouth at bedtime as needed and may repeat dose one time if needed for sleep.  . [DISCONTINUED] DULoxetine (CYMBALTA) 60 MG capsule Take 1 capsule (60 mg total) by mouth daily.  . [DISCONTINUED] hydrOXYzine (ATARAX/VISTARIL) 25 MG tablet Take 1 tablet (25 mg total) by mouth every 6 (six) hours as needed for anxiety.  . [DISCONTINUED] risperiDONE (RISPERDAL) 0.5 MG tablet Take 1 tablet (0.5 mg total) by mouth at bedtime.  . [DISCONTINUED] traZODone (DESYREL) 50 MG tablet Take 1 tablet (50 mg total) by mouth at bedtime as needed and may repeat dose one time if needed for sleep.   No facility-administered encounter medications on file as of 11/19/2015.     No results found for this or any previous visit (from the past 2160 hour(s)).    Constitutional:  BP 110/62 (BP Location: Right Arm, Patient Position: Sitting, Cuff Size: Normal)   Pulse 93   Ht 6' (1.829 m)   Wt 157 lb 12.8 oz (71.6 kg)   BMI 21.40 kg/m    Mental Status Examination;  Patient is casually dressed and fairly groomed.  He is pleasant and cooperative.  He maintained good eye contact.  His his speech isclear and coherent.  His thought processes logical and goal-directed. He isd easy to talk with although he is hard of hearing He described his mood as better and his affect is a l brighter   He  denies any auditory or visual hallucination.  He denies any active or passive suicidal thoughts or homicidal thought.  His attention and concentration is fair.  There were no delusions, paranoia or any obsessive thoughts.  His fund of knowledge is good.  His psychomotor activity is normal. There were no tremors, shakes or any EPS.  His cognition is good.  There were no flight of ideas or any loose association.  His insight judgment and impulse control is okay. His memory fund of knowledge and speech are all good   Established Problem, Stable/Improving (1), Review of Psycho-Social Stressors (1), Review and summation of old records (2), Review of Last Therapy Session (1), Review of Medication Regimen & Side Effects (2) and Review of New Medication or Change in Dosage (2)  Assessment: Axis I: Major depressive disorder, recurrent.  Generalized anxiety disorder  Axis II: Deferred  Axis  III:  Past Medical History:  Diagnosis Date  . Depression      Plan:  The patient Will continue trazodone 50 mg at bedtime and may repeat once for sleep, Vistaril will be continued at 25 mg every 8 hours as needed for anxiety. He will Continue Risperdal  0.5 mg at bedtime. He will continue Cymbalta 60 mg daily and return to see me in 2 months . He may call at any time if his depression worsens.  He will Continue his therapy with Dr. Ellis Savageodenbaugh  Ketra Duchesne, MD 11/19/2015          Patient ID: Mike Moran, male   DOB: 07-02-1955, 60 y.o.   MRN: [id number] Patient ID: Mike Moran, male   DOB: 07-02-1955, 60 y.o.   MRN: [id number]

## 2015-11-27 ENCOUNTER — Ambulatory Visit (HOSPITAL_COMMUNITY): Payer: Self-pay | Admitting: Psychiatry

## 2016-01-19 ENCOUNTER — Encounter (HOSPITAL_COMMUNITY): Payer: Self-pay | Admitting: Psychiatry

## 2016-01-19 ENCOUNTER — Ambulatory Visit (INDEPENDENT_AMBULATORY_CARE_PROVIDER_SITE_OTHER): Payer: BLUE CROSS/BLUE SHIELD | Admitting: Psychiatry

## 2016-01-19 VITALS — BP 108/72 | HR 78 | Ht 72.0 in | Wt 162.8 lb

## 2016-01-19 DIAGNOSIS — F331 Major depressive disorder, recurrent, moderate: Secondary | ICD-10-CM | POA: Diagnosis not present

## 2016-01-19 DIAGNOSIS — F411 Generalized anxiety disorder: Secondary | ICD-10-CM

## 2016-01-19 MED ORDER — HYDROXYZINE HCL 25 MG PO TABS: 25.0000 mg | ORAL_TABLET | Freq: Four times a day (QID) | ORAL | 2 refills | Status: DC | PRN

## 2016-01-19 MED ORDER — RISPERIDONE 0.5 MG PO TABS: 0.5000 mg | ORAL_TABLET | Freq: Every day | ORAL | 2 refills | Status: DC

## 2016-01-19 MED ORDER — DULOXETINE HCL 60 MG PO CPEP: 60.0000 mg | ORAL_CAPSULE | Freq: Every day | ORAL | 2 refills | Status: DC

## 2016-01-19 MED ORDER — TRAZODONE HCL 50 MG PO TABS: 50.0000 mg | ORAL_TABLET | Freq: Every evening | ORAL | 2 refills | Status: DC | PRN

## 2016-01-19 NOTE — Progress Notes (Signed)
Patient ID: Mike Moran Lesmeister, male   DOB: December 06, 1955, 60 y.o.   MRN: [id number] Patient ID: Mike Moran Muckey, male   DOB: December 06, 1955, 60 y.o.   MRN: [id number] Patient ID: Mike Moran Netherton, male   DOB: December 06, 1955, 60 y.o.   MRN: [id number] Patient ID: Mike Moran Buchta, male   DOB: December 06, 1955, 60 y.o.   MRN: [id number] Patient ID: Mike Moran Fulbright, male   DOB: December 06, 1955, 60 y.o.   MRN: [id number] Patient ID: Mike Moran Schillaci, male   DOB: December 06, 1955, 60 y.o.   MRN: [id number] Patient ID: Mike Moran Froh, male   DOB: December 06, 1955, 60 y.o.   MRN: [id number]  [facility] [id number] Progress Note   Mike Moran Parran [id number] 60 y.o.  01/19/2016 2:39 PM  Chief Complaint:  I am doing better. I'm back to myself  History of Present Illness:  Mike Moran came for his follow-up appointment.  He was seen first time on December 17.  He was referred from inpatient psychiatric treatment.  He was discharged on multiple medication .  We recommended him to increase Effexor and stop using Ativan every day and Vistaril only if needed.  His trazodone was increased because he was complaining of poor sleep.  He is feeling much better.  Today he came with his wife.  He endorsed improvement in his sleep and depression.  He denies any crying spells or any panic attack.  He denies any active or passive suicidal thoughts or homicidal thought.  He mentioned his family issues also getting better.  He has a good support from his family member and he believed that things are going very well.  Patient denies any side effects of medication.  He denies any feeling of hopelessness or worthlessness.  He was scheduled to see Dr. Shelva Majesticodenbaugh however he has decided not to see counselor because his symptoms are getting better.  Patient is scheduled to see Dr. Tenny Crawoss on February 2.  Patient denies drinking or using any illegal substances.  He has no concern with the medication and denies any tremors or shakes.  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  His appetite is okay.  His vitals are stable.  Patient returns after 3 months with his wife. He continues to do well. He is sleeping and eating well. He spending a lot of time with his mother and doing things around the house. He denies feeling depressed sad or hopeless. He denies any thoughts of suicide. The trazodone is helping him sleep. He is been retired for 6 months and still is adjusting but overall is doing quite well Suicidal Ideation: No Plan Formed: No Patient has means to carry out plan: No  Homicidal Ideation: No Plan Formed: No Patient has means to carry out plan: No  Past Psychiatric History/Hospitalization(s) Patient has psychiatric hospitalization in December 2015 because of severe depression and having suicidal thoughts and plan to kill himself.  He put the gun on his head but did not pull the trigger.  He denies any other previous psychiatric inpatient treatment.  His primary care physician tried him on Wellbutrin, Lexapro and Ambien but he had a poor response.  He has taken Xanax and Valium 20 years ago when he had divorced from his first wife.  Patient denies any history of mania, psychosis, hallucination Anxiety: Yes Bipolar Disorder: No  Depression: Yes Mania: No Psychosis: No Schizophrenia: No Personality Disorder: No Hospitalization for psychiatric illness: Yes History of Electroconvulsive Shock Therapy: No Prior Suicide Attempts: yes  Medical History; His primary care physician is Dr. Phillips Odor. He has high cholesterol.  ROS  Psychiatric: Agitation: No Hallucination: No Depressed Mood: No Insomnia: No Hypersomnia: No Altered Concentration: No Feels Worthless: No Grandiose Ideas: No Belief In Special Powers: No New/Increased Substance Abuse: No Compulsions: No  Neurologic: Headache: No Seizure: No Paresthesias:  No   Musculoskeletal: Strength & Muscle Tone: within normal limits Gait & Station: normal Patient leans: N/A   Outpatient Encounter Prescriptions as of 01/19/2016  Medication Sig  . aspirin EC 81 MG tablet Take 1 tablet (81 mg total) by mouth daily. For heart health  . DULoxetine (CYMBALTA) 60 MG capsule Take 1 capsule (60 mg total) by mouth daily.  . hydrOXYzine (ATARAX/VISTARIL) 25 MG tablet Take 1 tablet (25 mg total) by mouth every 6 (six) hours as needed for anxiety.  . risperiDONE (RISPERDAL) 0.5 MG tablet Take 1 tablet (0.5 mg total) by mouth at bedtime.  . traZODone (DESYREL) 50 MG tablet Take 1 tablet (50 mg total) by mouth at bedtime as needed and may repeat dose one time if needed for sleep.  . [DISCONTINUED] DULoxetine (CYMBALTA) 60 MG capsule Take 1 capsule (60 mg total) by mouth daily.  . [DISCONTINUED] hydrOXYzine (ATARAX/VISTARIL) 25 MG tablet Take 1 tablet (25 mg total) by mouth every 6 (six) hours as needed for anxiety.  . [DISCONTINUED] risperiDONE (RISPERDAL) 0.5 MG tablet Take 1 tablet (0.5 mg total) by mouth at bedtime.  . [DISCONTINUED] traZODone (DESYREL) 50 MG tablet Take 1 tablet (50 mg total) by mouth at bedtime as needed and may repeat dose one time if needed for sleep.   No facility-administered encounter medications on file as of 01/19/2016.     No results found for this or any previous visit (from the past 2160 hour(s)).    Constitutional:  BP 108/72 (BP Location: Right Arm, Patient Position: Sitting, Cuff Size: Normal)   Pulse 78   Ht 6' (1.829 m)   Wt 162 lb 12.8 oz (73.8 kg)   BMI 22.08 kg/m    Mental Status Examination;  Patient is casually dressed and fairly groomed.  He is pleasant and cooperative.  He maintained good eye contact.  His his speech isclear and coherent.  His thought processes logical and goal-directed. He isd easy to talk with although he is hard of hearing He described his mood as Good and his affect is bright  He denies any  auditory or visual hallucination.  He denies any active or passive suicidal thoughts or homicidal thought.  His attention and concentration is fair.  There were no delusions, paranoia or any obsessive thoughts.  His fund of knowledge is good.  His psychomotor activity is normal. There were no tremors, shakes or any EPS.  His cognition is good.  There were no flight of ideas or any loose association.  His insight judgment and impulse control is okay. His memory fund of knowledge and speech are all good   Established Problem, Stable/Improving (1), Review of Psycho-Social Stressors (1), Review and summation of old records (2), Review of Last Therapy Session (1), Review of Medication Regimen & Side Effects (2) and Review of New Medication or Change in Dosage (2)  Assessment: Axis I: Major depressive disorder, recurrent.  Generalized anxiety disorder  Axis II: Deferred  Axis III:  Past Medical History:  Diagnosis Date  .  Depression      Plan:  The patient Will continue trazodone 50 mg at bedtime and may repeat once for sleep, Vistaril will be continued at 25 mg every 8 hours as needed for anxiety. He will Continue Risperdal  0.5 mg at bedtime. He will continue Cymbalta 60 mg daily and return to see me in 3 months . He may call at any time if his depression worsens.  He will Continue his therapy with Dr. Ellis Savageodenbaugh  ROSS, DEBORAH, MD 01/19/2016          Patient ID: Mike Moran Lovern, male   DOB: 1955-12-06, 60 y.o.   MRN: [id number] Patient ID: Mike Moran Jourdan, male   DOB: 1955-12-06, 60 y.o.   MRN: [id number]

## 2016-04-13 ENCOUNTER — Ambulatory Visit: Payer: Self-pay | Admitting: General Surgery

## 2016-04-15 ENCOUNTER — Ambulatory Visit (HOSPITAL_COMMUNITY): Payer: Self-pay | Admitting: Psychiatry

## 2016-11-12 ENCOUNTER — Other Ambulatory Visit (HOSPITAL_COMMUNITY): Payer: Self-pay | Admitting: Psychiatry

## 2016-11-25 ENCOUNTER — Encounter (HOSPITAL_COMMUNITY): Payer: Self-pay | Admitting: Psychiatry

## 2016-11-25 ENCOUNTER — Ambulatory Visit (INDEPENDENT_AMBULATORY_CARE_PROVIDER_SITE_OTHER): Payer: BLUE CROSS/BLUE SHIELD | Admitting: Psychiatry

## 2016-11-25 VITALS — BP 139/82 | Ht 72.0 in | Wt 168.0 lb

## 2016-11-25 DIAGNOSIS — F339 Major depressive disorder, recurrent, unspecified: Secondary | ICD-10-CM | POA: Diagnosis not present

## 2016-11-25 DIAGNOSIS — F411 Generalized anxiety disorder: Secondary | ICD-10-CM

## 2016-11-25 DIAGNOSIS — Z79899 Other long term (current) drug therapy: Secondary | ICD-10-CM

## 2016-11-25 DIAGNOSIS — F331 Major depressive disorder, recurrent, moderate: Secondary | ICD-10-CM

## 2016-11-25 MED ORDER — HYDROXYZINE HCL 25 MG PO TABS
25.0000 mg | ORAL_TABLET | Freq: Four times a day (QID) | ORAL | 2 refills | Status: DC | PRN
Start: 2016-11-25 — End: 2017-05-24

## 2016-11-25 MED ORDER — RISPERIDONE 0.5 MG PO TABS: 0.5000 mg | ORAL_TABLET | Freq: Every day | ORAL | 2 refills | Status: DC

## 2016-11-25 MED ORDER — TRAZODONE HCL 50 MG PO TABS: 50.0000 mg | ORAL_TABLET | Freq: Every evening | ORAL | 2 refills | Status: DC | PRN

## 2016-11-25 MED ORDER — DULOXETINE HCL 60 MG PO CPEP: 60.0000 mg | ORAL_CAPSULE | Freq: Every day | ORAL | 2 refills | Status: DC
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
## 2017-05-24 ENCOUNTER — Ambulatory Visit (HOSPITAL_COMMUNITY): Payer: BLUE CROSS/BLUE SHIELD | Admitting: Psychiatry

## 2017-05-24 ENCOUNTER — Encounter (HOSPITAL_COMMUNITY): Payer: Self-pay | Admitting: Psychiatry

## 2017-05-24 VITALS — BP 106/69 | HR 109 | Ht 72.0 in | Wt 161.0 lb

## 2017-05-24 DIAGNOSIS — Z87891 Personal history of nicotine dependence: Secondary | ICD-10-CM

## 2017-05-24 DIAGNOSIS — F331 Major depressive disorder, recurrent, moderate: Secondary | ICD-10-CM | POA: Diagnosis not present

## 2017-05-24 DIAGNOSIS — F419 Anxiety disorder, unspecified: Secondary | ICD-10-CM | POA: Diagnosis not present

## 2017-05-24 MED ORDER — DULOXETINE HCL 60 MG PO CPEP
60.0000 mg | ORAL_CAPSULE | Freq: Every day | ORAL | 2 refills | Status: DC
Start: 2017-05-24 — End: 2017-11-23

## 2017-05-24 MED ORDER — HYDROXYZINE HCL 25 MG PO TABS: 25.0000 mg | ORAL_TABLET | Freq: Four times a day (QID) | ORAL | 2 refills | Status: DC | PRN

## 2017-05-24 MED ORDER — TRAZODONE HCL 50 MG PO TABS: 50.0000 mg | ORAL_TABLET | Freq: Every evening | ORAL | 2 refills | Status: DC | PRN

## 2017-05-24 MED ORDER — RISPERIDONE 0.5 MG PO TABS: 0.5000 mg | ORAL_TABLET | Freq: Every day | ORAL | 2 refills | Status: DC

## 2017-05-24 NOTE — Progress Notes (Signed)
BH MD/PA/NP OP Progress Note  05/24/2017 4:28 PM Mike Moran  MRN:  [id number]  Chief Complaint:  Chief Complaint    Depression; Anxiety; Follow-up     HPI: This patient is a 62 year old white male who lives with his wife.  He is a retired Engineer, siteHVAC technician.  The patient returns for follow-up regarding depression and anxiety.  He was hospitalized in 2017 but has done well ever since.  He states that his mood has been good since his last checkup 6 months ago.  He is staying active helping out his mother and his wife's mother.  He has been enjoying fishing with his wife and other family members.  His energy is good he is sleeping well and eating well.  He denies any thoughts of suicide and denies depressed mood or anxiety. Visit Diagnosis:    ICD-10-CM   1. Major depressive disorder, recurrent episode, moderate (HCC) F33.1     Past Psychiatric History: One psychiatric admission in 2017 for suicide attempt  Past Medical History:  Past Medical History:  Diagnosis Date  . Depression    History reviewed. No pertinent surgical history.  Family Psychiatric History: None  Family History: History reviewed. No pertinent family history.  Social History:  Social History   Socioeconomic History  . Marital status: Married    Spouse name: Not on file  . Number of children: Not on file  . Years of education: Not on file  . Highest education level: Not on file  Occupational History  . Not on file  Social Needs  . Financial resource strain: Not on file  . Food insecurity:    Worry: Not on file    Inability: Not on file  . Transportation needs:    Medical: Not on file    Non-medical: Not on file  Tobacco Use  . Smoking status: Former Games developermoker  . Smokeless tobacco: Never Used  Substance and Sexual Activity  . Alcohol use: Yes    Alcohol/week: 0.0 oz    Comment: occ- pt denies SA use  . Drug use: No  . Sexual activity: Not on file  Lifestyle  . Physical activity:    Days per week:  Not on file    Minutes per session: Not on file  . Stress: Not on file  Relationships  . Social connections:    Talks on phone: Not on file    Gets together: Not on file    Attends religious service: Not on file    Active member of club or organization: Not on file    Attends meetings of clubs or organizations: Not on file    Relationship status: Not on file  Other Topics Concern  . Not on file  Social History Narrative  . Not on file    Allergies:  Allergies  Allergen Reactions  . Codeine Nausea And Vomiting  . Lexapro [Escitalopram Oxalate] Other (See Comments)    lethargic    Metabolic Disorder Labs: Lab Results  Component Value Date   HGBA1C 5.6 01/10/2014   MPG 114 01/10/2014   No results found for: PROLACTIN Lab Results  Component Value Date   CHOL 237 (H) 01/10/2014   TRIG 102 01/10/2014   HDL 57 01/10/2014   CHOLHDL 4.2 01/10/2014   VLDL 20 01/10/2014   LDLCALC 160 (H) 01/10/2014   Lab Results  Component Value Date   TSH 2.090 01/10/2014    Therapeutic Level Labs: No results found for: LITHIUM No results found for: VALPROATE  No components found for:  CBMZ  Current Medications: Current Outpatient Medications  Medication Sig Dispense Refill  . aspirin EC 81 MG tablet Take 1 tablet (81 mg total) by mouth daily. For heart health    . DULoxetine (CYMBALTA) 60 MG capsule Take 1 capsule (60 mg total) by mouth daily. 90 capsule 2  . hydrOXYzine (ATARAX/VISTARIL) 25 MG tablet Take 1 tablet (25 mg total) by mouth every 6 (six) hours as needed for anxiety. 180 tablet 2  . risperiDONE (RISPERDAL) 0.5 MG tablet Take 1 tablet (0.5 mg total) by mouth at bedtime. 90 tablet 2  . traZODone (DESYREL) 50 MG tablet Take 1 tablet (50 mg total) by mouth at bedtime as needed and may repeat dose one time if needed for sleep. 180 tablet 2   No current facility-administered medications for this visit.      Musculoskeletal: Strength & Muscle Tone: within normal  limits Gait & Station: normal Patient leans: N/A  Psychiatric Specialty Exam: Review of Systems  All other systems reviewed and are negative.   Blood pressure 106/69, pulse (!) 109, height 6' (1.829 m), weight 161 lb (73 kg), SpO2 95 %.Body mass index is 21.84 kg/m.  General Appearance: Casual, Neat and Well Groomed  Eye Contact:  Good  Speech:  Clear and Coherent  Volume:  Normal  Mood:  Euthymic  Affect:  Congruent  Thought Process:  Goal Directed  Orientation:  Full (Time, Place, and Person)  Thought Content: WDL   Suicidal Thoughts:  No  Homicidal Thoughts:  No  Memory:  Immediate;   Good Recent;   Good Remote;   Good  Judgement:  Good  Insight:  Fair  Psychomotor Activity:  Normal  Concentration:  Concentration: Good and Attention Span: Good  Recall:  Good  Fund of Knowledge: Good  Language: Good  Akathisia:  No  Handed:  Right  AIMS (if indicated): not done  Assets:  Communication Skills Desire for Improvement Physical Health Resilience Social Support Talents/Skills  ADL's:  Intact  Cognition: WNL  Sleep:  Good   Screenings: AIMS     [facility]  AIMS Total Score  0    AUDIT     [facility]) [facility] Admission ([facility]  Alcohol Use Disorder Identification Test Final Score (AUDIT)  0  3       Assessment and Plan: This patient is a 62 year old male with a history of severe depression and anxiety.  He was hospitalized in 2017 for suicide attempt but has done quite well ever since.  He will continue Cymbalta 60 mg daily for depression, hydroxyzine 25 mg every 6 hours as needed for anxiety, Risperdal 0.5 mg at bedtime for mood stabilization and trazodone 50-100 mg at bedtime for sleep.  He will return to see me in 6 months or call sooner if  needed   Diannia Ruder, MD 05/24/2017, 4:28 PM

## 2017-11-23 ENCOUNTER — Encounter (HOSPITAL_COMMUNITY): Payer: Self-pay | Admitting: Psychiatry

## 2017-11-23 ENCOUNTER — Ambulatory Visit (HOSPITAL_COMMUNITY): Payer: BLUE CROSS/BLUE SHIELD | Admitting: Psychiatry

## 2017-11-23 VITALS — BP 134/88 | HR 109 | Ht 72.0 in | Wt 167.0 lb

## 2017-11-23 DIAGNOSIS — F331 Major depressive disorder, recurrent, moderate: Secondary | ICD-10-CM

## 2017-11-23 MED ORDER — DULOXETINE HCL 60 MG PO CPEP: 60.0000 mg | ORAL_CAPSULE | Freq: Every day | ORAL | 2 refills | Status: DC

## 2017-11-23 MED ORDER — HYDROXYZINE HCL 25 MG PO TABS: 25.0000 mg | ORAL_TABLET | Freq: Four times a day (QID) | ORAL | 2 refills | Status: DC | PRN

## 2017-11-23 MED ORDER — RISPERIDONE 0.5 MG PO TABS: 0.5000 mg | ORAL_TABLET | Freq: Every day | ORAL | 2 refills | Status: DC

## 2017-11-23 MED ORDER — TRAZODONE HCL 50 MG PO TABS: 50.0000 mg | ORAL_TABLET | Freq: Every evening | ORAL | 2 refills | Status: DC | PRN

## 2017-11-23 NOTE — Progress Notes (Signed)
[facility] MD/PA/NP OP Progress Note  11/23/2017 4:05 PM [facility] ORSAK  MRN:  [id number]  Chief Complaint:  Chief Complaint    Depression; Anxiety; Follow-up     HPI: This patient is a 62 year old white male who lives with his wife.  He has had retired Engineer, site.  The patient returns for follow-up after 6 months regarding depression and anxiety.  He was hospitalized after suicide attempt in 2017 but has done well ever since.  He states that his mood continues to be good.  He has been doing a lot of kayaking fishing and other outdoor activities.  He is sleeping and eating well.  His energy is good.  He denies depression and suicidal ideation or anxiety. Visit Diagnosis:    ICD-10-CM   1. Major depressive disorder, recurrent episode, moderate (HCC) F33.1     Past Psychiatric History: One psychiatric admission in 2017 for suicide attempt  Past Medical History:  Past Medical History:  Diagnosis Date  . Depression    History reviewed. No pertinent surgical history.  Family Psychiatric History: none  Family History: History reviewed. No pertinent family history.  Social History:  Social History   Socioeconomic History  . Marital status: Married    Spouse name: Not on file  . Number of children: Not on file  . Years of education: Not on file  . Highest education level: Not on file  Occupational History  . Not on file  Social Needs  . Financial resource strain: Not on file  . Food insecurity:    Worry: Not on file    Inability: Not on file  . Transportation needs:    Medical: Not on file    Non-medical: Not on file  Tobacco Use  . Smoking status: Former Games developer  . Smokeless tobacco: Never Used  Substance and Sexual Activity  . Alcohol use: Yes    Alcohol/week: 0.0 standard drinks    Comment: occ- pt denies SA use  . Drug use: No  . Sexual activity: Not on file  Lifestyle  . Physical activity:    Days per week: Not on file    Minutes per session: Not on file  .  Stress: Not on file  Relationships  . Social connections:    Talks on phone: Not on file    Gets together: Not on file    Attends religious service: Not on file    Active member of club or organization: Not on file    Attends meetings of clubs or organizations: Not on file    Relationship status: Not on file  Other Topics Concern  . Not on file  Social History Narrative  . Not on file    Allergies:  Allergies  Allergen Reactions  . Codeine Nausea And Vomiting  . Lexapro [Escitalopram Oxalate] Other (See Comments)    lethargic    Metabolic Disorder Labs: Lab Results  Component Value Date   HGBA1C 5.6 01/10/2014   MPG 114 01/10/2014   No results found for: PROLACTIN Lab Results  Component Value Date   CHOL 237 (H) 01/10/2014   TRIG 102 01/10/2014   HDL 57 01/10/2014   CHOLHDL 4.2 01/10/2014   VLDL 20 01/10/2014   LDLCALC 160 (H) 01/10/2014   Lab Results  Component Value Date   TSH 2.090 01/10/2014    Therapeutic Level Labs: No results found for: LITHIUM No results found for: VALPROATE No components found for:  CBMZ  Current Medications: Current Outpatient Medications  Medication Sig  Dispense Refill  . aspirin EC 81 MG tablet Take 1 tablet (81 mg total) by mouth daily. For heart health    . DULoxetine (CYMBALTA) 60 MG capsule Take 1 capsule (60 mg total) by mouth daily. 90 capsule 2  . hydrOXYzine (ATARAX/VISTARIL) 25 MG tablet Take 1 tablet (25 mg total) by mouth every 6 (six) hours as needed for anxiety. 180 tablet 2  . risperiDONE (RISPERDAL) 0.5 MG tablet Take 1 tablet (0.5 mg total) by mouth at bedtime. 90 tablet 2  . traZODone (DESYREL) 50 MG tablet Take 1 tablet (50 mg total) by mouth at bedtime as needed and may repeat dose one time if needed for sleep. 180 tablet 2   No current facility-administered medications for this visit.      Musculoskeletal: Strength & Muscle Tone: within normal limits Gait & Station: normal Patient leans:  N/A  Psychiatric Specialty Exam: Review of Systems  HENT: Positive for hearing loss.   All other systems reviewed and are negative.   Blood pressure 134/88, pulse (!) 109, height 6' (1.829 m), weight 167 lb (75.8 kg), SpO2 97 %.Body mass index is 22.65 kg/m.  General Appearance: Casual and Fairly Groomed  Eye Contact:  Good  Speech:  Clear and Coherent  Volume:  Normal  Mood:  Euthymic  Affect:  Congruent  Thought Process:  Goal Directed  Orientation:  Full (Time, Place, and Person)  Thought Content: WDL   Suicidal Thoughts:  No  Homicidal Thoughts:  No  Memory:  Immediate;   Good Recent;   Good Remote;   Fair  Judgement:  Good  Insight:  Good  Psychomotor Activity:  Normal  Concentration:  Concentration: Good and Attention Span: Good  Recall:  Good  Fund of Knowledge: Good  Language: Good  Akathisia:  No  Handed:  Right  AIMS (if indicated): not done  Assets:  Communication Skills Desire for Improvement Physical Health Resilience Social Support Talents/Skills  ADL's:  Intact  Cognition: WNL  Sleep:  Good   Screenings: AIMS     [facility]  AIMS Total Score  0    AUDIT     [facility]) [facility] Admission ([facility]  Alcohol Use Disorder Identification Test Final Score (AUDIT)  0  3       Assessment and Plan: This patient is a 62 year old male with a history of a severe depressive episode 2 years ago.  However he has been doing quite well on his current medications.  He will continue Cymbalta 60 mg daily for depression, hydroxyzine 25 mg every 6 hours as needed for anxiety, Risperdal 0.5 mg at bedtime for mood stabilization and trazodone 50 mg at bedtime for sleep.  He will return to see me in 8 months at his request   Diannia Ruder, MD 11/23/2017, 4:05 PM

## 2018-04-17 ENCOUNTER — Other Ambulatory Visit (HOSPITAL_COMMUNITY): Payer: Self-pay | Admitting: Psychiatry

## 2018-07-25 ENCOUNTER — Encounter (HOSPITAL_COMMUNITY): Payer: Self-pay | Admitting: Psychiatry

## 2018-07-25 ENCOUNTER — Ambulatory Visit (INDEPENDENT_AMBULATORY_CARE_PROVIDER_SITE_OTHER): Payer: BC Managed Care – PPO | Admitting: Psychiatry

## 2018-07-25 ENCOUNTER — Other Ambulatory Visit: Payer: Self-pay

## 2018-07-25 DIAGNOSIS — F331 Major depressive disorder, recurrent, moderate: Secondary | ICD-10-CM | POA: Diagnosis not present

## 2018-07-25 MED ORDER — TRAZODONE HCL 50 MG PO TABS: 50.0000 mg | ORAL_TABLET | Freq: Every evening | ORAL | 2 refills | Status: DC | PRN

## 2018-07-25 MED ORDER — RISPERIDONE 0.5 MG PO TABS: 0.5000 mg | ORAL_TABLET | Freq: Every day | ORAL | 2 refills | Status: DC

## 2018-07-25 MED ORDER — DULOXETINE HCL 60 MG PO CPEP: ORAL_CAPSULE | ORAL | 2 refills | Status: DC

## 2018-07-25 MED ORDER — HYDROXYZINE HCL 25 MG PO TABS: 25.0000 mg | ORAL_TABLET | Freq: Four times a day (QID) | ORAL | 2 refills | Status: DC | PRN

## 2018-07-25 NOTE — Progress Notes (Signed)
Virtual Visit via Telephone Note  I connected with Mike Moran on 07/25/18 at  4:00 PM EDT by telephone and verified that I am speaking with the correct person using two identifiers.   I discussed the limitations, risks, security and privacy concerns of performing an evaluation and management service by telephone and the availability of in person appointments. I also discussed with the patient that there may be a patient responsible charge related to this service. The patient expressed understanding and agreed to proceed.     I discussed the assessment and treatment plan with the patient. The patient was provided an opportunity to ask questions and all were answered. The patient agreed with the plan and demonstrated an understanding of the instructions.   The patient was advised to call back or seek an in-person evaluation if the symptoms worsen or if the condition fails to improve as anticipated.  I provided 15 minutes of non-face-to-face time during this encounter.   Levonne Spiller, MD  [facility] MD/PA/NP OP Progress Note  07/25/2018 4:21 PM Mike Moran  MRN:  [id number]  Chief Complaint:  Chief Complaint    Depression; Anxiety; Follow-up     HPI: This patient is a 63 year old white male who lives with his wife.  He is a retired Company secretary.  The patient returns to follow-up after 8 months regarding his depression and anxiety.  He was hospitalized after a suicide attempt in 2017 but has done well ever since.  He has been quite compliant with his medication.  He states that his mood is good his anxiety is under good control with the hydroxyzine he is sleeping well and has plenty of energy.  He has been spending a lot of time working in his yard and fishing.  He denies depressed mood or suicidal ideation Visit Diagnosis:    ICD-10-CM   1. Major depressive disorder, recurrent episode, moderate (HCC)  F33.1     Past Psychiatric History: none  Past Medical History:  Past Medical  History:  Diagnosis Date  . Depression    History reviewed. No pertinent surgical history.  Family Psychiatric History: none  Family History: History reviewed. No pertinent family history.  Social History:  Social History   Socioeconomic History  . Marital status: Married    Spouse name: Not on file  . Number of children: Not on file  . Years of education: Not on file  . Highest education level: Not on file  Occupational History  . Not on file  Social Needs  . Financial resource strain: Not on file  . Food insecurity    Worry: Not on file    Inability: Not on file  . Transportation needs    Medical: Not on file    Non-medical: Not on file  Tobacco Use  . Smoking status: Former Research scientist (life sciences)  . Smokeless tobacco: Never Used  Substance and Sexual Activity  . Alcohol use: Yes    Alcohol/week: 0.0 standard drinks    Comment: occ- pt denies SA use  . Drug use: No  . Sexual activity: Not on file  Lifestyle  . Physical activity    Days per week: Not on file    Minutes per session: Not on file  . Stress: Not on file  Relationships  . Social Herbalist on phone: Not on file    Gets together: Not on file    Attends religious service: Not on file    Active member of club or  organization: Not on file    Attends meetings of clubs or organizations: Not on file    Relationship status: Not on file  Other Topics Concern  . Not on file  Social History Narrative  . Not on file    Allergies:  Allergies  Allergen Reactions  . Codeine Nausea And Vomiting  . Lexapro [Escitalopram Oxalate] Other (See Comments)    lethargic    Metabolic Disorder Labs: Lab Results  Component Value Date   HGBA1C 5.6 01/10/2014   MPG 114 01/10/2014   No results found for: PROLACTIN Lab Results  Component Value Date   CHOL 237 (H) 01/10/2014   TRIG 102 01/10/2014   HDL 57 01/10/2014   CHOLHDL 4.2 01/10/2014   VLDL 20 01/10/2014   LDLCALC 160 (H) 01/10/2014   Lab Results   Component Value Date   TSH 2.090 01/10/2014    Therapeutic Level Labs: No results found for: LITHIUM No results found for: VALPROATE No components found for:  CBMZ  Current Medications: Current Outpatient Medications  Medication Sig Dispense Refill  . aspirin EC 81 MG tablet Take 1 tablet (81 mg total) by mouth daily. For heart health    . DULoxetine (CYMBALTA) 60 MG capsule TAKE (1) CAPSULE BY MOUTH ONCE DAILY. 90 capsule 2  . hydrOXYzine (ATARAX/VISTARIL) 25 MG tablet Take 1 tablet (25 mg total) by mouth every 6 (six) hours as needed. for anxiety 180 tablet 2  . risperiDONE (RISPERDAL) 0.5 MG tablet Take 1 tablet (0.5 mg total) by mouth at bedtime. 90 tablet 2  . traZODone (DESYREL) 50 MG tablet Take 1 tablet (50 mg total) by mouth at bedtime as needed and may repeat dose one time if needed for sleep. 180 tablet 2   No current facility-administered medications for this visit.      Musculoskeletal: Strength & Muscle Tone: within normal limits Gait & Station: normal Patient leans: N/A  Psychiatric Specialty Exam: Review of Systems  All other systems reviewed and are negative.   There were no vitals taken for this visit.There is no height or weight on file to calculate BMI.  General Appearance: NA  Eye Contact:  NA  Speech:  Clear and Coherent  Volume:  Normal  Mood:  Euthymic  Affect:  NA  Thought Process:  Goal Directed  Orientation:  Full (Time, Place, and Person)  Thought Content: WDL   Suicidal Thoughts:  No  Homicidal Thoughts:  No  Memory:  Immediate;   Good Recent;   Good Remote;   Good  Judgement:  Good  Insight:  Good  Psychomotor Activity:  Normal  Concentration:  Concentration: Good and Attention Span: Good  Recall:  Good  Fund of Knowledge: Good  Language: Good  Akathisia:  No  Handed:  Right  AIMS (if indicated): not done  Assets:  Communication Skills Desire for Improvement Physical Health Resilience Social Support Talents/Skills  ADL's:   Intact  Cognition: WNL  Sleep:  Good   Screenings: AIMS     [facility]  AIMS Total Score  0    AUDIT     [facility]) [facility] Admission (Dis[facility]  Alcohol Use Disorder Identification Test Final Score (AUDIT)  0  3       Assessment and Plan: This patient is a 63 year old male with a history of severe depressed mood about 3  years ago.  He continues to do very well on his current medications.  He will continue Cymbalta 60 mg daily for depression, hydroxyzine 25 mg every 6 hours as needed for anxiety, Risperdal 0.5 mg at bedtime for mood stabilization and trazodone 50 mg at bedtime for sleep.  He will return to see me in 9 months at his request.  He knows that he can call at anytime if symptoms recur   Diannia Rudereborah Jacobi Nile, MD 07/25/2018, 4:21 PM

## 2019-01-24 ENCOUNTER — Other Ambulatory Visit (HOSPITAL_COMMUNITY): Payer: Self-pay | Admitting: Psychiatry

## 2019-03-07 ENCOUNTER — Other Ambulatory Visit (HOSPITAL_COMMUNITY): Payer: Self-pay | Admitting: Psychiatry

## 2019-03-08 ENCOUNTER — Telehealth (HOSPITAL_COMMUNITY): Payer: Self-pay | Admitting: *Deleted

## 2019-03-08 ENCOUNTER — Other Ambulatory Visit (HOSPITAL_COMMUNITY): Payer: Self-pay | Admitting: Psychiatry

## 2019-03-08 MED ORDER — RISPERIDONE 0.5 MG PO TABS: 0.5000 mg | ORAL_TABLET | Freq: Every day | ORAL | 2 refills | Status: DC

## 2019-03-08 NOTE — Telephone Encounter (Signed)
Insurance will not cover  90 tabs for 30 days risperiDONE (IRISPERDAL) 0.5 MG tablet

## 2019-03-08 NOTE — Telephone Encounter (Signed)
I changed the quantity to 30

## 2019-03-08 NOTE — Telephone Encounter (Signed)
Insurance will not cover  90 tabs for 30 days risperiDONE (IRISPERDAL) 0.5 MG tablet 90 tablet

## 2019-03-13 ENCOUNTER — Telehealth (HOSPITAL_COMMUNITY): Payer: Self-pay | Admitting: *Deleted

## 2019-03-13 NOTE — Telephone Encounter (Signed)
[facility] [place] NOTICE OF APPROVAL MEDICARE PRESCRIPTION DRUG ## [phone]        FAX# [phone]   NON FORMULARY EXCEPTION REQUEST  HYDROXYZINE HCI 25 MG TAB            AUTHORIZATION:  03/12/2019   -----   03/11/2020

## 2019-04-27 ENCOUNTER — Other Ambulatory Visit (HOSPITAL_COMMUNITY): Payer: Self-pay | Admitting: Psychiatry

## 2019-04-30 DIAGNOSIS — A6920 Lyme disease, unspecified: Secondary | ICD-10-CM | POA: Diagnosis not present

## 2019-04-30 DIAGNOSIS — R2242 Localized swelling, mass and lump, left lower limb: Secondary | ICD-10-CM | POA: Diagnosis not present

## 2019-04-30 DIAGNOSIS — S80862A Insect bite (nonvenomous), left lower leg, initial encounter: Secondary | ICD-10-CM | POA: Diagnosis not present

## 2019-04-30 DIAGNOSIS — S81802A Unspecified open wound, left lower leg, initial encounter: Secondary | ICD-10-CM | POA: Diagnosis not present

## 2019-05-10 ENCOUNTER — Other Ambulatory Visit: Payer: Self-pay

## 2019-05-10 ENCOUNTER — Encounter (HOSPITAL_COMMUNITY): Payer: Self-pay | Admitting: Psychiatry

## 2019-05-10 ENCOUNTER — Ambulatory Visit (INDEPENDENT_AMBULATORY_CARE_PROVIDER_SITE_OTHER): Payer: BC Managed Care – PPO | Admitting: Psychiatry

## 2019-05-10 DIAGNOSIS — F331 Major depressive disorder, recurrent, moderate: Secondary | ICD-10-CM

## 2019-05-10 MED ORDER — TRAZODONE HCL 50 MG PO TABS: 50.0000 mg | ORAL_TABLET | Freq: Every evening | ORAL | 2 refills | Status: DC | PRN

## 2019-05-10 MED ORDER — RISPERIDONE 0.5 MG PO TABS: 0.5000 mg | ORAL_TABLET | Freq: Every day | ORAL | 11 refills | Status: DC

## 2019-05-10 MED ORDER — HYDROXYZINE HCL 25 MG PO TABS: 25.0000 mg | ORAL_TABLET | Freq: Four times a day (QID) | ORAL | 2 refills | Status: DC | PRN

## 2019-05-10 MED ORDER — DULOXETINE HCL 60 MG PO CPEP: ORAL_CAPSULE | ORAL | 2 refills | Status: DC

## 2019-05-10 NOTE — Progress Notes (Signed)
Virtual Visit via Telephone Note  I connected with Shara Blazing on 05/10/19 at  4:00 PM EDT by telephone and verified that I am speaking with the correct person using two identifiers.   I discussed the limitations, risks, security and privacy concerns of performing an evaluation and management service by telephone and the availability of in person appointments. I also discussed with the patient that there may be a patient responsible charge related to this service. The patient expressed understanding and agreed to proceed    I discussed the assessment and treatment plan with the patient. The patient was provided an opportunity to ask questions and all were answered. The patient agreed with the plan and demonstrated an understanding of the instructions.   The patient was advised to call back or seek an in-person evaluation if the symptoms worsen or if the condition fails to improve as anticipated.  I provided 15 minutes of non-face-to-face time during this encounter.   Diannia Ruder, MD  [facility] MD/PA/NP OP Progress Note  05/10/2019 4:08 PM DONN ZANETTI  MRN:  [id number]  Chief Complaint:  Chief Complaint    Depression; Anxiety; Follow-up     HPI: This patient is a 64 year old white male who lives with his wife.  He is a retired Engineer, site.  The patient returns for follow-up 9 months regarding his depression and anxiety.  He was hospitalized after suicide attempt in 2017 but has done well ever since.  He has been very compliant with all of his medications.  He states his mood is good he is staying active and doing things with family.  His anxiety is under good control and he is sleeping well.  He is spending a lot of time with his grandchildren.  He denies being depressed or having any suicidal ideation.  He denies side effects from medication. Visit Diagnosis:    ICD-10-CM   1. Major depressive disorder, recurrent episode, moderate (HCC)  F33.1     Past Psychiatric History:  none  Past Medical History:  Past Medical History:  Diagnosis Date  . Depression    History reviewed. No pertinent surgical history.  Family Psychiatric History: see below  Family History: History reviewed. No pertinent family history.  Social History:  Social History   Socioeconomic History  . Marital status: Married    Spouse name: Not on file  . Number of children: Not on file  . Years of education: Not on file  . Highest education level: Not on file  Occupational History  . Not on file  Tobacco Use  . Smoking status: Former Games developer  . Smokeless tobacco: Never Used  Substance and Sexual Activity  . Alcohol use: Yes    Alcohol/week: 0.0 standard drinks    Comment: occ- pt denies SA use  . Drug use: No  . Sexual activity: Not on file  Other Topics Concern  . Not on file  Social History Narrative  . Not on file   Social Determinants of Health   Financial Resource Strain:   . Difficulty of Paying Living Expenses:   Food Insecurity:   . Worried About Programme researcher, broadcasting/film/video in the Last Year:   . Barista in the Last Year:   Transportation Needs:   . Freight forwarder (Medical):   Marland Kitchen Lack of Transportation (Non-Medical):   Physical Activity:   . Days of Exercise per Week:   . Minutes of Exercise per Session:   Stress:   . Feeling  of Stress :   Social Connections:   . Frequency of Communication with Friends and Family:   . Frequency of Social Gatherings with Friends and Family:   . Attends Religious Services:   . Active Member of Clubs or Organizations:   . Attends Banker Meetings:   Marland Kitchen Marital Status:     Allergies:  Allergies  Allergen Reactions  . Codeine Nausea And Vomiting  . Lexapro [Escitalopram Oxalate] Other (See Comments)    lethargic    Metabolic Disorder Labs: Lab Results  Component Value Date   HGBA1C 5.6 01/10/2014   MPG 114 01/10/2014   No results found for: PROLACTIN Lab Results  Component Value Date   CHOL  237 (H) 01/10/2014   TRIG 102 01/10/2014   HDL 57 01/10/2014   CHOLHDL 4.2 01/10/2014   VLDL 20 01/10/2014   LDLCALC 160 (H) 01/10/2014   Lab Results  Component Value Date   TSH 2.090 01/10/2014    Therapeutic Level Labs: No results found for: LITHIUM No results found for: VALPROATE No components found for:  CBMZ  Current Medications: Current Outpatient Medications  Medication Sig Dispense Refill  . aspirin EC 81 MG tablet Take 1 tablet (81 mg total) by mouth daily. For heart health    . DULoxetine (CYMBALTA) 60 MG capsule TAKE (1) CAPSULE BY MOUTH ONCE DAILY. 90 capsule 2  . hydrOXYzine (ATARAX/VISTARIL) 25 MG tablet Take 1 tablet (25 mg total) by mouth every 6 (six) hours as needed. for anxiety 180 tablet 2  . risperiDONE (RISPERDAL) 0.5 MG tablet Take 1 tablet (0.5 mg total) by mouth at bedtime. 30 tablet 11  . traZODone (DESYREL) 50 MG tablet Take 1 tablet (50 mg total) by mouth at bedtime as needed and may repeat dose one time if needed for sleep. 180 tablet 2   No current facility-administered medications for this visit.     Musculoskeletal: Strength & Muscle Tone: within normal limits Gait & Station: normal Patient leans: N/A  Psychiatric Specialty Exam: Review of Systems  All other systems reviewed and are negative.   There were no vitals taken for this visit.There is no height or weight on file to calculate BMI.  General Appearance: NA  Eye Contact:  NA  Speech:  Clear and Coherent  Volume:  Normal  Mood:  Euthymic  Affect:  NA  Thought Process:  Goal Directed  Orientation:  Full (Time, Place, and Person)  Thought Content: WDL   Suicidal Thoughts:  No  Homicidal Thoughts:  No  Memory:  Immediate;   Good Recent;   Good Remote;   Good  Judgement:  Good  Insight:  Fair  Psychomotor Activity:  Normal  Concentration:  Concentration: Good and Attention Span: Good  Recall:  Good  Fund of Knowledge: Good  Language: Good  Akathisia:  No  Handed:  Right   AIMS (if indicated): not done  Assets:  Communication Skills Desire for Improvement Physical Health Resilience Social Support Talents/Skills  ADL's:  Intact  Cognition: WNL  Sleep:  Good   Screenings: AIMS     [facility]  AIMS Total Score  0    AUDIT     [facility]) [facility] Ad[facility]  Alcohol Use Disorder Identification Test Final Score (AUDIT)  0  3       Assessment and Plan: This  patient is a 64 year old male with a history of severe depressed mood about 5 years ago.  He continues to do very well on his current regimen.  He will continue Cymbalta 60 mg daily for depression, hydroxyzine 25 mg every 6 hours as needed for anxiety, Risperdal 0.5 mg at bedtime for mood stabilization and trazodone 50 mg at bedtime for sleep.  He asked to see me back in 1 year but he knows that he can call sooner if symptoms recur.   Levonne Spiller, MD 05/10/2019, 4:08 PM

## 2019-05-20 ENCOUNTER — Emergency Department (HOSPITAL_COMMUNITY)
Admission: EM | Admit: 2019-05-20 | Discharge: 2019-05-20 | Disposition: A | Discharge status: Home / Self Care | Payer: Medicare Other | Attending: Emergency Medicine | Admitting: Emergency Medicine

## 2019-05-20 ENCOUNTER — Other Ambulatory Visit: Payer: Self-pay

## 2019-05-20 DIAGNOSIS — T365X5A Adverse effect of aminoglycosides, initial encounter: Secondary | ICD-10-CM

## 2019-05-20 DIAGNOSIS — Y9389 Activity, other specified: Secondary | ICD-10-CM | POA: Insufficient documentation

## 2019-05-20 DIAGNOSIS — Y999 Unspecified external cause status: Secondary | ICD-10-CM | POA: Diagnosis not present

## 2019-05-20 DIAGNOSIS — L233 Allergic contact dermatitis due to drugs in contact with skin: Secondary | ICD-10-CM | POA: Insufficient documentation

## 2019-05-20 DIAGNOSIS — Z7982 Long term (current) use of aspirin: Secondary | ICD-10-CM | POA: Diagnosis not present

## 2019-05-20 DIAGNOSIS — W57XXXA Bitten or stung by nonvenomous insect and other nonvenomous arthropods, initial encounter: Secondary | ICD-10-CM | POA: Diagnosis not present

## 2019-05-20 DIAGNOSIS — Z79899 Other long term (current) drug therapy: Secondary | ICD-10-CM | POA: Diagnosis not present

## 2019-05-20 DIAGNOSIS — Y9289 Other specified places as the place of occurrence of the external cause: Secondary | ICD-10-CM | POA: Insufficient documentation

## 2019-05-20 DIAGNOSIS — S80862A Insect bite (nonvenomous), left lower leg, initial encounter: Secondary | ICD-10-CM | POA: Diagnosis present

## 2019-05-20 DIAGNOSIS — L251 Unspecified contact dermatitis due to drugs in contact with skin: Secondary | ICD-10-CM

## 2019-05-20 MED ORDER — TRIAMCINOLONE ACETONIDE 0.1 % EX CREA: 1.0000 "application " | TOPICAL_CREAM | Freq: Three times a day (TID) | CUTANEOUS | 0 refills | Status: DC

## 2019-05-20 MED ORDER — DOXYCYCLINE HYCLATE 100 MG PO CAPS: 100.0000 mg | ORAL_CAPSULE | Freq: Two times a day (BID) | ORAL | 0 refills | Status: DC

## 2019-05-20 NOTE — ED Triage Notes (Signed)
Evaluated 2 weeks ago at Urgent care   Dx with a tick bite  Here due to increased redness and pain to his L lower leg   He has not followed with PCP  Finished doxycycline with no improvement

## 2019-05-20 NOTE — Discharge Instructions (Addendum)
Stop using the Neosporin ointment.  Clean the affected area with mild soap and water and dry the area gently.  You may apply a small amount of the prescription triamcinolone cream 3 times a day.  Keep the area open and only bandage if needed at night.  Return to the emergency department if you develop worsening symptoms such as increasing redness, fever, swelling or increasing pain

## 2019-05-20 NOTE — ED Provider Notes (Signed)
[facility] EMERGENCY DEPARTMENT Provider Note   CSN: [id number] Arrival date & time: 05/20/19  1554     History Chief Complaint  Patient presents with  . Insect Bite    Mike Moran is a 64 y.o. male.  HPI      Mike Moran is a 64 y.o. male who presents to the Emergency Department requesting evaluation of a tick bite that occurred 2 weeks ago.  He states that he removed "some type of insect" that was attached to the skin of his left lower leg.  He believed this to be a tick.  He states he removed with his fingers and scraped the wound with a towel.  He was seen at a local urgent care and prescribed doxycycline which he took twice daily for 10 days.  He has been keeping the wound bandaged and applying liberal amounts of Neosporin.  He comes to the emergency department today due to increasing redness, burning, and itching to his left lower leg.  He denies fever, chills, red streaking, and swelling of the extremity   Past Medical History:  Diagnosis Date  . Depression     Patient Active Problem List   Diagnosis Date Noted  . Severe episode of recurrent major depressive disorder, without psychotic features ([place])   . GAD (generalized anxiety disorder)     No past surgical history on file.     No family history on file.  Social History   Tobacco Use  . Smoking status: Former Research scientist (life sciences)  . Smokeless tobacco: Never Used  Substance Use Topics  . Alcohol use: Yes    Alcohol/week: 0.0 standard drinks    Comment: occ- pt denies SA use  . Drug use: No    Home Medications Prior to Admission medications   Medication Sig Start Date End Date Taking? Authorizing Provider  aspirin EC 81 MG tablet Take 1 tablet (81 mg total) by mouth daily. For heart health 08/04/15   Lindell Spar I, NP  DULoxetine (CYMBALTA) 60 MG capsule TAKE (1) CAPSULE BY MOUTH ONCE DAILY. 05/10/19   Cloria Spring, MD  hydrOXYzine (ATARAX/VISTARIL) 25 MG tablet Take 1 tablet (25 mg total) by mouth every 6  (six) hours as needed. for anxiety 05/10/19   Cloria Spring, MD  risperiDONE (RISPERDAL) 0.5 MG tablet Take 1 tablet (0.5 mg total) by mouth at bedtime. 05/10/19   Cloria Spring, MD  traZODone (DESYREL) 50 MG tablet Take 1 tablet (50 mg total) by mouth at bedtime as needed and may repeat dose one time if needed for sleep. 05/10/19   Cloria Spring, MD    Allergies    Codeine and Lexapro [escitalopram oxalate]  Review of Systems   Review of Systems  Constitutional: Negative for activity change, appetite change, chills and fever.  Respiratory: Negative for chest tightness and shortness of breath.   Cardiovascular: Negative for chest pain.  Gastrointestinal: Negative for nausea and vomiting.  Musculoskeletal: Negative for arthralgias and myalgias.  Skin: Positive for color change, rash and wound.       Tick bite left lower leg  Neurological: Negative for dizziness, weakness, numbness and headaches.    Physical Exam Updated Vital Signs BP 109/73   Pulse 78   Temp 98.2 F (36.8 C) (Oral)   Resp 16   Ht 6' (1.829 m)   Wt 68 kg   SpO2 98%   BMI 20.34 kg/m   Physical Exam Vitals and nursing note reviewed.  Constitutional:  General: He is not in acute distress.    Appearance: Normal appearance. He is well-developed.  HENT:     Head: Normocephalic.     Mouth/Throat:     Mouth: Mucous membranes are moist.     Pharynx: Oropharynx is clear.  Cardiovascular:     Rate and Rhythm: Normal rate and regular rhythm.     Pulses: Normal pulses.     Heart sounds: No murmur.  Pulmonary:     Effort: Pulmonary effort is normal. No respiratory distress.     Breath sounds: Normal breath sounds.  Musculoskeletal:        General: No tenderness. Normal range of motion.     Right lower leg: No edema.     Left lower leg: No edema.  Lymphadenopathy:     Cervical: No cervical adenopathy.  Skin:    General: Skin is warm.     Capillary Refill: Capillary refill takes less than 2 seconds.      Findings: Erythema and rash present.     Comments: Nearly circumferential, confluent erythema of the left lower leg.  No edema, no lymphangitis. No erythema or edema of the left foot.  See attached photo.    Neurological:     Mental Status: He is alert and oriented to person, place, and time.     Sensory: No sensory deficit.     Motor: No weakness or abnormal muscle tone.       ED Results / Procedures / Treatments   Labs (all labs ordered are listed, but only abnormal results are displayed) Labs Reviewed - No data to display  EKG None  Radiology No results found.  Procedures Procedures (including critical care time)  Medications Ordered in ED Medications - No data to display  ED Course  I have reviewed the triage vital signs and the nursing notes.  Pertinent labs & imaging results that were available during my care of the patient were reviewed by me and considered in my medical decision making (see chart for details).    MDM Rules/Calculators/A&P                      Pt with confluent erythema to a focal area of the left lower leg at the site of a tick bite that occurred 2 weeks ago.  Rash appears c/w a contact dermatitis likely related to neosporin use.  Pt well appearing and no systemic symptoms. Pt advised to d/c neosporin use.  Return precautions discussed.     Final Clinical Impression(s) / ED Diagnoses Final diagnoses:  Contact dermatitis due to neomycin  Tick bite, initial encounter    Rx / DC Orders ED Discharge Orders    None       Pauline Aus, Cordelia Poche 05/21/19 1229    Maia Plan, MD 05/21/19 1943

## 2019-05-24 ENCOUNTER — Telehealth (HOSPITAL_COMMUNITY): Payer: Self-pay | Admitting: *Deleted

## 2019-05-24 NOTE — Telephone Encounter (Signed)
BC/BS APPEALS DETERMINATION APPROVED risperiDONE (RISPERDAL) 0.5 MG tablet  AUTHORIZATION # : [id number]           EFFECTIVE:   05-11-2019    THRU     05-23-2020

## 2019-10-01 ENCOUNTER — Other Ambulatory Visit (HOSPITAL_COMMUNITY): Payer: Self-pay | Admitting: Psychiatry

## 2019-10-23 ENCOUNTER — Other Ambulatory Visit (HOSPITAL_COMMUNITY): Payer: Self-pay | Admitting: Psychiatry

## 2019-12-03 ENCOUNTER — Other Ambulatory Visit (HOSPITAL_COMMUNITY): Payer: Self-pay | Admitting: Psychiatry

## 2019-12-20 ENCOUNTER — Other Ambulatory Visit (HOSPITAL_COMMUNITY): Payer: Self-pay | Admitting: Psychiatry

## 2019-12-20 MED ORDER — DULOXETINE HCL 60 MG PO CPEP
60.0000 mg | ORAL_CAPSULE | Freq: Every day | ORAL | 2 refills | Status: DC
Start: 2019-12-20 — End: 2020-02-12

## 2019-12-20 MED ORDER — TRAZODONE HCL 50 MG PO TABS
50.0000 mg | ORAL_TABLET | Freq: Every evening | ORAL | 2 refills | Status: DC | PRN
Start: 2019-12-20 — End: 2020-02-12

## 2020-01-30 ENCOUNTER — Other Ambulatory Visit: Payer: Self-pay | Admitting: Psychiatry

## 2020-01-30 ENCOUNTER — Telehealth (HOSPITAL_COMMUNITY): Payer: Self-pay | Admitting: *Deleted

## 2020-01-30 MED ORDER — HYDROXYZINE HCL 25 MG PO TABS
25.0000 mg | ORAL_TABLET | Freq: Four times a day (QID) | ORAL | 0 refills | Status: DC | PRN
Start: 2020-01-30 — End: 2020-02-04

## 2020-01-30 NOTE — Telephone Encounter (Signed)
Pt pharmacy [facility] in [place] calling stating patient is requesting his refills for Hydroxyzine. Per provider's note, pt need to f/u in 1 year.

## 2020-01-30 NOTE — Telephone Encounter (Signed)
Ordered hydroxyzine. Please advise the patient to have follow up in April with Dr. Tenny Craw

## 2020-01-31 ENCOUNTER — Other Ambulatory Visit: Payer: Self-pay | Admitting: Psychiatry

## 2020-01-31 ENCOUNTER — Telehealth: Payer: Self-pay | Admitting: Psychiatry

## 2020-01-31 ENCOUNTER — Other Ambulatory Visit (HOSPITAL_COMMUNITY): Payer: Self-pay | Admitting: Psychiatry

## 2020-01-31 ENCOUNTER — Telehealth (HOSPITAL_COMMUNITY): Payer: Self-pay | Admitting: *Deleted

## 2020-01-31 MED ORDER — HYDROXYZINE HCL 25 MG PO TABS: 25.0000 mg | ORAL_TABLET | Freq: Four times a day (QID) | ORAL | 0 refills | Status: DC | PRN

## 2020-01-31 NOTE — Telephone Encounter (Signed)
[facility] °

## 2020-01-31 NOTE — Telephone Encounter (Signed)
noted 

## 2020-01-31 NOTE — Telephone Encounter (Signed)
To be clear, I received your message yesterday, stating that "Pt pharmacy [facility] in [place] calling stating patient is requesting his refills for Hydroxyzine. Per provider's note, pt need to f/u in 1 year." I ordered med to that pharmacy yesterday. Did you mean it was also [facility]?

## 2020-01-31 NOTE — Telephone Encounter (Signed)
Pt pharmacy [facility] in [place]  calling stating patient is requesting his refills for Hydroxyzine. Per provider's note, pt need to f/u in 1 year.  °

## 2020-01-31 NOTE — Telephone Encounter (Signed)
It was ordered. Please cancel the order sent to [facility] in [place]

## 2020-01-31 NOTE — Telephone Encounter (Signed)
Will cancel the one ordered to [id number] - [place], [place]

## 2020-01-31 NOTE — Telephone Encounter (Signed)
Called [facility] and informed them that provider sent script to another pharmacy and patient is trying to pick up script at that pharmacy. Per pharmacist they will not fill medication.   Staff tried calling patient and was not able to reach him on the mobile number and a male picked up stated that I called the wrong number. Staff called the home number on file and pt wife gived pt new mobile number which was [phone] and was still not able to reach patient or leave a message.

## 2020-01-31 NOTE — Telephone Encounter (Signed)
[facility] in [place] is the correct pharmacy

## 2020-02-12 ENCOUNTER — Other Ambulatory Visit: Payer: Self-pay

## 2020-02-12 ENCOUNTER — Telehealth (INDEPENDENT_AMBULATORY_CARE_PROVIDER_SITE_OTHER): Payer: Self-pay | Admitting: Psychiatry

## 2020-02-12 ENCOUNTER — Encounter (HOSPITAL_COMMUNITY): Payer: Self-pay | Admitting: Psychiatry

## 2020-02-12 DIAGNOSIS — F331 Major depressive disorder, recurrent, moderate: Secondary | ICD-10-CM

## 2020-02-12 MED ORDER — HYDROXYZINE HCL 25 MG PO TABS
25.0000 mg | ORAL_TABLET | Freq: Four times a day (QID) | ORAL | 3 refills | Status: DC | PRN
Start: 2020-02-12 — End: 2020-02-29

## 2020-02-12 MED ORDER — RISPERIDONE 0.5 MG PO TABS
ORAL_TABLET | ORAL | 3 refills | Status: DC
Start: 2020-02-12 — End: 2020-02-29

## 2020-02-12 MED ORDER — DULOXETINE HCL 60 MG PO CPEP
60.0000 mg | ORAL_CAPSULE | Freq: Every day | ORAL | 3 refills | Status: DC
Start: 2020-02-12 — End: 2020-02-29

## 2020-02-12 MED ORDER — TRAZODONE HCL 50 MG PO TABS
50.0000 mg | ORAL_TABLET | Freq: Every evening | ORAL | 3 refills | Status: DC | PRN
Start: 2020-02-12 — End: 2020-02-29

## 2020-02-12 NOTE — Progress Notes (Signed)
Virtual Visit via Telephone Note  I connected with Mike Moran on 02/12/20 at  9:40 AM EST by telephone and verified that I am speaking with the correct person using two identifiers.  Location: Patient: home Provider: office   I discussed the limitations, risks, security and privacy concerns of performing an evaluation and management service by telephone and the availability of in person appointments. I also discussed with the patient that there may be a patient responsible charge related to this service. The patient expressed understanding and agreed to proceed.      I discussed the assessment and treatment plan with the patient. The patient was provided an opportunity to ask questions and all were answered. The patient agreed with the plan and demonstrated an understanding of the instructions.   The patient was advised to call back or seek an in-person evaluation if the symptoms worsen or if the condition fails to improve as anticipated.  I provided 15 minutes of non-face-to-face time during this encounter.   Diannia Ruder, MD  [facility] MD/PA/NP OP Progress Note  02/12/2020 9:53 AM Mike Moran  MRN:  [id number]  Chief Complaint:  Chief Complaint    Depression; Anxiety; Follow-up     HPI: This patient is a 65 year old married white male who lives with his wife in [place].  He is retired Engineer, site.  The patient returns for follow-up after about 9 months regarding his depression and anxiety.  He was hospitalized after a suicide attempt in 2017 but has done very well ever since.  He continues to be compliant with all of his medications.  He has gotten a place at [employer] and is staying very active doing kayaking and fishing.  His anxiety is under good control and he is sleeping well.  He denies being depressed or having suicidal ideation. Visit Diagnosis:    ICD-10-CM   1. Major depressive disorder, recurrent episode, moderate (HCC)  F33.1     Past Psychiatric History:  none  Past Medical History:  Past Medical History:  Diagnosis Date  . Depression    History reviewed. No pertinent surgical history.  Family Psychiatric History: none  Family History: History reviewed. No pertinent family history.  Social History:  Social History   Socioeconomic History  . Marital status: Married    Spouse name: Not on file  . Number of children: Not on file  . Years of education: Not on file  . Highest education level: Not on file  Occupational History  . Not on file  Tobacco Use  . Smoking status: Former Games developer  . Smokeless tobacco: Never Used  Substance and Sexual Activity  . Alcohol use: Yes    Alcohol/week: 0.0 standard drinks    Comment: occ- pt denies SA use  . Drug use: No  . Sexual activity: Not on file  Other Topics Concern  . Not on file  Social History Narrative  . Not on file   Social Determinants of Health   Financial Resource Strain: Not on file  Food Insecurity: Not on file  Transportation Needs: Not on file  Physical Activity: Not on file  Stress: Not on file  Social Connections: Not on file    Allergies:  Allergies  Allergen Reactions  . Codeine Nausea And Vomiting  . Lexapro [Escitalopram Oxalate] Other (See Comments)    lethargic    Metabolic Disorder Labs: Lab Results  Component Value Date   HGBA1C 5.6 01/10/2014   MPG 114 01/10/2014   No results  found for: PROLACTIN Lab Results  Component Value Date   CHOL 237 (H) 01/10/2014   TRIG 102 01/10/2014   HDL 57 01/10/2014   CHOLHDL 4.2 01/10/2014   VLDL 20 01/10/2014   LDLCALC 160 (H) 01/10/2014   Lab Results  Component Value Date   TSH 2.090 01/10/2014    Therapeutic Level Labs: No results found for: LITHIUM No results found for: VALPROATE No components found for:  CBMZ  Current Medications: Current Outpatient Medications  Medication Sig Dispense Refill  . aspirin EC 81 MG tablet Take 1 tablet (81 mg total) by mouth daily. For heart health    .  doxycycline (VIBRAMYCIN) 100 MG capsule Take 1 capsule (100 mg total) by mouth 2 (two) times daily. 14 capsule 0  . DULoxetine (CYMBALTA) 60 MG capsule Take 1 capsule (60 mg total) by mouth daily. 90 capsule 3  . hydrOXYzine (ATARAX/VISTARIL) 25 MG tablet Take 1 tablet (25 mg total) by mouth every 6 (six) hours as needed. for anxiety 360 tablet 3  . risperiDONE (RISPERDAL) 0.5 MG tablet TAKE (1) TABLET BY MOUTH AT BEDTIME. 90 tablet 3  . traZODone (DESYREL) 50 MG tablet Take 1 tablet (50 mg total) by mouth at bedtime as needed and may repeat dose one time if needed for sleep. 180 tablet 3  . triamcinolone cream (KENALOG) 0.1 % Apply 1 application topically 3 (three) times daily. To the affected area 30 g 0   No current facility-administered medications for this visit.     Musculoskeletal: Strength & Muscle Tone: within normal limits Gait & Station: normal Patient leans: N/A  Psychiatric Specialty Exam: Review of Systems  All other systems reviewed and are negative.   There were no vitals taken for this visit.There is no height or weight on file to calculate BMI.  General Appearance: NA  Eye Contact:  NA  Speech:  Clear and Coherent  Volume:  Normal  Mood:  Euthymic  Affect:  NA  Thought Process:  Goal Directed  Orientation:  Full (Time, Place, and Person)  Thought Content: WDL   Suicidal Thoughts:  No  Homicidal Thoughts:  No  Memory:  Immediate;   Good Recent;   Good Remote;   Fair  Judgement:  Good  Insight:  Good  Psychomotor Activity:  Normal  Concentration:  Concentration: Good and Attention Span: Good  Recall:  Good  Fund of Knowledge: Good  Language: Good  Akathisia:  No  Handed:  Right  AIMS (if indicated): not done  Assets:  Communication Skills Desire for Improvement Physical Health Resilience Social Support Talents/Skills  ADL's:  Intact  Cognition: WNL  Sleep:  Good   Screenings: AIMS   Flowsheet Row Admission (Discharged) from 07/30/2015 in  [place] [facility]  AIMS Total Score 0    AUDIT   Flowsheet Row Admission (Discharged) from 07/30/2015 in [place] [facility] Admission (Discharged) from 01/09/2014 in [place] [facility]  Alcohol Use Disorder Identification Test Final Score (AUDIT) 0 3       Assessment and Plan: This patient is a 65 year old male with a history of severe depression about 5 years ago.  He continues to do very well on his current regimen.  He will continue Cymbalta 60 mg daily for depression, hydroxyzine 25 mg every 6 hours as needed for anxiety, Risperdal 0.5 mg at bedtime for mood stabilization and trazodone 50 to 100 mg at bedtime for sleep.  He asked to come back  in 1 year but knows that he can call anytime if need be.   Diannia Ruder, MD 02/12/2020, 9:53 AM

## 2020-02-29 ENCOUNTER — Other Ambulatory Visit (HOSPITAL_COMMUNITY): Payer: Self-pay | Admitting: Psychiatry

## 2020-02-29 ENCOUNTER — Telehealth (HOSPITAL_COMMUNITY): Payer: Self-pay | Admitting: *Deleted

## 2020-02-29 MED ORDER — DULOXETINE HCL 60 MG PO CPEP: 60.0000 mg | ORAL_CAPSULE | Freq: Every day | ORAL | 0 refills | Status: DC

## 2020-02-29 MED ORDER — TRAZODONE HCL 50 MG PO TABS: 50.0000 mg | ORAL_TABLET | Freq: Every evening | ORAL | 0 refills | Status: DC | PRN

## 2020-02-29 MED ORDER — RISPERIDONE 0.5 MG PO TABS: ORAL_TABLET | ORAL | 0 refills | Status: DC

## 2020-02-29 MED ORDER — HYDROXYZINE HCL 25 MG PO TABS: 25.0000 mg | ORAL_TABLET | Freq: Four times a day (QID) | ORAL | 0 refills | Status: DC | PRN

## 2020-02-29 NOTE — Telephone Encounter (Signed)
Patient called stating he came [place] from the beach about 2 days ago and just realize he left ALL of his medications at beach. Per pt  He just need provider to prescribe him just 8 days to get him by. Pharmacy [facility].

## 2020-02-29 NOTE — Telephone Encounter (Signed)
Informed pt with what provider stated and he verbalized understanding

## 2020-02-29 NOTE — Telephone Encounter (Signed)
sent 

## 2020-03-14 ENCOUNTER — Other Ambulatory Visit (HOSPITAL_COMMUNITY): Payer: Self-pay | Admitting: Psychiatry

## 2020-03-25 ENCOUNTER — Encounter (HOSPITAL_COMMUNITY): Payer: Self-pay | Admitting: Emergency Medicine

## 2020-03-25 ENCOUNTER — Inpatient Hospital Stay (HOSPITAL_COMMUNITY): Payer: Medicare Other

## 2020-03-25 ENCOUNTER — Inpatient Hospital Stay (HOSPITAL_COMMUNITY)
Admission: EM | Admit: 2020-03-25 | Discharge: 2020-03-30 | DRG: 871 | Disposition: A | Discharge status: Home / Self Care | Payer: Medicare Other | Attending: Internal Medicine | Admitting: Internal Medicine

## 2020-03-25 ENCOUNTER — Emergency Department (HOSPITAL_COMMUNITY): Payer: Medicare Other

## 2020-03-25 ENCOUNTER — Other Ambulatory Visit: Payer: Self-pay

## 2020-03-25 DIAGNOSIS — J90 Pleural effusion, not elsewhere classified: Secondary | ICD-10-CM | POA: Diagnosis not present

## 2020-03-25 DIAGNOSIS — R0602 Shortness of breath: Secondary | ICD-10-CM | POA: Diagnosis not present

## 2020-03-25 DIAGNOSIS — J939 Pneumothorax, unspecified: Secondary | ICD-10-CM | POA: Diagnosis not present

## 2020-03-25 DIAGNOSIS — J9811 Atelectasis: Secondary | ICD-10-CM | POA: Diagnosis not present

## 2020-03-25 DIAGNOSIS — J9601 Acute respiratory failure with hypoxia: Secondary | ICD-10-CM | POA: Diagnosis not present

## 2020-03-25 DIAGNOSIS — D75839 Thrombocytosis, unspecified: Secondary | ICD-10-CM

## 2020-03-25 DIAGNOSIS — R7989 Other specified abnormal findings of blood chemistry: Secondary | ICD-10-CM | POA: Diagnosis not present

## 2020-03-25 DIAGNOSIS — K2289 Other specified disease of esophagus: Secondary | ICD-10-CM | POA: Diagnosis not present

## 2020-03-25 DIAGNOSIS — Y900 Blood alcohol level of less than 20 mg/100 ml: Secondary | ICD-10-CM | POA: Diagnosis present

## 2020-03-25 DIAGNOSIS — D72829 Elevated white blood cell count, unspecified: Secondary | ICD-10-CM

## 2020-03-25 DIAGNOSIS — R7881 Bacteremia: Secondary | ICD-10-CM | POA: Diagnosis not present

## 2020-03-25 DIAGNOSIS — Z885 Allergy status to narcotic agent status: Secondary | ICD-10-CM

## 2020-03-25 DIAGNOSIS — F32A Depression, unspecified: Secondary | ICD-10-CM | POA: Diagnosis not present

## 2020-03-25 DIAGNOSIS — J9691 Respiratory failure, unspecified with hypoxia: Secondary | ICD-10-CM

## 2020-03-25 DIAGNOSIS — A419 Sepsis, unspecified organism: Secondary | ICD-10-CM

## 2020-03-25 DIAGNOSIS — Z888 Allergy status to other drugs, medicaments and biological substances status: Secondary | ICD-10-CM | POA: Diagnosis not present

## 2020-03-25 DIAGNOSIS — R911 Solitary pulmonary nodule: Secondary | ICD-10-CM

## 2020-03-25 DIAGNOSIS — I4430 Unspecified atrioventricular block: Secondary | ICD-10-CM

## 2020-03-25 DIAGNOSIS — J918 Pleural effusion in other conditions classified elsewhere: Secondary | ICD-10-CM | POA: Diagnosis not present

## 2020-03-25 DIAGNOSIS — U071 COVID-19: Secondary | ICD-10-CM | POA: Diagnosis not present

## 2020-03-25 DIAGNOSIS — Z7982 Long term (current) use of aspirin: Secondary | ICD-10-CM | POA: Diagnosis not present

## 2020-03-25 DIAGNOSIS — R6520 Severe sepsis without septic shock: Secondary | ICD-10-CM | POA: Diagnosis not present

## 2020-03-25 DIAGNOSIS — K224 Dyskinesia of esophagus: Secondary | ICD-10-CM | POA: Diagnosis not present

## 2020-03-25 DIAGNOSIS — J869 Pyothorax without fistula: Secondary | ICD-10-CM | POA: Diagnosis not present

## 2020-03-25 DIAGNOSIS — F1010 Alcohol abuse, uncomplicated: Secondary | ICD-10-CM | POA: Diagnosis not present

## 2020-03-25 DIAGNOSIS — Z79899 Other long term (current) drug therapy: Secondary | ICD-10-CM | POA: Diagnosis not present

## 2020-03-25 DIAGNOSIS — Z9689 Presence of other specified functional implants: Secondary | ICD-10-CM

## 2020-03-25 DIAGNOSIS — J1282 Pneumonia due to coronavirus disease 2019: Secondary | ICD-10-CM

## 2020-03-25 DIAGNOSIS — D649 Anemia, unspecified: Secondary | ICD-10-CM

## 2020-03-25 DIAGNOSIS — A411 Sepsis due to other specified staphylococcus: Secondary | ICD-10-CM | POA: Diagnosis not present

## 2020-03-25 DIAGNOSIS — Z87891 Personal history of nicotine dependence: Secondary | ICD-10-CM

## 2020-03-25 DIAGNOSIS — F411 Generalized anxiety disorder: Secondary | ICD-10-CM | POA: Diagnosis present

## 2020-03-25 DIAGNOSIS — R0789 Other chest pain: Secondary | ICD-10-CM | POA: Diagnosis not present

## 2020-03-25 DIAGNOSIS — R059 Cough, unspecified: Secondary | ICD-10-CM | POA: Diagnosis not present

## 2020-03-25 DIAGNOSIS — R001 Bradycardia, unspecified: Secondary | ICD-10-CM | POA: Diagnosis not present

## 2020-03-25 DIAGNOSIS — I442 Atrioventricular block, complete: Secondary | ICD-10-CM | POA: Diagnosis present

## 2020-03-25 DIAGNOSIS — K5900 Constipation, unspecified: Secondary | ICD-10-CM | POA: Diagnosis present

## 2020-03-25 DIAGNOSIS — R739 Hyperglycemia, unspecified: Secondary | ICD-10-CM | POA: Diagnosis present

## 2020-03-25 DIAGNOSIS — R069 Unspecified abnormalities of breathing: Secondary | ICD-10-CM | POA: Diagnosis not present

## 2020-03-25 DIAGNOSIS — B957 Other staphylococcus as the cause of diseases classified elsewhere: Secondary | ICD-10-CM | POA: Diagnosis not present

## 2020-03-25 DIAGNOSIS — J189 Pneumonia, unspecified organism: Secondary | ICD-10-CM | POA: Diagnosis not present

## 2020-03-25 DIAGNOSIS — Z4682 Encounter for fitting and adjustment of non-vascular catheter: Secondary | ICD-10-CM | POA: Diagnosis not present

## 2020-03-25 HISTORY — DX: Elevated white blood cell count, unspecified: D72.829

## 2020-03-25 HISTORY — DX: Pleural effusion, not elsewhere classified: J90

## 2020-03-25 HISTORY — DX: Anxiety disorder, unspecified: F41.9

## 2020-03-25 HISTORY — DX: Sepsis, unspecified organism: A41.9

## 2020-03-25 HISTORY — DX: Thrombocytosis, unspecified: D75.839

## 2020-03-25 HISTORY — DX: Unspecified atrioventricular block: I44.30

## 2020-03-25 HISTORY — DX: Anemia, unspecified: D64.9

## 2020-03-25 HISTORY — DX: Pneumonia due to coronavirus disease 2019: J12.82

## 2020-03-25 HISTORY — DX: Respiratory failure, unspecified with hypoxia: J96.91

## 2020-03-25 HISTORY — DX: Solitary pulmonary nodule: R91.1

## 2020-03-25 HISTORY — DX: Other specified abnormal findings of blood chemistry: R79.89

## 2020-03-25 LAB — C-REACTIVE PROTEIN: CRP: 19.7 mg/dL — ABNORMAL HIGH (ref <1.0)

## 2020-03-25 LAB — CBC WITH DIFFERENTIAL/PLATELET
Abs Immature Granulocytes: 0.2 10*3/uL — ABNORMAL HIGH (ref 0.00–0.07)
Basophils Absolute: 0.1 10*3/uL (ref 0.0–0.1)
Basophils Relative: 0 %
Eosinophils Absolute: 0 10*3/uL (ref 0.0–0.5)
Eosinophils Relative: 0 %
HCT: 34.5 % — ABNORMAL LOW (ref 39.0–52.0)
Hemoglobin: 11.6 g/dL — ABNORMAL LOW (ref 13.0–17.0)
Immature Granulocytes: 1 %
Lymphocytes Relative: 2 %
Lymphs Abs: 0.5 10*3/uL — ABNORMAL LOW (ref 0.7–4.0)
MCH: 30.6 pg (ref 26.0–34.0)
MCHC: 33.6 g/dL (ref 30.0–36.0)
MCV: 91 fL (ref 80.0–100.0)
Monocytes Absolute: 1.3 10*3/uL — ABNORMAL HIGH (ref 0.1–1.0)
Monocytes Relative: 4 %
Neutro Abs: 27.5 10*3/uL — ABNORMAL HIGH (ref 1.7–7.7)
Neutrophils Relative %: 93 %
Platelets: 467 10*3/uL — ABNORMAL HIGH (ref 150–400)
RBC: 3.79 MIL/uL — ABNORMAL LOW (ref 4.22–5.81)
RDW: 14.6 % (ref 11.5–15.5)
WBC: 29.5 10*3/uL — ABNORMAL HIGH (ref 4.0–10.5)
nRBC: 0 % (ref 0.0–0.2)

## 2020-03-25 LAB — COMPREHENSIVE METABOLIC PANEL
ALT: 14 U/L (ref 0–44)
AST: 16 U/L (ref 15–41)
Albumin: 3.1 g/dL — ABNORMAL LOW (ref 3.5–5.0)
Alkaline Phosphatase: 50 U/L (ref 38–126)
Anion gap: 8 (ref 5–15)
BUN: 20 mg/dL (ref 8–23)
CO2: 26 mmol/L (ref 22–32)
Calcium: 8.9 mg/dL (ref 8.9–10.3)
Chloride: 101 mmol/L (ref 98–111)
Creatinine, Ser: 0.65 mg/dL (ref 0.61–1.24)
GFR, Estimated: >60 mL/min (ref >60)
Glucose, Bld: 133 mg/dL — ABNORMAL HIGH (ref 70–99)
Potassium: 3.6 mmol/L (ref 3.5–5.1)
Sodium: 135 mmol/L (ref 135–145)
Total Bilirubin: 0.5 mg/dL (ref 0.3–1.2)
Total Protein: 6.7 g/dL (ref 6.5–8.1)

## 2020-03-25 LAB — PROTEIN, TOTAL: Total Protein: 6.3 g/dL — ABNORMAL LOW (ref 6.5–8.1)

## 2020-03-25 LAB — POC SARS CORONAVIRUS 2 AG -  ED: SARS Coronavirus 2 Ag: NEGATIVE

## 2020-03-25 LAB — GLUCOSE, CAPILLARY
Glucose-Capillary: 135 mg/dL — ABNORMAL HIGH (ref 70–99)
Glucose-Capillary: 120 mg/dL — ABNORMAL HIGH (ref 70–99)
Glucose-Capillary: 138 mg/dL — ABNORMAL HIGH (ref 70–99)

## 2020-03-25 LAB — RESP PANEL BY RT-PCR (FLU A&B, COVID) ARPGX2
Influenza A by PCR: NEGATIVE
Influenza B by PCR: NEGATIVE
SARS Coronavirus 2 by RT PCR: POSITIVE — AB

## 2020-03-25 LAB — LACTATE DEHYDROGENASE: LDH: 99 U/L (ref 98–192)

## 2020-03-25 LAB — BODY FLUID CELL COUNT WITH DIFFERENTIAL
Eos, Fluid: 0 %
Lymphs, Fluid: 9 %
Monocyte-Macrophage-Serous Fluid: 6 % — ABNORMAL LOW (ref 50–90)
Neutrophil Count, Fluid: 85 % — ABNORMAL HIGH (ref 0–25)
Total Nucleated Cell Count, Fluid: 1075 cu mm — ABNORMAL HIGH (ref 0–1000)

## 2020-03-25 LAB — RAPID URINE DRUG SCREEN, HOSP PERFORMED
Amphetamines: NOT DETECTED
Barbiturates: NOT DETECTED
Benzodiazepines: POSITIVE — AB
Cocaine: NOT DETECTED
Opiates: POSITIVE — AB
Tetrahydrocannabinol: POSITIVE — AB

## 2020-03-25 LAB — GLUCOSE, PLEURAL OR PERITONEAL FLUID: Glucose, Fluid: 33 mg/dL

## 2020-03-25 LAB — PROTEIN, PLEURAL OR PERITONEAL FLUID: Total protein, fluid: 4.3 g/dL

## 2020-03-25 LAB — FIBRINOGEN: Fibrinogen: 736 mg/dL — ABNORMAL HIGH (ref 210–475)

## 2020-03-25 LAB — LACTIC ACID, PLASMA
Lactic Acid, Venous: 1.7 mmol/L (ref 0.5–1.9)
Lactic Acid, Venous: 0.6 mmol/L (ref 0.5–1.9)

## 2020-03-25 LAB — MRSA PCR SCREENING: MRSA by PCR: NEGATIVE

## 2020-03-25 LAB — ETHANOL: Alcohol, Ethyl (B): <10 mg/dL (ref <10)

## 2020-03-25 LAB — TRIGLYCERIDES: Triglycerides: 75 mg/dL (ref <150)

## 2020-03-25 LAB — FERRITIN: Ferritin: 225 ng/mL (ref 24–336)

## 2020-03-25 LAB — TROPONIN I (HIGH SENSITIVITY)
Troponin I (High Sensitivity): 4 ng/L (ref <18)
Troponin I (High Sensitivity): 5 ng/L (ref <18)

## 2020-03-25 LAB — LACTATE DEHYDROGENASE, PLEURAL OR PERITONEAL FLUID: LD, Fluid: 528 U/L — ABNORMAL HIGH (ref 3–23)

## 2020-03-25 LAB — D-DIMER, QUANTITATIVE: D-Dimer, Quant: 3.46 ug/mL-FEU — ABNORMAL HIGH (ref 0.00–0.50)

## 2020-03-25 LAB — HIV ANTIBODY (ROUTINE TESTING W REFLEX): HIV Screen 4th Generation wRfx: NONREACTIVE

## 2020-03-25 LAB — BRAIN NATRIURETIC PEPTIDE: B Natriuretic Peptide: 189 pg/mL — ABNORMAL HIGH (ref 0.0–100.0)

## 2020-03-25 LAB — PROCALCITONIN: Procalcitonin: 0.32 ng/mL

## 2020-03-25 LAB — LIPASE, BLOOD: Lipase: 26 U/L (ref 11–51)

## 2020-03-25 MED ORDER — SODIUM CHLORIDE 0.9 % IV SOLN
2.0000 g | Freq: Once | INTRAVENOUS | Status: AC
  Administered 2020-03-25: 2 g via INTRAVENOUS
  Filled 2020-03-25: qty 2

## 2020-03-25 MED ORDER — ONDANSETRON HCL 4 MG/2ML IJ SOLN
4.0000 mg | Freq: Once | INTRAMUSCULAR | Status: AC
  Administered 2020-03-25: 4 mg via INTRAVENOUS
  Filled 2020-03-25: qty 2

## 2020-03-25 MED ORDER — PANTOPRAZOLE SODIUM 40 MG IV SOLR
40.0000 mg | Freq: Every day | INTRAVENOUS | Status: DC
  Administered 2020-03-25 – 2020-03-27 (×3): 40 mg via INTRAVENOUS
  Filled 2020-03-25 (×3): qty 40

## 2020-03-25 MED ORDER — ACETAMINOPHEN 325 MG PO TABS
650.0000 mg | ORAL_TABLET | Freq: Four times a day (QID) | ORAL | Status: DC | PRN
  Administered 2020-03-25 – 2020-03-29 (×2): 650 mg via ORAL
  Filled 2020-03-25 (×2): qty 2

## 2020-03-25 MED ORDER — METHYLPREDNISOLONE SODIUM SUCC 40 MG IJ SOLR
0.5000 mg/kg | Freq: Two times a day (BID) | INTRAMUSCULAR | Status: DC
  Administered 2020-03-25 – 2020-03-26 (×3): 31.6 mg via INTRAVENOUS
  Filled 2020-03-25 (×3): qty 1

## 2020-03-25 MED ORDER — ZINC SULFATE 220 (50 ZN) MG PO CAPS
220.0000 mg | ORAL_CAPSULE | Freq: Every day | ORAL | Status: DC
  Administered 2020-03-25 – 2020-03-30 (×6): 220 mg via ORAL
  Filled 2020-03-25 (×6): qty 1

## 2020-03-25 MED ORDER — SODIUM CHLORIDE 0.9% FLUSH
10.0000 mL | Freq: Three times a day (TID) | INTRAVENOUS | Status: DC
  Administered 2020-03-25 – 2020-03-29 (×13): 10 mL

## 2020-03-25 MED ORDER — VANCOMYCIN HCL IN DEXTROSE 1-5 GM/200ML-% IV SOLN
1000.0000 mg | Freq: Once | INTRAVENOUS | Status: AC
  Administered 2020-03-25: 1000 mg via INTRAVENOUS
  Filled 2020-03-25: qty 200

## 2020-03-25 MED ORDER — TRAMADOL HCL 50 MG PO TABS
50.0000 mg | ORAL_TABLET | Freq: Four times a day (QID) | ORAL | Status: DC | PRN
  Administered 2020-03-25 – 2020-03-26 (×3): 50 mg via ORAL
  Filled 2020-03-25 (×3): qty 1

## 2020-03-25 MED ORDER — ALBUTEROL SULFATE HFA 108 (90 BASE) MCG/ACT IN AERS
2.0000 | INHALATION_SPRAY | Freq: Four times a day (QID) | RESPIRATORY_TRACT | Status: DC
  Administered 2020-03-25 – 2020-03-29 (×17): 2 via RESPIRATORY_TRACT
  Filled 2020-03-25 (×3): qty 6.7

## 2020-03-25 MED ORDER — LORAZEPAM 2 MG/ML IJ SOLN: 0.0000 mg | Freq: Four times a day (QID) | INTRAMUSCULAR | Status: DC

## 2020-03-25 MED ORDER — SODIUM CHLORIDE 0.9 % IV SOLN
2.0000 g | Freq: Three times a day (TID) | INTRAVENOUS | Status: DC
  Administered 2020-03-25 – 2020-03-26 (×3): 2 g via INTRAVENOUS
  Filled 2020-03-25 (×3): qty 2

## 2020-03-25 MED ORDER — LIDOCAINE HCL (PF) 1 % IJ SOLN
10.0000 mL | Freq: Once | INTRAMUSCULAR | Status: AC
  Administered 2020-03-25: 10 mL via INTRADERMAL
  Filled 2020-03-25: qty 10

## 2020-03-25 MED ORDER — DULOXETINE HCL 60 MG PO CPEP
60.0000 mg | ORAL_CAPSULE | Freq: Every day | ORAL | Status: DC
  Administered 2020-03-25 – 2020-03-30 (×6): 60 mg via ORAL
  Filled 2020-03-25 (×6): qty 1

## 2020-03-25 MED ORDER — RISPERIDONE 0.5 MG PO TABS
0.5000 mg | ORAL_TABLET | Freq: Every day | ORAL | Status: DC
  Administered 2020-03-25 – 2020-03-29 (×5): 0.5 mg via ORAL
  Filled 2020-03-25 (×5): qty 1

## 2020-03-25 MED ORDER — SODIUM CHLORIDE 0.9 % IV SOLN
100.0000 mg | INTRAVENOUS | Status: AC
  Administered 2020-03-25 (×2): 100 mg via INTRAVENOUS
  Filled 2020-03-25 (×2): qty 20

## 2020-03-25 MED ORDER — INSULIN ASPART 100 UNIT/ML ~~LOC~~ SOLN: 0.0000 [IU] | Freq: Every day | SUBCUTANEOUS | Status: DC

## 2020-03-25 MED ORDER — LORAZEPAM 1 MG PO TABS: 0.0000 mg | ORAL_TABLET | Freq: Four times a day (QID) | ORAL | Status: DC

## 2020-03-25 MED ORDER — INSULIN ASPART 100 UNIT/ML ~~LOC~~ SOLN
0.0000 [IU] | Freq: Three times a day (TID) | SUBCUTANEOUS | Status: DC
  Administered 2020-03-25: 2 [IU] via SUBCUTANEOUS
  Administered 2020-03-26 (×3): 3 [IU] via SUBCUTANEOUS
  Administered 2020-03-27 – 2020-03-28 (×3): 2 [IU] via SUBCUTANEOUS
  Administered 2020-03-29: 3 [IU] via SUBCUTANEOUS

## 2020-03-25 MED ORDER — GUAIFENESIN-DM 100-10 MG/5ML PO SYRP: 10.0000 mL | ORAL_SOLUTION | ORAL | Status: DC | PRN

## 2020-03-25 MED ORDER — ASCORBIC ACID 500 MG PO TABS
500.0000 mg | ORAL_TABLET | Freq: Every day | ORAL | Status: DC
  Administered 2020-03-25 – 2020-03-30 (×6): 500 mg via ORAL
  Filled 2020-03-25 (×6): qty 1

## 2020-03-25 MED ORDER — HYDROMORPHONE HCL 1 MG/ML IJ SOLN
1.0000 mg | Freq: Once | INTRAMUSCULAR | Status: AC
  Administered 2020-03-25: 1 mg via INTRAVENOUS
  Filled 2020-03-25: qty 1

## 2020-03-25 MED ORDER — LORAZEPAM 2 MG/ML IJ SOLN: 0.0000 mg | Freq: Two times a day (BID) | INTRAMUSCULAR | Status: DC

## 2020-03-25 MED ORDER — STERILE WATER FOR INJECTION IJ SOLN
5.0000 mg | Freq: Every day | RESPIRATORY_TRACT | Status: AC
  Administered 2020-03-25 – 2020-03-27 (×3): 5 mg via INTRAPLEURAL
  Filled 2020-03-25 (×3): qty 5

## 2020-03-25 MED ORDER — DEXAMETHASONE SODIUM PHOSPHATE 10 MG/ML IJ SOLN
10.0000 mg | Freq: Once | INTRAMUSCULAR | Status: AC
  Administered 2020-03-25: 10 mg via INTRAVENOUS
  Filled 2020-03-25: qty 1

## 2020-03-25 MED ORDER — SODIUM CHLORIDE 0.9 % IV SOLN
100.0000 mg | Freq: Every day | INTRAVENOUS | Status: AC
  Administered 2020-03-26 – 2020-03-27 (×2): 100 mg via INTRAVENOUS
  Filled 2020-03-25 (×3): qty 20

## 2020-03-25 MED ORDER — LORAZEPAM 1 MG PO TABS: 0.0000 mg | ORAL_TABLET | Freq: Two times a day (BID) | ORAL | Status: DC

## 2020-03-25 MED ORDER — VANCOMYCIN HCL IN DEXTROSE 1-5 GM/200ML-% IV SOLN
1000.0000 mg | Freq: Two times a day (BID) | INTRAVENOUS | Status: DC
  Administered 2020-03-25 – 2020-03-26 (×2): 1000 mg via INTRAVENOUS
  Filled 2020-03-25 (×3): qty 200

## 2020-03-25 MED ORDER — SODIUM CHLORIDE (PF) 0.9 % IJ SOLN
10.0000 mg | Freq: Every day | INTRAMUSCULAR | Status: AC
  Administered 2020-03-25 – 2020-03-27 (×3): 10 mg via INTRAPLEURAL
  Filled 2020-03-25 (×3): qty 10

## 2020-03-25 MED ORDER — PREDNISONE 5 MG PO TABS: 50.0000 mg | ORAL_TABLET | Freq: Every day | ORAL | Status: DC

## 2020-03-25 MED ORDER — THIAMINE HCL 100 MG/ML IJ SOLN
100.0000 mg | Freq: Every day | INTRAMUSCULAR | Status: DC
  Administered 2020-03-25: 100 mg via INTRAVENOUS
  Filled 2020-03-25 (×2): qty 2

## 2020-03-25 MED ORDER — TRAZODONE HCL 50 MG PO TABS
100.0000 mg | ORAL_TABLET | Freq: Every day | ORAL | Status: DC
  Administered 2020-03-25 – 2020-03-29 (×5): 100 mg via ORAL
  Filled 2020-03-25 (×5): qty 2

## 2020-03-25 MED ORDER — OXYCODONE HCL 5 MG PO TABS
5.0000 mg | ORAL_TABLET | Freq: Four times a day (QID) | ORAL | Status: DC | PRN
  Administered 2020-03-25 – 2020-03-26 (×3): 5 mg via ORAL
  Filled 2020-03-25 (×3): qty 1

## 2020-03-25 MED ORDER — THIAMINE HCL 100 MG PO TABS
100.0000 mg | ORAL_TABLET | Freq: Every day | ORAL | Status: DC
  Administered 2020-03-26 – 2020-03-30 (×5): 100 mg via ORAL
  Filled 2020-03-25 (×7): qty 1

## 2020-03-25 MED ORDER — IOHEXOL 350 MG/ML SOLN
100.0000 mL | Freq: Once | INTRAVENOUS | Status: AC | PRN
  Administered 2020-03-25: 100 mL via INTRAVENOUS

## 2020-03-25 NOTE — H&P (Signed)
History and Physical  Mike Moran MRN:[id number] DOB: 06/20/1955 DOA: 03/25/2020  Referring physician: Pollina, Christopher J, MD PCP: Golding, John, MD  Patient coming from: Home  Chief Complaint: Shortness of breath  HPI: Mike Moran is a 64 y.o. male with medical history significant for depression, alcohol abuse who presents to the emergency department due to 3-day onset of worsening shortness of breath.  He complained of cough, chest tightness, body aches and weakness since last 3 days, he states that he just returned from a trip to the beach.  EMS was activated and on arrival of EMS, patient was hypoxic and supplemental oxygen was provided and patient was taken to the ED for further evaluation and management.  ED Course:  In the emergency department, he was febrile, tachypneic, O2 sat was 93% on 3 LPM of oxygen.  Work-up in the ED showed leukocytosis, normocytic anemia, thrombocytosis, normal BMP except for mild hyperglycemia.  Procalcitonin 0.32, D-dimer 3.46, SARS coronavirus 2 was positive. CT angiography of chest showed large and loculated right pleural effusion with extensive right pulmonary collapse. No visible underlying tumor; empyema is the leading consideration. Chest x-ray showed large right pleural effusion and basilar atelectasis. He was treated with IV Decadron and remdesivir.  IV cefepime and vancomycin were given in the ED. Hospitalist was asked to admit patient for further evaluation and management.  Review of Systems: Constitutional: Negative for chills and fever.  HENT: Negative for ear pain and sore throat.   Eyes: Negative for pain and visual disturbance.  Respiratory: Positive for cough, chest tightness and shortness of breath.   Cardiovascular: Negative for chest pain and palpitations.  Gastrointestinal: Negative for abdominal pain and vomiting.  Endocrine: Negative for polyphagia and polyuria.  Genitourinary: Negative for decreased urine volume, dysuria,  enuresis Musculoskeletal: Negative for arthralgias and back pain.  Skin: Negative for color change and rash.  Allergic/Immunologic: Negative for immunocompromised state.  Neurological: Negative for tremors, syncope, speech difficulty Hematological: Does not bruise/bleed easily.  All other systems reviewed and are negative   Past Medical History:  Diagnosis Date  . Depression    History reviewed. No pertinent surgical history.  Social History:  reports that he has quit smoking. He has never used smokeless tobacco. He reports current alcohol use. He reports that he does not use drugs.   Allergies  Allergen Reactions  . Codeine Nausea And Vomiting  . Lexapro [Escitalopram Oxalate] Other (See Comments)    lethargic    History reviewed. No pertinent family history.   Prior to Admission medications   Medication Sig Start Date End Date Taking? Authorizing Provider  aspirin EC 81 MG tablet Take 1 tablet (81 mg total) by mouth daily. For heart health 08/04/15   Nwoko, Agnes I, NP  doxycycline (VIBRAMYCIN) 100 MG capsule Take 1 capsule (100 mg total) by mouth 2 (two) times daily. 05/20/19   Triplett, Tammy, PA-C  DULoxetine (CYMBALTA) 60 MG capsule Take 1 capsule (60 mg total) by mouth daily. 02/29/20   Ross, Deborah R, MD  hydrOXYzine (ATARAX/VISTARIL) 25 MG tablet Take 1 tablet (25 mg total) by mouth every 6 (six) hours as needed. for anxiety 02/29/20   Ross, Deborah R, MD  risperiDONE (RISPERDAL) 0.5 MG tablet TAKE (1) TABLET BY MOUTH AT BEDTIME. 02/29/20   Ross, Deborah R, MD  traZODone (DESYREL) 50 MG tablet Take 1 tablet (50 mg total) by mouth at bedtime as needed and may repeat dose one time if needed for sleep. 02/29/20   Ross,   Su Ley, MD  triamcinolone cream (KENALOG) 0.1 % Apply 1 application topically 3 (three) times daily. To the affected area 05/20/19   Kem Parkinson, PA-C    Physical Exam: BP 97/65   Pulse 84   Temp (!) 100.6 F (38.1 C) (Oral)   Resp 20   Ht 6' (1.829  m)   Wt 63.5 kg   SpO2 95%   BMI 18.99 kg/m   . General: 65 y.o. year-old male well developed well nourished in no acute distress.  Alert and oriented x3. Marland Kitchen HEENT: NCAT, EOMI . Neck: Supple, trachea medial . Cardiovascular: Regular rate and rhythm with no rubs or gallops.  No thyromegaly or JVD noted. 2/4 pulses in all 4 extremities. Marland Kitchen Respiratory: Tachypnea.  Decreased breath sounds and  rales.   . Abdomen: Soft nontender nondistended with normal bowel sounds x4 quadrants. . Muskuloskeletal: No cyanosis, clubbing or edema noted bilaterally . Neuro: CN II-XII intact, sensation, reflexes intact . Skin: No ulcerative lesions noted or rashes . Psychiatry: Judgement and insight appear normal. Mood is appropriate for condition and setting          Labs on Admission:  Basic Metabolic Panel: Recent Labs  Lab 03/25/20 0205  NA 135  K 3.6  CL 101  CO2 26  GLUCOSE 133*  BUN 20  CREATININE 0.65  CALCIUM 8.9   Liver Function Tests: Recent Labs  Lab 03/25/20 0205  AST 16  ALT 14  ALKPHOS 50  BILITOT 0.5  PROT 6.7  ALBUMIN 3.1*   Recent Labs  Lab 03/25/20 0205  LIPASE 26   No results for input(s): AMMONIA in the last 168 hours. CBC: Recent Labs  Lab 03/25/20 0205  WBC 29.5*  NEUTROABS 27.5*  HGB 11.6*  HCT 34.5*  MCV 91.0  PLT 467*   Cardiac Enzymes: No results for input(s): CKTOTAL, CKMB, CKMBINDEX, TROPONINI in the last 168 hours.  BNP (last 3 results) Recent Labs    03/25/20 0205  BNP 189.0*    ProBNP (last 3 results) No results for input(s): PROBNP in the last 8760 hours.  CBG: No results for input(s): GLUCAP in the last 168 hours.  Radiological Exams on Admission: CT ANGIO CHEST PE W OR WO CONTRAST  Result Date: 03/25/2020 CLINICAL DATA:  Shortness of breath. EXAM: CT ANGIOGRAPHY CHEST WITH CONTRAST TECHNIQUE: Multidetector CT imaging of the chest was performed using the standard protocol during bolus administration of intravenous contrast.  Multiplanar CT image reconstructions and MIPs were obtained to evaluate the vascular anatomy. CONTRAST:  159m OMNIPAQUE IOHEXOL 350 MG/ML SOLN COMPARISON:  None. FINDINGS: Cardiovascular: Satisfactory opacification of the pulmonary arteries to the segmental level. No evidence of pulmonary embolism. Normal heart size. No pericardial effusion. Atheromatous calcification of the aorta Mediastinum/Nodes: No visible adenopathy or mass. Lungs/Pleura: Large right pleural effusion with loculation causing scalloping of the lung. The apicomedial opacity by radiography is also loculated pleural fluid. No visible mass. Extensive atelectasis in the right lung with collapsed lower and middle lobes. Secretions/debris seen in the central right-sided airways. Mild left-sided atelectasis. Tiny left apical pulmonary nodule measuring 3 mm, which can be re-evaluated at follow-up. Upper Abdomen: Right adrenal mass which is partially covered at 5.6 cm with low-density appearance and coarse calcifications. A right suprarenal mass of similar size was described on an abdominal ultrasound April 21, 2008. Musculoskeletal: No bony erosion adjacent to the pleural fluid. Review of the MIP images confirms the above findings. IMPRESSION: 1. Large and loculated right pleural effusion with  extensive right pulmonary collapse. No visible underlying tumor; empyema is the leading consideration. 2. Partially covered 5.6 cm right adrenal mass, chronic and benign based on a 2010 abdominal ultrasound. Electronically Signed   By: Jonathon  Watts M.D.   On: 03/25/2020 04:20   DG Chest Port 1 View  Result Date: 03/25/2020 CLINICAL DATA:  Cough and shortness of breath EXAM: PORTABLE CHEST 1 VIEW COMPARISON:  01/09/2014 FINDINGS: Large right pleural effusion. Basilar atelectasis. Left lung is clear. Medial opacity at the right lung apex. IMPRESSION: 1. Large right pleural effusion and basilar atelectasis. 2. Medial opacity at the right lung apex. CT  recommended for better characterization. Electronically Signed   By: Kevin  Herman M.D.   On: 03/25/2020 02:13    EKG: I independently viewed the EKG done and my findings are as followed: Normal sinus rhythm at a rate of 91 bpm with APCs  Assessment/Plan Present on Admission: **None**  Principal Problem:   Pneumonia due to COVID-19 virus Active Problems:   Respiratory failure with hypoxia (HCC)   Pleural effusion on right   Sepsis (HCC)   Leukocytosis   Normocytic anemia   Thrombocytosis   Pulmonary nodule   Alcohol abuse   Elevated brain natriuretic peptide (BNP) level  Acute respiratory failure with hypoxia secondary to sepsis due to COVID-19 virus pneumonia superimposed with loculated right pleural effusion  CT angiography of chest showed large and loculated right pleural effusion with extensive right pulmonary collapse. Chest x-ray showed large right pleural effusion and basilar atelectasis. Patient was febrile, tachypneic and have elevated WBC (met SIRS criteria).  Procalcitonin was 0.32, lactic acid was 0.6 He was started on IV cefepime and vancomycin, we shall continue with same at this time. Thoracentesis and fluid analysis will be done to rule out empyema Continue albuterol q.6h Continue IV Solu-Medrol per pharmacy dosing Continue IV Remdesivir per pharmacy protocol Continue vitamin-C 500 mg p.o. Daily Continue zinc 220 mg p.o. Daily Continue Mucinex, Robitussin Continue Tylenol p.r.n. for fever Continue supplemental oxygen to maintain O2 sat > or = 94% with plan to wean patient off supplemental oxygen as tolerated (of note, patient does not use oxygen at baseline) Continue incentive spirometry and flutter valve q15min as tolerated Encourage proning, early ambulation, and side laying as tolerated Continue airborne isolation precaution Inflammatory markers: LDH:99, CRP: 19.7, D-dimer: 3.46, Ferritin: 225, fibrinogen 736 Continue monitoring daily inflammatory  markers Physician PPE:  Surgical mask with face shield, N-95, nonsterile gloves, disposable gown, head and shoe covers Patient PPE:  Face mask   Leukocytosis WBC 29.5, continue treatment as per above  Thrombocytosis possibly reactive Platelets 467; continue to monitor platelet levels pneumonia labs  Elevated BNP BNP 189, patient has no JVD, orthopnea, peripheral edema Continue total input/output, daily weights and fluid restriction Continue Cardiac diet   History of alcohol abuse Alcohol level < 10 Patient was placed on CIWA protocol in the ED Continue fall precaution and neurochecks  Pulmonary nodule Incidental finding of tiny left apical pulmonary nodule measuring 3 mm was noted Reevaluation at follow-up recommended    DVT prophylaxis: SCDs (no chemoprophylaxis at this time due to anticipation for possible thoracentesis)  Code Status: Full code  Family Communication: None at bedside  Disposition Plan:  Patient is from:                        home Anticipated DC to:                     SNF or family members home Anticipated DC date:               2-3 days Anticipated DC barriers:           Patient requires inpatient management due to hypoxia secondary to sepsis secondary to loculated right pleural effusion in the setting of COVID-19 virus pneumonia  Consults called: None  Admission status: Inpatient    Bernadette Hoit MD [facility]  03/25/2020, 5:41 AM

## 2020-03-25 NOTE — Progress Notes (Addendum)
Pharmacy Antibiotic Note  Mike Moran is a 65 y.o. male admitted on 03/25/2020 with pneumonia.  Pharmacy has been consulted for Vancomycin dosing. WBC is elevated. Renal function good.   Plan: Vancomycin 1000 mg IV q12h >>Estimated AUC: 506 Cefepime 2g IV q8h Trend WBC, temp, renal function  F/U infectious work-up Drug levels as indicated   Height: 6' (182.9 cm) Weight: 63.5 kg (140 lb) IBW/kg (Calculated) : 77.6  Temp (24hrs), Avg:100.6 F (38.1 C), Min:100.6 F (38.1 C), Max:100.6 F (38.1 C)  Recent Labs  Lab 03/25/20 0205 03/25/20 0430  WBC 29.5*  --   CREATININE 0.65  --   LATICACIDVEN 1.7 0.6    Estimated Creatinine Clearance: 81.6 mL/min (by C-G formula based on SCr of 0.65 mg/dL).    Allergies  Allergen Reactions  . Codeine Nausea And Vomiting  . Lexapro [Escitalopram Oxalate] Other (See Comments)    lethargic   [name], PharmD, BCPS Clinical Pharmacist Phone: [phone]

## 2020-03-25 NOTE — Progress Notes (Signed)
Pt arrived on unit. AOx4. VSS. Belongings noted in chart. Call bell within reach. Bed alarm on. Will continue to monitor.

## 2020-03-25 NOTE — Procedures (Signed)
Insertion of Chest Tube Procedure Note  KELLY EISLER  [id number]  [birth year]  Date:03/25/20  Time:12:45 PM    Provider Performing: Dellia Cloud   Procedure: Chest Tube Insertion (32551)  Indication(s) Effusion (loculated pleural effusion)  Consent Risks of the procedure as well as the alternatives and risks of each were explained to the patient and/or caregiver.  Consent for the procedure was obtained and is signed in the bedside chart  Anesthesia Topical only with 1% lidocaine    Time Out Verified patient identification, verified procedure, site/side was marked, verified correct patient position, special equipment/implants available, medications/allergies/relevant history reviewed, required imaging and test results available.   Sterile Technique Maximal sterile technique including full sterile barrier drape, hand hygiene, sterile gown, sterile gloves, mask, hair covering, sterile ultrasound probe cover (if used).   Procedure Description Ultrasound used to identify appropriate pleural anatomy for placement and overlying skin marked. Area of placement cleaned and draped in sterile fashion.  A 24 French chest tube was placed into the right pleural space using Seldinger technique. Appropriate return of fluid was obtained.  The tube was connected to atrium and placed on -20 cm H2O wall suction.   Complications/Tolerance None; patient tolerated the procedure well. Chest X-ray is ordered to verify placement.   EBL Minimal  Specimen(s) fluid (750 cc)  Chari Manning, D.O.  Internal Medicine Resident, PGY-2 [facility]  Pager: [phone] 12:49 PM, 03/25/2020

## 2020-03-25 NOTE — Progress Notes (Signed)
Patient is on telemetry, he was noted to have few beats of second-degree heart block, Mobitz 2, where he did progress to total of 5 beats of P waves, with no conduction, not followed by QRS, patient was asymptomatic during that episode, he is not on beta-blockers, or calcium channel blockers, repeat EKG was obtained, I have requested cardiology to evaluate. Huey Bienenstock MD

## 2020-03-25 NOTE — ED Notes (Signed)
Pt reports left sided chest pain. Dr. Mariea Clonts notified. Pt alert and oriented at this time. Tylenol given for fever. Urinal at bedside. Oriented to call bell and bed in low position and locked. No other needs at this time.

## 2020-03-25 NOTE — ED Provider Notes (Signed)
[facility] EMERGENCY DEPARTMENT Provider Note   CSN: [id number] Arrival date & time: 03/25/20  0134     History Chief Complaint  Patient presents with  . Shortness of Breath    Mike Moran is a 65 y.o. male.  Patient reports that he has been experiencing cough, chest tightness and burning in his lungs for the last 3 days.  He just got back from a trip to the beach.  Patient noted to be short of breath at arrival, requiring oxygen.  He does not use oxygen at home.        Past Medical History:  Diagnosis Date  . Depression     Patient Active Problem List   Diagnosis Date Noted  . COVID-19 virus infection 03/25/2020  . Severe episode of recurrent major depressive disorder, without psychotic features (HCC)   . GAD (generalized anxiety disorder)     History reviewed. No pertinent surgical history.     History reviewed. No pertinent family history.  Social History   Tobacco Use  . Smoking status: Former Games developer  . Smokeless tobacco: Never Used  Substance Use Topics  . Alcohol use: Yes    Alcohol/week: 0.0 standard drinks    Comment: occ- pt denies SA use  . Drug use: No    Home Medications Prior to Admission medications   Medication Sig Start Date End Date Taking? Authorizing Provider  aspirin EC 81 MG tablet Take 1 tablet (81 mg total) by mouth daily. For heart health 08/04/15   Armandina Stammer I, NP  doxycycline (VIBRAMYCIN) 100 MG capsule Take 1 capsule (100 mg total) by mouth 2 (two) times daily. 05/20/19   Triplett, Tammy, PA-C  DULoxetine (CYMBALTA) 60 MG capsule Take 1 capsule (60 mg total) by mouth daily. 02/29/20   Myrlene Broker, MD  hydrOXYzine (ATARAX/VISTARIL) 25 MG tablet Take 1 tablet (25 mg total) by mouth every 6 (six) hours as needed. for anxiety 02/29/20   Myrlene Broker, MD  risperiDONE (RISPERDAL) 0.5 MG tablet TAKE (1) TABLET BY MOUTH AT BEDTIME. 02/29/20   Myrlene Broker, MD  traZODone (DESYREL) 50 MG tablet Take 1 tablet (50 mg total) by  mouth at bedtime as needed and may repeat dose one time if needed for sleep. 02/29/20   Myrlene Broker, MD  triamcinolone cream (KENALOG) 0.1 % Apply 1 application topically 3 (three) times daily. To the affected area 05/20/19   Triplett, Tammy, PA-C    Allergies    Codeine and Lexapro [escitalopram oxalate]  Review of Systems   Review of Systems  Respiratory: Positive for cough, chest tightness and shortness of breath.   All other systems reviewed and are negative.   Physical Exam Updated Vital Signs BP 100/67   Pulse 87   Temp (!) 100.6 F (38.1 C) (Oral)   Resp (!) 23   Ht 6' (1.829 m)   Wt 63.5 kg   SpO2 96%   BMI 18.99 kg/m   Physical Exam Vitals and nursing note reviewed.  Constitutional:      General: He is not in acute distress.    Appearance: Normal appearance. He is well-developed and well-nourished.  HENT:     Head: Normocephalic and atraumatic.     Right Ear: Hearing normal.     Left Ear: Hearing normal.     Nose: Nose normal.     Mouth/Throat:     Mouth: Oropharynx is clear and moist and mucous membranes are normal.  Eyes:  Extraocular Movements: EOM normal.     Conjunctiva/sclera: Conjunctivae normal.     Pupils: Pupils are equal, round, and reactive to light.  Cardiovascular:     Rate and Rhythm: Regular rhythm.     Heart sounds: S1 normal and S2 normal. No murmur heard. No friction rub. No gallop.   Pulmonary:     Effort: Tachypnea and accessory muscle usage present.     Breath sounds: Decreased breath sounds and rales present.  Chest:     Chest wall: No tenderness.  Abdominal:     General: Bowel sounds are normal.     Palpations: Abdomen is soft. There is no hepatosplenomegaly.     Tenderness: There is no abdominal tenderness. There is no guarding or rebound. Negative signs include Murphy's sign and McBurney's sign.     Hernia: No hernia is present.  Musculoskeletal:        General: Normal range of motion.     Cervical back: Normal range of  motion and neck supple.  Skin:    General: Skin is warm, dry and intact.     Findings: No rash.     Nails: There is no cyanosis.  Neurological:     Mental Status: He is alert and oriented to person, place, and time.     GCS: GCS eye subscore is 4. GCS verbal subscore is 5. GCS motor subscore is 6.     Cranial Nerves: No cranial nerve deficit.     Sensory: No sensory deficit.     Coordination: Coordination normal.     Deep Tendon Reflexes: Strength normal.  Psychiatric:        Mood and Affect: Mood and affect normal.        Speech: Speech normal.        Behavior: Behavior normal.        Thought Content: Thought content normal.     ED Results / Procedures / Treatments   Labs (all labs ordered are listed, but only abnormal results are displayed) Labs Reviewed  RESP PANEL BY RT-PCR (FLU A&B, COVID) ARPGX2 - Abnormal; Notable for the following components:      Result Value   SARS Coronavirus 2 by RT PCR POSITIVE (*)    All other components within normal limits  CBC WITH DIFFERENTIAL/PLATELET - Abnormal; Notable for the following components:   WBC 29.5 (*)    RBC 3.79 (*)    Hemoglobin 11.6 (*)    HCT 34.5 (*)    Platelets 467 (*)    Neutro Abs 27.5 (*)    Lymphs Abs 0.5 (*)    Monocytes Absolute 1.3 (*)    Abs Immature Granulocytes 0.20 (*)    All other components within normal limits  BRAIN NATRIURETIC PEPTIDE - Abnormal; Notable for the following components:   B Natriuretic Peptide 189.0 (*)    All other components within normal limits  COMPREHENSIVE METABOLIC PANEL - Abnormal; Notable for the following components:   Glucose, Bld 133 (*)    Albumin 3.1 (*)    All other components within normal limits  D-DIMER, QUANTITATIVE (NOT [facility]) - Abnormal; Notable for the following components:   D-Dimer, Quant 3.46 (*)    All other components within normal limits  FIBRINOGEN - Abnormal; Notable for the following components:   Fibrinogen 736 (*)    All other components within  normal limits  C-REACTIVE PROTEIN - Abnormal; Notable for the following components:   CRP 19.7 (*)    All other components  within normal limits  CULTURE, BLOOD (ROUTINE X 2)  CULTURE, BLOOD (ROUTINE X 2)  LIPASE, BLOOD  ETHANOL  LACTIC ACID, PLASMA  PROCALCITONIN  LACTATE DEHYDROGENASE  FERRITIN  TRIGLYCERIDES  LACTIC ACID, PLASMA  POC SARS CORONAVIRUS 2 AG -  ED  TROPONIN I (HIGH SENSITIVITY)  TROPONIN I (HIGH SENSITIVITY)    EKG EKG Interpretation  Date/Time:  Tuesday March 25 2020 01:44:46 EST Ventricular Rate:  91 PR Interval:    QRS Duration: 97 QT Interval:  333 QTC Calculation: 410 R Axis:   28 Text Interpretation: Sinus rhythm Atrial premature complexes Borderline low voltage, extremity leads Confirmed by Gilda Crease [phone]) on 03/25/2020 1:48:22 AM   Radiology CT ANGIO CHEST PE W OR WO CONTRAST  Result Date: 03/25/2020 CLINICAL DATA:  Shortness of breath. EXAM: CT ANGIOGRAPHY CHEST WITH CONTRAST TECHNIQUE: Multidetector CT imaging of the chest was performed using the standard protocol during bolus administration of intravenous contrast. Multiplanar CT image reconstructions and MIPs were obtained to evaluate the vascular anatomy. CONTRAST:  OMNIPAQUE IOHEXOL 350 MG/ML SOLN COMPARISON:  None. FINDINGS: Cardiovascular: Satisfactory opacification of the pulmonary arteries to the segmental level. No evidence of pulmonary embolism. Normal heart size. No pericardial effusion. Atheromatous calcification of the aorta Mediastinum/Nodes: No visible adenopathy or mass. Lungs/Pleura: Large right pleural effusion with loculation causing scalloping of the lung. The apicomedial opacity by radiography is also loculated pleural fluid. No visible mass. Extensive atelectasis in the right lung with collapsed lower and middle lobes. Secretions/debris seen in the central right-sided airways. Mild left-sided atelectasis. Tiny left apical pulmonary nodule measuring 3 mm, which  can be re-evaluated at follow-up. Upper Abdomen: Right adrenal mass which is partially covered at 5.6 cm with low-density appearance and coarse calcifications. A right suprarenal mass of similar size was described on an abdominal ultrasound April 21, 2008. Musculoskeletal: No bony erosion adjacent to the pleural fluid. Review of the MIP images confirms the above findings. IMPRESSION: 1. Large and loculated right pleural effusion with extensive right pulmonary collapse. No visible underlying tumor; empyema is the leading consideration. 2. Partially covered 5.6 cm right adrenal mass, chronic and benign based on a 2010 abdominal ultrasound. Electronically Signed   By: Marnee Spring M.D.   On: 03/25/2020 04:20   DG Chest Port 1 View  Result Date: 03/25/2020 CLINICAL DATA:  Cough and shortness of breath EXAM: PORTABLE CHEST 1 VIEW COMPARISON:  01/09/2014 FINDINGS: Large right pleural effusion. Basilar atelectasis. Left lung is clear. Medial opacity at the right lung apex. IMPRESSION: 1. Large right pleural effusion and basilar atelectasis. 2. Medial opacity at the right lung apex. CT recommended for better characterization. Electronically Signed   By: Deatra Robinson M.D.   On: 03/25/2020 02:13    Procedures Procedures   Medications Ordered in ED Medications  LORazepam (ATIVAN) injection 0-4 mg (0 mg Intravenous Not Given 03/25/20 0219)    Or  LORazepam (ATIVAN) tablet 0-4 mg ( Oral See Alternative 03/25/20 0219)  LORazepam (ATIVAN) injection 0-4 mg (has no administration in time range)    Or  LORazepam (ATIVAN) tablet 0-4 mg (has no administration in time range)  thiamine tablet 100 mg (has no administration in time range)    Or  thiamine (B-1) injection 100 mg (has no administration in time range)  remdesivir 100 mg in sodium chloride 0.9 % 100 mL IVPB (100 mg Intravenous New Bag/Given 03/25/20 0443)  remdesivir 100 mg in sodium chloride 0.9 % 100 mL IVPB (has no administration in  time range)   ceFEPIme (MAXIPIME) 2 g in sodium chloride 0.9 % 100 mL IVPB (has no administration in time range)  vancomycin (VANCOCIN) IVPB 1000 mg/200 mL premix (has no administration in time range)  HYDROmorphone (DILAUDID) injection 1 mg (1 mg Intravenous Given 03/25/20 0347)  ondansetron (ZOFRAN) injection 4 mg (4 mg Intravenous Given 03/25/20 0346)  iohexol (OMNIPAQUE) 350 MG/ML injection 100 mL (100 mLs Intravenous Contrast Given 03/25/20 0353)  dexamethasone (DECADRON) injection 10 mg (10 mg Intravenous Given 03/25/20 0347)    ED Course  I have reviewed the triage vital signs and the nursing notes.  Pertinent labs & imaging results that were available during my care of the patient were reviewed by me and considered in my medical decision making (see chart for details).    MDM Rules/Calculators/A&P                          Patient presents to the emergency department for evaluation of cough and shortness of breath.  Patient noted to have a low-grade fever on arrival.  His symptoms have been present for 3 days.  He is unvaccinated for COVID.  Patient noted to be tachypneic and hypoxic on arrival.  Covid was suspected to be the most likely cause of the patient's symptoms.  His Covid test is positive.  He was initiated on Decadron and remdesivir.  Patient did, however, have a large pleural effusion on his chest x-ray.  Additionally he had a significant leukocytosis of mild elevation of procalcitonin.  Lactic acid, however is negative.  Patient underwent CT to further evaluate the effusion.  This is a loculated effusion, possibly empyema.  No underlying lung parenchymal lesions or tumors noted but lung is largely collapsed.  Will initiate antibiotic coverage as well.  As empyema is in the differential diagnosis, recommendation is for [facility] where he can have access to interventional radiology and cardiothoracic surgery if necessary.  CRITICAL CARE Performed by: Gilda Crease   Total critical care time: 35 minutes  Critical care time was exclusive of separately billable procedures and treating other patients.  Critical care was necessary to treat or prevent imminent or life-threatening deterioration.  Critical care was time spent personally by me on the following activities: development of treatment plan with patient and/or surrogate as well as nursing, discussions with consultants, evaluation of patient's response to treatment, examination of patient, obtaining history from patient or surrogate, ordering and performing treatments and interventions, ordering and review of laboratory studies, ordering and review of radiographic studies, pulse oximetry and re-evaluation of patient's condition.  Final Clinical Impression(s) / ED Diagnoses Final diagnoses:  COVID-19  Pleural effusion  Acute respiratory failure with hypoxia [facility])    Rx / DC Orders ED Discharge Orders    None       Pollina, Canary Brim, MD 03/25/20 [phone]

## 2020-03-25 NOTE — Consult Note (Signed)
NAME:  Mike Moran, MRN:  [id number], DOB:  February 19, 1955, LOS: 0 ADMISSION DATE:  03/25/2020, CONSULTATION DATE:  03/25/20 REFERRING MD:  Elgergawy, CHIEF COMPLAINT: loculated pleural effusion    Brief History:  65 yo M + COVID, loculated pleural effusion.   History of Present Illness:  65 yo M PMH Anxiety, Depression, EtOH use presented to [facility] 2/15 with SOB. SOB, cough, back pain on the right began 3 days prior to presentation. Associated body aches and weakness. Recently traveled to beach. Pt noted to be hypoxic by EMS and was taken to ED on O2. Tested positive for COVID. CTA chest obtained which revealed a large r pleural effusion, loculated in appearance. Transferred to [facility] 2/15. CRP 19.7 LDH 99 Ferritin 225 PCT 0.32 WBC 29.5.  PCCM consulted for management of loculated pleural effusion.  Past Medical History:  Depression  Anxiety   Significant Hospital Events:  2/15 presented to [facility] for SOB, hypoxia. COVID +. CTA reveals large pleural effusion. Transferred to [facility]. PCCM consulted for pleural effusion   Consults:  PCCM   Procedures:  2/15 Chest Tube >>>   Significant Diagnostic Tests:  2/15 CTA chest > no evidence of PE. Large R pleural effusion with loculation. RML RLL collapse, significant atelectasis. R airway debris. Small 53mm L apical nodule. 5.6cm R adrenal mass (seen on 2010 abdominal ultrasound)   Micro Data:  2/15 COVID +  2/15 BCx>>   Antimicrobials:  2/15 vanc> 2/15 cefepime>  2/15 remdesivir>  Interim History / Subjective:    Objective   Blood pressure 94/65, pulse 74, temperature 97.8 F (36.6 C), temperature source Oral, resp. rate 14, height 6' (1.829 m), weight 63.5 kg, SpO2 95 %.        Intake/Output Summary (Last 24 hours) at 03/25/2020 1319 Last data filed at 03/25/2020 1200 Gross per 24 hour  Intake 400 ml  Output 800 ml  Net -400 ml   Filed Weights   03/25/20 0141  Weight: 63.5 kg    Examination: General: chronically ill appearing,  thin man laying in bed in NAD HENT: [place]/AT, eyes anicteric. Temporal wasting,  Lungs: reduced basilar right breath sounds Cardiovascular: RRR, no murmurs Abdomen: thin, soft Extremities: no peripheral edema, no cyanosis Neuro: awake and alert, answering questions appropriately Derm: warm, dry   Resolved Hospital Problem list     Assessment & Plan:   Acute respiratory failure with hypoxia Large R sided loculated pleural effusion COVID-19 infection P -place chest tube, maintain suction to -20cm H20 -after follow up CXR, will need TPA & Dnase -culture, fluid chemistries, cell count with diff  -empiric abx -- on vanc, cefepime  -airborne/contact precautions for covid -covid-19 therapies per primary team. Currently on remdesivir, steroids, Vit C, Zinc  -trending inflammatory markers -Supplemental O2 as needed, IS -oxycodone 5mg  Q6h PRN for pain control while chest tube remains in place  Depression Anxiety Sleep Disturbance -takes cymbalta, atarax, risperidol, trazodone at home  Best practice (evaluated daily)  Diet: NPO Pain/Anxiety/Delirium protocol (if indicated):  VAP protocol (if indicated):  DVT prophylaxis: SCDs GI prophylaxis: na Glucose control: SSI Mobility: per primary Disposition:med surg  Goals of Care:  Last date of multidisciplinary goals of care discussion: Per primary Code Status: Full   Labs   CBC: Recent Labs  Lab 03/25/20 0205  WBC 29.5*  NEUTROABS 27.5*  HGB 11.6*  HCT 34.5*  MCV 91.0  PLT 467*    Basic Metabolic Panel: Recent Labs  Lab 03/25/20 0205  NA 135  K 3.6  CL 101  CO2 26  GLUCOSE 133*  BUN 20  CREATININE 0.65  CALCIUM 8.9   GFR: Estimated Creatinine Clearance: 81.6 mL/min (by C-G formula based on SCr of 0.65 mg/dL). Recent Labs  Lab 03/25/20 0205 03/25/20 0430  PROCALCITON 0.32  --   WBC 29.5*  --   LATICACIDVEN 1.7 0.6    Liver Function Tests: Recent Labs  Lab 03/25/20 0205  AST 16  ALT 14  ALKPHOS 50   BILITOT 0.5  PROT 6.7  ALBUMIN 3.1*   Recent Labs  Lab 03/25/20 0205  LIPASE 26   No results for input(s): AMMONIA in the last 168 hours.  ABG No results found for: PHART, PCO2ART, PO2ART, HCO3, TCO2, ACIDBASEDEF, O2SAT   Coagulation Profile: No results for input(s): INR, PROTIME in the last 168 hours.  Cardiac Enzymes: No results for input(s): CKTOTAL, CKMB, CKMBINDEX, TROPONINI in the last 168 hours.  HbA1C: Hgb A1c MFr Bld  Date/Time Value Ref Range Status  01/10/2014 06:19 AM 5.6 <5.7 % Final    Comment:    (NOTE)                                                                       According to the ADA Clinical Practice Recommendations for 2011, when HbA1c is used as a screening test:  >=6.5%   Diagnostic of Diabetes Mellitus           (if abnormal result is confirmed) 5.7-6.4%   Increased risk of developing Diabetes Mellitus References:Diagnosis and Classification of Diabetes Mellitus,Diabetes Care,2011,34(Suppl 1):S62-S69 and Standards of Medical Care in         Diabetes - 2011,Diabetes Care,2011,34 (Suppl 1):S11-S61.     CBG: Recent Labs  Lab 03/25/20 1241  GLUCAP 135*    Review of Systems:   Review of Systems  Constitutional: Negative for chills and weight loss.  Respiratory: Positive for cough, sputum production and shortness of breath.   Cardiovascular: Positive for chest pain. Negative for leg swelling.  Gastrointestinal: Positive for nausea.  Musculoskeletal: Negative for joint pain and myalgias.  Skin: Negative for rash.  Neurological: Negative for focal weakness.     Past Medical History:  He,  has a past medical history of Anxiety and Depression.   Surgical History:  History reviewed. No pertinent surgical history.   Social History:   reports that he has quit smoking. He has never used smokeless tobacco. He reports current alcohol use. He reports that he does not use drugs.   Family History:  His family history is not on file.    Allergies Allergies  Allergen Reactions  . Codeine Nausea And Vomiting  . Lexapro [Escitalopram Oxalate] Other (See Comments)    lethargic     Home Medications  Prior to Admission medications   Medication Sig Start Date End Date Taking? Authorizing Provider  aspirin EC 81 MG tablet Take 1 tablet (81 mg total) by mouth daily. For heart health 08/04/15  Yes Armandina Stammer I, NP  DULoxetine (CYMBALTA) 60 MG capsule Take 1 capsule (60 mg total) by mouth daily. 02/29/20  Yes Myrlene Broker, MD  hydrOXYzine (ATARAX/VISTARIL) 25 MG tablet Take 1 tablet (25 mg total) by mouth every 6 (six) hours as  needed. for anxiety 02/29/20  Yes Myrlene Broker, MD  Multiple Vitamins-Minerals (MULTIVITAMIN WITH MINERALS) tablet Take 1 tablet by mouth daily.   Yes [provider]  risperiDONE (RISPERDAL) 0.5 MG tablet TAKE (1) TABLET BY MOUTH AT BEDTIME. 02/29/20  Yes Myrlene Broker, MD  traZODone (DESYREL) 50 MG tablet Take 1 tablet (50 mg total) by mouth at bedtime as needed and may repeat dose one time if needed for sleep. 02/29/20  Yes Myrlene Broker, MD  doxycycline (VIBRAMYCIN) 100 MG capsule Take 1 capsule (100 mg total) by mouth 2 (two) times daily. Patient not taking: Reported on 03/25/2020 05/20/19   Pauline Aus, PA-C  triamcinolone cream (KENALOG) 0.1 % Apply 1 application topically 3 (three) times daily. To the affected area Patient not taking: Reported on 03/25/2020 05/20/19   Pauline Aus, PA-C     Steffanie Dunn, DO 03/25/20 1:19 PM [facility]  From 7AM- 7PM if no response to pager, please call [phone]. After hours, 7PM- 7AM, please call Elink  [phone].

## 2020-03-25 NOTE — ED Notes (Signed)
Pt's sleeping

## 2020-03-25 NOTE — ED Notes (Signed)
Attempted to call report to [facility] x 2. On hold 15 minutes. Provided [facility] number for nurse from [facility] to call and get report.

## 2020-03-25 NOTE — Progress Notes (Signed)
Assisted Dr Chestine Spore with right chest tube placement.  Patient tolerated well.  CT placed to 20 cm suction.

## 2020-03-25 NOTE — Procedures (Addendum)
Pleural Fibrinolytic Administration Procedure Note  Mike Moran  [id number]  02-06-1956  Date:03/25/20  Time:3:12 PM   Provider Performing:Mike Moran   Procedure: Pleural Fibrinolysis Initial day (23536)  Indication(s) Fibrinolysis of complicated right sided pleural effusion  Consent Risks of the procedure as well as the alternatives and risks of each were explained to the patient and/or caregiver.  Consent for the procedure was obtained.  Anesthesia None   Time Out Verified patient identification, verified procedure, site/side was marked, verified correct patient position, special equipment/implants available, medications/allergies/relevant history reviewed, required imaging and test results available.   Sterile Technique Hand hygiene, gloves   Procedure Description Existing pleural catheter was cleaned and accessed in sterile manner.  10mg  of tPA in 30cc of saline and 5mg  of dornase in 30cc of sterile water were injected into pleural space using existing pleural catheter.  Catheter will be clamped for 1 hour and then placed back to suction. Patient to lay on one side for 30 minutes, rotate to other side for 30 minutes during the 1 hour catheter clamping.  Discussed with patient and nurse.   Complications/Tolerance None; patient tolerated the procedure well.  EBL None  Specimen(s) None   MSN, AGACNP-BC [facility] Pulmonary/Critical Care Medicine Mike Moran If no answer, [facility] 03/25/2020, 3:12 PM

## 2020-03-25 NOTE — Consult Note (Signed)
Cardiology Consultation:  Patient ID: Mike Moran MRN: [id number]; DOB: 1956-01-23  Admit date: 03/25/2020 Date of Consult: 03/25/2020  Primary Care Provider: Assunta FoundGolding, John, MD Primary Cardiologist: No primary care provider on file.   Patient Profile:  Mike Moran is a 65 y.o. male with a hx of anxiety, depression, former tobacco abuse who is being seen today for the evaluation of AV block at the request of Huey Bienenstockawood Elgergawy, MD.  History of Present Illness:  Mr. Mike Moran was admitted to [facility] on 03/25/2020 for acute hypoxic respiratory failure 2/2 covid 19 PNA. He was found to have a large R sided loculated pleural effusion and [facility]. He has been started on Abx and Covid 19 PNA treatment. He underwent chest tube placement this afternoon by critical care medicine. Around 12:56 PM he developed intermittent AV block. No symptoms of syncope or dizziness reported. He had several episodes captured on tele. He reports he is in a lot of pain from the chest tube and was taking very shallow breaths at the time of my exam. No prior heart history. BNP 189 but no chest tube. Lactic acid 1.7. WBC 29k, HGB 11.6. K 3.6. EKG demonstrates sinus rhythm with PACs. Narrow QRS 97 ms. Normal CVD exam. He is on no AV nodal agents.   Heart Pathway Score:       Past Medical History: Past Medical History:  Diagnosis Date  . Anxiety   . Depression     Past Surgical History: History reviewed. No pertinent surgical history.   Home Medications:  Prior to Admission medications   Medication Sig Start Date End Date Taking? Authorizing Provider  aspirin EC 81 MG tablet Take 1 tablet (81 mg total) by mouth daily. For heart health 08/04/15  Yes Armandina StammerNwoko, Agnes I, NP  DULoxetine (CYMBALTA) 60 MG capsule Take 1 capsule (60 mg total) by mouth daily. 02/29/20  Yes Myrlene Brokeross, Deborah R, MD  hydrOXYzine (ATARAX/VISTARIL) 25 MG tablet Take 1 tablet (25 mg total) by mouth every 6 (six) hours as needed. for  anxiety 02/29/20  Yes Myrlene Brokeross, Deborah R, MD  Multiple Vitamins-Minerals (MULTIVITAMIN WITH MINERALS) tablet Take 1 tablet by mouth daily.   Yes [provider]  risperiDONE (RISPERDAL) 0.5 MG tablet TAKE (1) TABLET BY MOUTH AT BEDTIME. 02/29/20  Yes Myrlene Brokeross, Deborah R, MD  traZODone (DESYREL) 50 MG tablet Take 1 tablet (50 mg total) by mouth at bedtime as needed and may repeat dose one time if needed for sleep. 02/29/20  Yes Myrlene Brokeross, Deborah R, MD  doxycycline (VIBRAMYCIN) 100 MG capsule Take 1 capsule (100 mg total) by mouth 2 (two) times daily. Patient not taking: Reported on 03/25/2020 05/20/19   Pauline Ausriplett, Tammy, PA-C  triamcinolone cream (KENALOG) 0.1 % Apply 1 application topically 3 (three) times daily. To the affected area Patient not taking: Reported on 03/25/2020 05/20/19   Pauline Ausriplett, Tammy, PA-C    Inpatient Medications: Scheduled Meds: . albuterol  2 puff Inhalation Q6H  . alteplase (TPA) for intrapleural administration  10 mg Intrapleural Daily   And  . pulmozyme (DORNASE) for intrapleural administration  5 mg Intrapleural Q1400  . vitamin C  500 mg Oral Daily  . DULoxetine  60 mg Oral Daily  . insulin aspart  0-15 Units Subcutaneous TID WC  . insulin aspart  0-5 Units Subcutaneous QHS  . LORazepam  0-4 mg Intravenous Q6H   Or  . LORazepam  0-4 mg Oral Q6H  . [START ON 03/27/2020] LORazepam  0-4  mg Intravenous Q12H   Or  . [START ON 03/27/2020] LORazepam  0-4 mg Oral Q12H  . methylPREDNISolone (SOLU-MEDROL) injection  0.5 mg/kg Intravenous Q12H   Followed by  . [START ON 03/28/2020] predniSONE  50 mg Oral Daily  . pantoprazole (PROTONIX) IV  40 mg Intravenous QHS  . risperiDONE  0.5 mg Oral QHS  . sodium chloride flush  10 mL Intracatheter Q8H  . thiamine  100 mg Oral Daily   Or  . thiamine  100 mg Intravenous Daily  . traZODone  100 mg Oral QHS  . zinc sulfate  220 mg Oral Daily   Continuous Infusions: . ceFEPime (MAXIPIME) IV 2 g (03/25/20 1455)  . [START ON 03/26/2020]  remdesivir 100 mg in NS 100 mL    . vancomycin     PRN Meds: acetaminophen, guaiFENesin-dextromethorphan, oxyCODONE, traMADol  Allergies:    Allergies  Allergen Reactions  . Codeine Nausea And Vomiting  . Lexapro [Escitalopram Oxalate] Other (See Comments)    lethargic    Social History:   Social History   Socioeconomic History  . Marital status: Married    Spouse name: Not on file  . Number of children: Not on file  . Years of education: Not on file  . Highest education level: Not on file  Occupational History  . Not on file  Tobacco Use  . Smoking status: Former Games developer  . Smokeless tobacco: Never Used  Substance and Sexual Activity  . Alcohol use: Yes    Alcohol/week: 0.0 standard drinks    Comment: occ- pt denies SA use  . Drug use: No  . Sexual activity: Not on file  Other Topics Concern  . Not on file  Social History Narrative  . Not on file   Social Determinants of Health   Financial Resource Strain: Not on file  Food Insecurity: Not on file  Transportation Needs: Not on file  Physical Activity: Not on file  Stress: Not on file  Social Connections: Not on file  Intimate Partner Violence: Not on file     Family History:   History reviewed. No pertinent family history.   ROS:  All other ROS reviewed and negative. Pertinent positives noted in the HPI.     Physical Exam/Data:   Vitals:   03/25/20 1015 03/25/20 1146 03/25/20 1314 03/25/20 1555  BP: 100/73 94/65 99/68  102/63  Pulse: 83 74  79  Resp: 20 14  (!) 21  Temp: (!) 97.5 F (36.4 C) 97.8 F (36.6 C)  98 F (36.7 C)  TempSrc: Oral Oral  Oral  SpO2: 96% 95%  97%  Weight:      Height:         Intake/Output Summary (Last 24 hours) at 03/25/2020 1828 Last data filed at 03/25/2020 1828 Gross per 24 hour  Intake 500 ml  Output 1400 ml  Net -900 ml    Last 3 Weights 03/25/2020 05/20/2019 07/29/2015  Weight (lbs) 140 lb 150 lb 145 lb  Weight (kg) 63.504 kg 68.04 kg 65.772 kg  Some encounter  information is confidential and restricted. Go to Review Flowsheets activity to see all data.    Body mass index is 18.99 kg/m.  General: thin, ill-appearing  Head: Atraumatic, normal size  Eyes: PEERLA, EOMI  Neck: Supple, no JVD Endocrine: No thryomegaly Cardiac: Normal S1, S2; RRR; no murmurs, rubs, or gallops Lungs: diminished breath sounds in R lung fields  Abd: Soft, nontender, no hepatomegaly  Ext: No edema, pulses 2+ Musculoskeletal:  No deformities, BUE and BLE strength normal and equal Skin: Warm and dry, no rashes   Neuro: Alert and oriented to person, place, time, and situation, CNII-XII grossly intact, no focal deficits  Psych: Normal mood and affect   EKG:  The EKG was personally reviewed and demonstrates:  NSR 77 bpm with PACs, normal QRS Telemetry:  Telemetry was personally reviewed and demonstrates:  Intermittent AV block  Relevant CV Studies: Echo pending   Laboratory Data: High Sensitivity Troponin:   Recent Labs  Lab 03/25/20 0205 03/25/20 0430  TROPONINIHS 4 5     Cardiac EnzymesNo results for input(s): TROPONINI in the last 168 hours. No results for input(s): TROPIPOC in the last 168 hours.  Chemistry Recent Labs  Lab 03/25/20 0205  NA 135  K 3.6  CL 101  CO2 26  GLUCOSE 133*  BUN 20  CREATININE 0.65  CALCIUM 8.9  GFRNONAA >60  ANIONGAP 8    Recent Labs  Lab 03/25/20 0205 03/25/20 1409  PROT 6.7 6.3*  ALBUMIN 3.1*  --   AST 16  --   ALT 14  --   ALKPHOS 50  --   BILITOT 0.5  --    Hematology Recent Labs  Lab 03/25/20 0205  WBC 29.5*  RBC 3.79*  HGB 11.6*  HCT 34.5*  MCV 91.0  MCH 30.6  MCHC 33.6  RDW 14.6  PLT 467*   BNP Recent Labs  Lab 03/25/20 0205  BNP 189.0*    DDimer  Recent Labs  Lab 03/25/20 0205  DDIMER 3.46*    Radiology/Studies:  CT ANGIO CHEST PE W OR WO CONTRAST  Result Date: 03/25/2020 CLINICAL DATA:  Shortness of breath. EXAM: CT ANGIOGRAPHY CHEST WITH CONTRAST TECHNIQUE: Multidetector CT  imaging of the chest was performed using the standard protocol during bolus administration of intravenous contrast. Multiplanar CT image reconstructions and MIPs were obtained to evaluate the vascular anatomy. CONTRAST:  OMNIPAQUE IOHEXOL 350 MG/ML SOLN COMPARISON:  None. FINDINGS: Cardiovascular: Satisfactory opacification of the pulmonary arteries to the segmental level. No evidence of pulmonary embolism. Normal heart size. No pericardial effusion. Atheromatous calcification of the aorta Mediastinum/Nodes: No visible adenopathy or mass. Lungs/Pleura: Large right pleural effusion with loculation causing scalloping of the lung. The apicomedial opacity by radiography is also loculated pleural fluid. No visible mass. Extensive atelectasis in the right lung with collapsed lower and middle lobes. Secretions/debris seen in the central right-sided airways. Mild left-sided atelectasis. Tiny left apical pulmonary nodule measuring 3 mm, which can be re-evaluated at follow-up. Upper Abdomen: Right adrenal mass which is partially covered at 5.6 cm with low-density appearance and coarse calcifications. A right suprarenal mass of similar size was described on an abdominal ultrasound April 21, 2008. Musculoskeletal: No bony erosion adjacent to the pleural fluid. Review of the MIP images confirms the above findings. IMPRESSION: 1. Large and loculated right pleural effusion with extensive right pulmonary collapse. No visible underlying tumor; empyema is the leading consideration. 2. Partially covered 5.6 cm right adrenal mass, chronic and benign based on a 2010 abdominal ultrasound. Electronically Signed   By: Marnee Spring M.D.   On: 03/25/2020 04:20   DG CHEST PORT 1 VIEW  Result Date: 03/25/2020 CLINICAL DATA:  Chest tube. EXAM: PORTABLE CHEST 1 VIEW COMPARISON:  CT 03/25/2020.  Chest x-ray 03/25/2020. FINDINGS: Interim placement right chest tube. Interim significant improvement right-sided pleural effusion with mild  to moderate residual. Loculated pleural fluid component appears to be present over the upper medial chest.  Right base atelectasis. Heart size stable. IMPRESSION: Interim placement of right chest tube. Interim significant improvement of right-sided pleural effusion with mild to moderate residual. Loculated pleural fluid component appears to be present over the upper medial chest. Associated very tiny right apical pneumothorax cannot be excluded on today's exam. Critical Value/emergent results were called by telephone at the time of interpretation on 03/25/2020 at 1:27 pm to nurse Lenda Kelp, who verbally acknowledged these results. Electronically Signed   By: Maisie Fus  Register   On: 03/25/2020 13:28   DG Chest Port 1 View  Result Date: 03/25/2020 CLINICAL DATA:  Cough and shortness of breath EXAM: PORTABLE CHEST 1 VIEW COMPARISON:  01/09/2014 FINDINGS: Large right pleural effusion. Basilar atelectasis. Left lung is clear. Medial opacity at the right lung apex. IMPRESSION: 1. Large right pleural effusion and basilar atelectasis. 2. Medial opacity at the right lung apex. CT recommended for better characterization. Electronically Signed   By: Deatra Robinson M.D.   On: 03/25/2020 02:13    Assessment and Plan:   1. 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 He has paroxysmal AV block. The P-P interval lengthens and there is no escape rhythm. He has a largely normal EKG at baseline with narrow QRS and no evidence of high grade conduction disease other than a 1AVB on several EKGs. His cardiovascular exam is normal. He has no murmurs. I see no AV nodal agents to explain this. This all occurred today after having a chest tube placed. He reports he is in a lot of pain. Given that the AV block occurs with lengthening of the P-P cycle, I suspect this is vagally mediated AV block. His P-P interval does not remain constant and he has no significant pre-existing conduction disease. I think this will not  need pacing and he is asymptomatic at this time.  -Would keep on tele overnight and hold any AV nodal agents.  -I have ordered a TSH and urine drug screen.  -I have ordered an echocardiogram to be done tomorrow.  -BNP is elevated but suspect this could be due to compression of R heart from loculated pleural effusion. No PE. No clinical evidence of HF. Troponin negative for ACS and no CP.  -Cardiology will follow along with you.   For questions or updates, please contact [facility] Please consult [email or web address] for contact info under   Signed, Gerri Spore T. Flora Lipps, MD [facility]  [facility]  03/25/2020 6:28 PM

## 2020-03-25 NOTE — ED Notes (Signed)
Date and time results received: 03/25/20 0330  Test: covid Critical Value: positive  Name of Provider Notified: Blinda Leatherwood, MD  Orders Received? Or Actions Taken?: acknowledged

## 2020-03-25 NOTE — Progress Notes (Signed)
  Patient seen and evaluated, chart reviewed, please see EMR for updated orders. Please see full H&P dictated by admitting physician Dr Josephine Cables for same date of service.    Brief Summary:-,  65 yo WM who is a Reformed smoker with PMHx relevant for anxiety, Depression, EtOH admitted on 03/25/2020  To [facility] with acute hypoxic respiratory failure in the setting of COVID-19 pneumonia and found to have large loculated right-sided pleural effusion with right lung collapse, transferred to [facility] for further IR/CT surgery and/or PCCM input  A/p 1)Acute respiratory failure with hypoxia secondary to sepsis due to COVID-19 virus pneumonia superimposed with loculated right pleural effusion  with right lung collapse---highly likely to be empyema----- await pleural fluid studies including Gram stain and culture after chest tube placement --PCCM consult at [facility] appreciated -Plans for chest tube placement by PCCM -Patient may need CT surgery and/ or IR input  2)Acute hypoxic respiratory failure secondary to COVID-19 infection/Pneumonia--- The treatment plan and use of medications  for treatment of COVID-19 infection and possible side effects were discussed with patient -----Patient verbalizes understanding and agrees to treatment protocols   --Patient is positive for COVID-19 infection, chest x-ray with findings of infiltrates/opacities,  patient is tachypneic/hypoxic and requiring continuous supplemental oxygen---patient meets criteria for initiation of Remdesivir AND Steroid therapy per protocol  --Check and trend inflammatory markers including D-dimer, ferritin and  CRP---also follow CBC and CMP --Supplemental oxygen to keep O2 sats above 93% -Follow serial chest x-rays and ABGs as indicated --- Encourage prone positioning for More than 16 hours/day in increments of 2 to 3 hours at a time if able to tolerate --Attempt to maintain euvolemic state --Zinc and vitamin C as  ordered -Albuterol inhaler as needed -Accu-Cheks/fingersticks while on high-dose steroids -PPI while on high-dose steroids - COVID-19 Labs  Recent Labs    03/25/20 0205  DDIMER 3.46*  FERRITIN 225  LDH 99  CRP 19.7*    Lab Results  Component Value Date   SARSCOV2NAA POSITIVE (A) 03/25/2020    3)Pulmonary nodule Incidental finding of tiny left apical pulmonary nodule measuring 3 mm was noted --- PCCM involved please see #1 above  4) history of EtOH abuse----continue multivitamin and as needed lorazepam  5) sepsis due to right-sided empyema--- POA--patient met sepsis criteria on admission with fevers, tachypnea, leukocytosis and finding of right-sided empyema--- patient found to be hypoxic -Lactic acid was 1.7, thrombocytosis noted -Continue IV vancomycin and iv cefepime pending blood cultures,  - await pleural fluid studies including Gram stain and culture after chest tube placement  6) depression and anxiety--- restart Cymbalta 60 mg daily, okay to use trazodone nightly for sleep -Patient already on as needed lorazepam  -- Total care time 42 minutes  Patient seen and evaluated, chart reviewed, please see EMR for updated orders. Please see full H&P dictated by admitting physician Dr Josephine Cables for same date of service.

## 2020-03-25 NOTE — ED Triage Notes (Signed)
Pt c/o CP and SOB for past 3 days, that got worse tonight. Pt 90% on RA. Pt with productive cough.

## 2020-03-26 ENCOUNTER — Inpatient Hospital Stay (HOSPITAL_COMMUNITY): Payer: Medicare Other

## 2020-03-26 DIAGNOSIS — A419 Sepsis, unspecified organism: Secondary | ICD-10-CM

## 2020-03-26 DIAGNOSIS — R7881 Bacteremia: Secondary | ICD-10-CM

## 2020-03-26 DIAGNOSIS — R6520 Severe sepsis without septic shock: Secondary | ICD-10-CM

## 2020-03-26 DIAGNOSIS — J90 Pleural effusion, not elsewhere classified: Secondary | ICD-10-CM | POA: Diagnosis not present

## 2020-03-26 DIAGNOSIS — J918 Pleural effusion in other conditions classified elsewhere: Secondary | ICD-10-CM | POA: Diagnosis not present

## 2020-03-26 DIAGNOSIS — B957 Other staphylococcus as the cause of diseases classified elsewhere: Secondary | ICD-10-CM

## 2020-03-26 DIAGNOSIS — R001 Bradycardia, unspecified: Secondary | ICD-10-CM

## 2020-03-26 DIAGNOSIS — J1282 Pneumonia due to coronavirus disease 2019: Secondary | ICD-10-CM | POA: Diagnosis not present

## 2020-03-26 DIAGNOSIS — J189 Pneumonia, unspecified organism: Secondary | ICD-10-CM | POA: Diagnosis not present

## 2020-03-26 DIAGNOSIS — U071 COVID-19: Secondary | ICD-10-CM | POA: Diagnosis not present

## 2020-03-26 LAB — COMPREHENSIVE METABOLIC PANEL
ALT: 14 U/L (ref 0–44)
AST: 14 U/L — ABNORMAL LOW (ref 15–41)
Albumin: 2.5 g/dL — ABNORMAL LOW (ref 3.5–5.0)
Alkaline Phosphatase: 58 U/L (ref 38–126)
Anion gap: 10 (ref 5–15)
BUN: 21 mg/dL (ref 8–23)
CO2: 23 mmol/L (ref 22–32)
Calcium: 8.9 mg/dL (ref 8.9–10.3)
Chloride: 102 mmol/L (ref 98–111)
Creatinine, Ser: 0.5 mg/dL — ABNORMAL LOW (ref 0.61–1.24)
GFR, Estimated: >60 mL/min (ref >60)
Glucose, Bld: 171 mg/dL — ABNORMAL HIGH (ref 70–99)
Potassium: 4.2 mmol/L (ref 3.5–5.1)
Sodium: 135 mmol/L (ref 135–145)
Total Bilirubin: 0.6 mg/dL (ref 0.3–1.2)
Total Protein: 6 g/dL — ABNORMAL LOW (ref 6.5–8.1)

## 2020-03-26 LAB — CBC WITH DIFFERENTIAL/PLATELET
Abs Immature Granulocytes: 0.62 10*3/uL — ABNORMAL HIGH (ref 0.00–0.07)
Basophils Absolute: 0.1 10*3/uL (ref 0.0–0.1)
Basophils Relative: 0 %
Eosinophils Absolute: 0 10*3/uL (ref 0.0–0.5)
Eosinophils Relative: 0 %
HCT: 35.6 % — ABNORMAL LOW (ref 39.0–52.0)
Hemoglobin: 11.7 g/dL — ABNORMAL LOW (ref 13.0–17.0)
Immature Granulocytes: 2 %
Lymphocytes Relative: 1 %
Lymphs Abs: 0.4 10*3/uL — ABNORMAL LOW (ref 0.7–4.0)
MCH: 29.6 pg (ref 26.0–34.0)
MCHC: 32.9 g/dL (ref 30.0–36.0)
MCV: 90.1 fL (ref 80.0–100.0)
Monocytes Absolute: 1.4 10*3/uL — ABNORMAL HIGH (ref 0.1–1.0)
Monocytes Relative: 4 %
Neutro Abs: 35.9 10*3/uL — ABNORMAL HIGH (ref 1.7–7.7)
Neutrophils Relative %: 93 %
Platelets: 468 10*3/uL — ABNORMAL HIGH (ref 150–400)
RBC: 3.95 MIL/uL — ABNORMAL LOW (ref 4.22–5.81)
RDW: 14.5 % (ref 11.5–15.5)
WBC: 38.4 10*3/uL — ABNORMAL HIGH (ref 4.0–10.5)
nRBC: 0 % (ref 0.0–0.2)

## 2020-03-26 LAB — BLOOD CULTURE ID PANEL (REFLEXED) - BCID2

## 2020-03-26 LAB — ECHOCARDIOGRAM LIMITED
Area-P 1/2: 4.89 cm2
Height: 72 in
S' Lateral: 2.7 cm
Weight: 2084.67 oz

## 2020-03-26 LAB — GLUCOSE, CAPILLARY
Glucose-Capillary: 167 mg/dL — ABNORMAL HIGH (ref 70–99)
Glucose-Capillary: 154 mg/dL — ABNORMAL HIGH (ref 70–99)
Glucose-Capillary: 194 mg/dL — ABNORMAL HIGH (ref 70–99)
Glucose-Capillary: 200 mg/dL — ABNORMAL HIGH (ref 70–99)

## 2020-03-26 LAB — FERRITIN: Ferritin: 283 ng/mL (ref 24–336)

## 2020-03-26 LAB — MAGNESIUM: Magnesium: 1.8 mg/dL (ref 1.7–2.4)

## 2020-03-26 LAB — TSH: TSH: 0.744 u[IU]/mL (ref 0.350–4.500)

## 2020-03-26 LAB — D-DIMER, QUANTITATIVE: D-Dimer, Quant: 3.82 ug/mL-FEU — ABNORMAL HIGH (ref 0.00–0.50)

## 2020-03-26 LAB — C-REACTIVE PROTEIN: CRP: 18.7 mg/dL — ABNORMAL HIGH (ref <1.0)

## 2020-03-26 LAB — PHOSPHORUS: Phosphorus: 3.8 mg/dL (ref 2.5–4.6)

## 2020-03-26 MED ORDER — OXYCODONE HCL 5 MG PO TABS
5.0000 mg | ORAL_TABLET | Freq: Four times a day (QID) | ORAL | Status: DC | PRN
  Administered 2020-03-26: 5 mg via ORAL
  Filled 2020-03-26: qty 1

## 2020-03-26 MED ORDER — CEFAZOLIN SODIUM-DEXTROSE 2-4 GM/100ML-% IV SOLN
2.0000 g | Freq: Three times a day (TID) | INTRAVENOUS | Status: DC
  Administered 2020-03-26 – 2020-03-28 (×7): 2 g via INTRAVENOUS
  Filled 2020-03-26 (×10): qty 100

## 2020-03-26 MED ORDER — HYDROXYZINE HCL 25 MG PO TABS
25.0000 mg | ORAL_TABLET | Freq: Four times a day (QID) | ORAL | Status: DC | PRN
  Administered 2020-03-26 – 2020-03-30 (×8): 25 mg via ORAL
  Filled 2020-03-26 (×8): qty 1

## 2020-03-26 MED ORDER — MAGNESIUM SULFATE 2 GM/50ML IV SOLN
2.0000 g | Freq: Once | INTRAVENOUS | Status: AC
  Administered 2020-03-26: 2 g via INTRAVENOUS
  Filled 2020-03-26: qty 50

## 2020-03-26 MED ORDER — K PHOS MONO-SOD PHOS DI & MONO 155-852-130 MG PO TABS
250.0000 mg | ORAL_TABLET | Freq: Two times a day (BID) | ORAL | Status: DC
  Administered 2020-03-26 – 2020-03-30 (×8): 250 mg via ORAL
  Filled 2020-03-26 (×9): qty 1

## 2020-03-26 MED ORDER — OXYCODONE HCL 5 MG PO TABS
7.5000 mg | ORAL_TABLET | Freq: Four times a day (QID) | ORAL | Status: DC | PRN
  Administered 2020-03-26 – 2020-03-30 (×10): 7.5 mg via ORAL
  Filled 2020-03-26 (×10): qty 2

## 2020-03-26 MED ORDER — ATROPINE SULFATE 1 MG/10ML IJ SOSY
PREFILLED_SYRINGE | INTRAMUSCULAR | Status: AC
  Filled 2020-03-26: qty 10

## 2020-03-26 MED ORDER — MORPHINE SULFATE (PF) 4 MG/ML IV SOLN
4.0000 mg | INTRAVENOUS | Status: DC | PRN
  Administered 2020-03-26: 4 mg via INTRAVENOUS
  Filled 2020-03-26: qty 1

## 2020-03-26 NOTE — Progress Notes (Signed)
Will shorten remdesivir to a 3 days course per Dr. Jerral Ralph.  Ulyses Southward, PharmD, BCIDP, AAHIVP, CPP Infectious Disease Pharmacist 03/26/2020 10:53 AM

## 2020-03-26 NOTE — Progress Notes (Signed)
  Echocardiogram 2D Echocardiogram has been performed.  Mike Moran 03/26/2020, 3:10 PM

## 2020-03-26 NOTE — Procedures (Signed)
Pleural Fibrinolytic Administration Procedure Note  EFFIE JANOSKI  [id number]  January 05, 1956  Date:03/26/20  Time:10:48 AM   Provider Performing:Kellsey Sansone P Chestine Spore   Procedure: Pleural Fibrinolysis Subsequent day [phone])  Indication(s) Fibrinolysis of complicated pleural effusion  Consent Risks of the procedure as well as the alternatives and risks of each were explained to the patient and/or caregiver.  Consent for the procedure was obtained.   Anesthesia None   Time Out Verified patient identification, verified procedure, site/side was marked, verified correct patient position, special equipment/implants available, medications/allergies/relevant history reviewed, required imaging and test results available.   Sterile Technique Hand hygiene, gloves   Procedure Description Existing pleural catheter was cleaned and accessed in sterile manner.  10mg  of tPA in 30cc of saline and 5mg  of dornase in 30cc of sterile water were injected into pleural space using existing pleural catheter.  Catheter will be clamped for 1 hour and then placed back to suction. To be released at 11:45- d/w RN.   Complications/Tolerance None; patient tolerated the procedure well.   EBL None   Specimen(s) None  , DO 03/26/20 10:48 AM [facility]  From 7AM- 7PM if no response to pager, please call [phone]. After hours, 7PM- 7AM, please call Elink  [phone].

## 2020-03-26 NOTE — Consult Note (Signed)
NAME:  Mike Moran, MRN:  [id number], DOB:  20-May-1955, LOS: 1 ADMISSION DATE:  03/25/2020, CONSULTATION DATE:  03/25/20 REFERRING MD:  Elgergawy, CHIEF COMPLAINT: loculated pleural effusion    Brief History:  65 yo M + COVID, loculated pleural effusion.   History of Present Illness:  65 yo M PMH Anxiety, Depression, EtOH use presented to [facility] 2/15 with SOB. SOB, cough, back pain on the right began 3 days prior to presentation. Associated body aches and weakness. [place] beach. Pt noted to be hypoxic by EMS and was taken to ED on O2. Tested positive for COVID. CTA chest obtained which revealed a large r pleural effusion, loculated in appearance. Transferred to [facility] 2/15. CRP 19.7 LDH 99 Ferritin 225 PCT 0.32 WBC 29.5.  PCCM consulted for management of loculated pleural effusion.  Past Medical History:  Depression  Anxiety   Significant Hospital Events:  2/15 presented to [facility] for SOB, hypoxia. COVID +. CTA reveals large pleural effusion. Transferred to [facility]. PCCM consulted for pleural effusion   Consults:  PCCM   Procedures:  2/15 Chest Tube >>>   Significant Diagnostic Tests:  2/15 CTA chest > no evidence of PE. Large R pleural effusion with loculation. RML RLL collapse, significant atelectasis. R airway debris. Small 29mm L apical nodule. 5.6cm R adrenal mass (seen on 2010 abdominal ultrasound)  Pleural fluid 2/15> (1075 WBCs, 85% PMNs; LDH 528, protein 4.3)  Micro Data:  2/15 COVID +  2/15 BCx>> GPC 4/4 (staph epi on biofire)  Antimicrobials:  2/15 vanc> 2/15 cefepime>  2/15 remdesivir>  Interim History / Subjective:  Having pain from chest tube, but overall less pain.  Objective   Blood pressure 114/85, pulse 67, temperature 98.1 F (36.7 C), temperature source Oral, resp. rate 19, height 6' (1.829 m), weight 59.1 kg, SpO2 96 %.        Intake/Output Summary (Last 24 hours) at 03/26/2020 1245 Last data filed at 03/26/2020 0902 Gross per 24 hour  Intake  710 ml  Output 2025 ml  Net -1315 ml   Filed Weights   03/25/20 0141 03/26/20 0500  Weight: 63.5 kg 59.1 kg    Examination: General: chronically ill appearing man laying in bed in NAD HENT: Craig/AT, eyes anicteric, oral mucosa moist.  Lungs: breathing comfortably on , CTAB, reduced in left base. Bllod-tinged pleural fluid. Cardiovascular:  RRR, no murmurs, sinus rhythm on tele Abdomen: thin, soft, ND Extremities: no cyanosis or edema Neuro: awake, alert, moving all extremities, able to move around in bed indepdently Derm: warm, dry  Resolved Hospital Problem list     Assessment & Plan:   Acute respiratory failure with hypoxia Large R sided loculated pleural effusion COVID-19 infection Sepsis due to Staph epi bacteremia -Repeat blood cultures; had echo pending but if negative may need TEE at some point. Unlikely to happen until off isolation for covid. -con't chest tube to -20cm H20 suction -TPA & Dnase daily x 3 days -AM CXR -con't empiric antibiotics; awaiting pleural flud cultures  -airborne/contact precautions for covid -covid-19 therapies per primary team. Remdesivir, steroids, Vit C, Zinc  -trending inflammatory markers -Supplemental O2 as needed, IS -oxycodone increased to 7.5mg  Q6h PRN + morphine IV for breakthrough for pain control while chest tube remains in place  Depression Anxiety Sleep Disturbance -takes cymbalta, atarax, risperidol, trazodone at home  Best practice (evaluated daily)  Diet: NPO Pain/Anxiety/Delirium protocol (if indicated):  VAP protocol (if indicated):  DVT prophylaxis: SCDs GI prophylaxis: na Glucose control:  SSI Mobility: per primary Disposition: per primary  Goals of Care:  Last date of multidisciplinary goals of care discussion: Per primary Code Status: Full   Labs   CBC: Recent Labs  Lab 03/25/20 0205 03/26/20 0149  WBC 29.5* 38.4*  NEUTROABS 27.5* 35.9*  HGB 11.6* 11.7*  HCT 34.5* 35.6*  MCV 91.0 90.1  PLT 467*  468*    Basic Metabolic Panel: Recent Labs  Lab 03/25/20 0205 03/26/20 0149  NA 135 135  K 3.6 4.2  CL 101 102  CO2 26 23  GLUCOSE 133* 171*  BUN 20 21  CREATININE 0.65 0.50*  CALCIUM 8.9 8.9  MG  --  1.8  PHOS  --  3.8   GFR: Estimated Creatinine Clearance: 75.9 mL/min (A) (by C-G formula based on SCr of 0.5 mg/dL (L)). Recent Labs  Lab 03/25/20 0205 03/25/20 0430 03/26/20 0149  PROCALCITON 0.32  --   --   WBC 29.5*  --  38.4*  LATICACIDVEN 1.7 0.6  --     Liver Function Tests: Recent Labs  Lab 03/25/20 0205 03/25/20 1409 03/26/20 0149  AST 16  --  14*  ALT 14  --  14  ALKPHOS 50  --  58  BILITOT 0.5  --  0.6  PROT 6.7 6.3* 6.0*  ALBUMIN 3.1*  --  2.5*   Recent Labs  Lab 03/25/20 0205  LIPASE 26   No results for input(s): AMMONIA in the last 168 hours.  ABG No results found for: PHART, PCO2ART, PO2ART, HCO3, TCO2, ACIDBASEDEF, O2SAT   Coagulation Profile: No results for input(s): INR, PROTIME in the last 168 hours.  Cardiac Enzymes: No results for input(s): CKTOTAL, CKMB, CKMBINDEX, TROPONINI in the last 168 hours.  HbA1C: Hgb A1c MFr Bld  Date/Time Value Ref Range Status  01/10/2014 06:19 AM 5.6 <5.7 % Final    Comment:    (NOTE)                                                                       According to the ADA Clinical Practice Recommendations for 2011, when HbA1c is used as a screening test:  >=6.5%   Diagnostic of Diabetes Mellitus           (if abnormal result is confirmed) 5.7-6.4%   Increased risk of developing Diabetes Mellitus References:Diagnosis and Classification of Diabetes Mellitus,Diabetes Care,2011,34(Suppl 1):S62-S69 and Standards of Medical Care in         Diabetes - 2011,Diabetes Care,2011,34 (Suppl 1):S11-S61.     CBG: Recent Labs  Lab 03/25/20 1241 03/25/20 1649 03/25/20 2056 03/26/20 0831 03/26/20 1134  GLUCAP 135* 120* 138* 167* 154*    Steffanie Dunn, DO 03/26/20 1:03 PM [facility]  From 7AM- 7PM if no response to pager, please call [phone]. After hours, 7PM- 7AM, please call Elink  [phone].

## 2020-03-26 NOTE — Progress Notes (Signed)
PROGRESS NOTE                                                                                                                                                                                                             Patient Demographics:    Mike Moran, is a 65 y.o. male, DOB - Jun 07, 1955, KKX:[id number]  Outpatient Primary MD for the patient is [name], MD   Admit date - 03/25/2020   LOS - 1  Chief Complaint  Patient presents with  . Shortness of Breath       Brief Narrative: Patient is a 65 y.o. male with PMHx of anxiety/depression-presented to [facility] on 2/15 with shortness of breath-found to have large right-sided loculated pleural effusion and COVID-19 infection.   COVID-19 vaccinated status: Unvaccinated  Significant Events: 2/15>> Admit to [facility] for shortness of breath-found to have a large right-sided loculated pleural effusion and COVID-19 infection  2/16 >> transferred to [facility]   Significant studies: 2/15>> chest x-ray: Large right-sided pleural effusion 2/15>> CTA chest: Large/loculated right pleural effusion with extensive pulmonary collapse.  5.6 right adrenal mass   COVID-19 medications: Steroids: 2/15>> 2/16 Remdesivir: 2/14>>  Antibiotics: Vancomycin: 2/14>> 2/16 Cefepime: 2/14>> Ancef: 2/16>>  Microbiology data: 2/15 >>blood culture: Gram-positive cocci in clusters 2/15>> pleural fluid culture: No growth so far  Procedures: 2/15>> chest tube insertion  Consults: None  DVT prophylaxis: SCDs Start: 03/25/20 0502    Subjective:    [name] today is upset that he has not been started on his anxiety medications.  He denies any recent alcohol use-claims he quit 10 years back.   Getting agitated when I asked him questions-claims that doctors in [facility] and the doctor that he saw yesterday were "idiots".  He claims that doctor/nurses dont even speak [place].  He was speaking to a  family member on the phone-asking for his outpatient doctor to call the floor-and communicate in " English" regarding his outpatient medications. Condescending to RN and to this MD.  Claims that he is a " [place] veteran"!   This MD thanked him for service but reminded him that Doctors/Nurses are here to help him and asked him to refrain from abusing-and calling my partners idiots.   Assessment  & Plan :   Acute Hypoxic Resp Failure due to loculated large right-sided pleural effusion-likely empyema: Chest  tube in place-await culture data-PCCM following with plans to continue with IV antimicrobial therapy and pleural fibrinolytic therapy.  Coag negative staph bacteremia: Repeat cultures today-stop vancomycin-start Ancef.  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  COVID-19 infection: Unvaccinated by choice-no groundglass opacities evident on imaging-suspect does not require steroids-we will plan on Remdesivir x3 days.  Suspect majority of his issues on admission is secondary to probable empyema.  CRP significantly elevated due to probable empyema rather than COVID-19 pneumonitis.  Fever: afebrile O2 requirements:  SpO2: 96 % O2 Flow Rate (L/min): 3 L/min   COVID-19 Labs: Recent Labs    03/25/20 0205 03/26/20 0149  DDIMER 3.46* 3.82*  FERRITIN 225 283  LDH 99  --   CRP 19.7* 18.7*       Component Value Date/Time   BNP 189.0 (H) 03/25/2020 0205    Recent Labs  Lab 03/25/20 0205  PROCALCITON 0.32    Lab Results  Component Value Date   SARSCOV2NAA POSITIVE (A) 03/25/2020     Prone/Incentive Spirometry: encouraged incentive spirometry use 3-4/hour.  Right adrenal mass: Chronic-benign based on 2010 abdominal ultrasound  Alcohol abuse: Claims he quit 10 years back-watch closely-no signs of withdrawal.  3 mm left apical nodule: Stable for outpatient  follow-up  Depression/anxiety: Agitated-restart as needed Atarax-on trazodone and Cymbalta.  ABG: No results found for: PHART, PCO2ART, PO2ART, HCO3, TCO2, ACIDBASEDEF, O2SAT  Vent Settings: N/A  Condition -Guarded  Family Communication  :  Spouse-[name] updated over the phone on 2/16  Code Status :  Full Code  Diet :  Diet Order            Diet regular Room service appropriate? Yes; Fluid consistency: Thin  Diet effective now                  Disposition Plan  :   Status is: Inpatient  Remains inpatient appropriate because:Inpatient level of care appropriate due to severity of illness   Dispo: The patient is from: Home              Anticipated d/c is to: Home              Anticipated d/c date is: > 3 days              Patient currently is not medically stable to d/c.   Difficult to place patient No   Barriers to discharge: Empyema with bacteremia-chest tube in place-on IV antibiotics  Antimicorbials  :    Anti-infectives (From admission, onward)   Start     Dose/Rate Route Frequency Ordered Stop   03/26/20 1400  ceFAZolin (ANCEF) IVPB 2g/100 mL premix        2 g 200 mL/hr over 30 Minutes Intravenous Every 8 hours 03/26/20 1114     03/26/20 1000  remdesivir 100 mg in sodium chloride 0.9 % 100 mL IVPB        100 mg 200 mL/hr over 30 Minutes Intravenous Daily 03/25/20 0347 03/27/20 2359   03/25/20 2200  vancomycin (VANCOCIN) IVPB 1000 mg/200 mL premix  Status:  Discontinued        1,000 mg 200 mL/hr over 60 Minutes Intravenous Every 12 hours 03/25/20 0510 03/26/20 1114   03/25/20 1400  ceFEPIme (MAXIPIME) 2 g in sodium chloride 0.9 % 100 mL IVPB  Status:  Discontinued        2 g 200 mL/hr over 30 Minutes Intravenous Every 8  hours 03/25/20 0530 03/26/20 1114   03/25/20 0500  ceFEPIme (MAXIPIME) 2 g in sodium chloride 0.9 % 100 mL IVPB        2 g 200 mL/hr over 30 Minutes Intravenous  Once 03/25/20 0446 03/25/20 0638   03/25/20 0500  vancomycin  (VANCOCIN) IVPB 1000 mg/200 mL premix        1,000 mg 200 mL/hr over 60 Minutes Intravenous  Once 03/25/20 0452 03/25/20 0723   03/25/20 0400  remdesivir 100 mg in sodium chloride 0.9 % 100 mL IVPB        100 mg 200 mL/hr over 30 Minutes Intravenous Every 30 min 03/25/20 0347 03/25/20 0527      Inpatient Medications  Scheduled Meds: . albuterol  2 puff Inhalation Q6H  . alteplase (TPA) for intrapleural administration  10 mg Intrapleural Daily   And  . pulmozyme (DORNASE) for intrapleural administration  5 mg Intrapleural Q1400  . vitamin C  500 mg Oral Daily  . atropine      . DULoxetine  60 mg Oral Daily  . insulin aspart  0-15 Units Subcutaneous TID WC  . insulin aspart  0-5 Units Subcutaneous QHS  . LORazepam  0-4 mg Intravenous Q6H   Or  . LORazepam  0-4 mg Oral Q6H  . [START ON 03/27/2020] LORazepam  0-4 mg Intravenous Q12H   Or  . [START ON 03/27/2020] LORazepam  0-4 mg Oral Q12H  . pantoprazole (PROTONIX) IV  40 mg Intravenous QHS  . risperiDONE  0.5 mg Oral QHS  . sodium chloride flush  10 mL Intracatheter Q8H  . thiamine  100 mg Oral Daily   Or  . thiamine  100 mg Intravenous Daily  . traZODone  100 mg Oral QHS  . zinc sulfate  220 mg Oral Daily   Continuous Infusions: .  ceFAZolin (ANCEF) IV    . remdesivir 100 mg in NS 100 mL 100 mg (03/26/20 0916)   PRN Meds:.acetaminophen, guaiFENesin-dextromethorphan, hydrOXYzine, morphine injection, oxyCODONE   Time Spent in minutes  25  See all Orders from today for further details   Jeoffrey Massed M.D on 03/26/2020 at 11:58 AM  To page go to [email or web address] - use universal password  [facility] -  Office  [phone]    Objective:   Vitals:   03/25/20 2043 03/26/20 0400 03/26/20 0500 03/26/20 0900  BP: 105/70 120/67  114/85  Pulse: 72 80  67  Resp: 20 19    Temp: 98.3 F (36.8 C) 98.1 F (36.7 C)    TempSrc: Axillary Oral    SpO2: 94% 96%    Weight:   59.1 kg   Height:        Wt Readings from  Last 3 Encounters:  03/26/20 59.1 kg  05/20/19 68 kg  11/23/17 75.8 kg     Intake/Output Summary (Last 24 hours) at 03/26/2020 1158 Last data filed at 03/26/2020 0902 Gross per 24 hour  Intake 710 ml  Output 2825 ml  Net -2115 ml     Physical Exam Gen Exam:Alert awake-not in any distress HEENT:atraumatic, normocephalic Chest: B/L clear to auscultation anteriorly CVS:S1S2 regular Abdomen:soft non tender, non distended Extremities:no edema Neurology: Non focal Skin: no rash   Data Review:    CBC Recent Labs  Lab 03/25/20 0205 03/26/20 0149  WBC 29.5* 38.4*  HGB 11.6* 11.7*  HCT 34.5* 35.6*  PLT 467* 468*  MCV 91.0 90.1  MCH 30.6 29.6  MCHC 33.6 32.9  RDW 14.6 14.5  LYMPHSABS 0.5* 0.4*  MONOABS 1.3* 1.4*  EOSABS 0.0 0.0  BASOSABS 0.1 0.1    Chemistries  Recent Labs  Lab 03/25/20 0205 03/26/20 0149  NA 135 135  K 3.6 4.2  CL 101 102  CO2 26 23  GLUCOSE 133* 171*  BUN 20 21  CREATININE 0.65 0.50*  CALCIUM 8.9 8.9  MG  --  1.8  AST 16 14*  ALT 14 14  ALKPHOS 50 58  BILITOT 0.5 0.6   ------------------------------------------------------------------------------------------------------------------ Recent Labs    03/25/20 0205  TRIG 75    Lab Results  Component Value Date   HGBA1C 5.6 01/10/2014   ------------------------------------------------------------------------------------------------------------------ Recent Labs    03/26/20 0149  TSH 0.744   ------------------------------------------------------------------------------------------------------------------ Recent Labs    03/25/20 0205 03/26/20 0149  FERRITIN 225 283    Coagulation profile No results for input(s): INR, PROTIME in the last 168 hours.  Recent Labs    03/25/20 0205 03/26/20 0149  DDIMER 3.46* 3.82*    Cardiac Enzymes No results for input(s): CKMB, TROPONINI, MYOGLOBIN in the last 168 hours.  Invalid input(s):  CK ------------------------------------------------------------------------------------------------------------------    Component Value Date/Time   BNP 189.0 (H) 03/25/2020 0205    Micro Results Recent Results (from the past 240 hour(s))  Culture, blood (Routine X 2) w Reflex to ID Panel     Status: None (Preliminary result)   Collection Time: 03/25/20  2:05 AM   Specimen: Right Antecubital; Blood  Result Value Ref Range Status   Specimen Description   Final    RIGHT ANTECUBITAL Performed at [facility], [address] [place], [place] [address]    Special Requests   Final    BAA Blood Culture adequate volume Performed at [facility], [address] [place], [place] [address]    Culture  Setup Time   Final    GRAM POSITIVE COCCI IN BOTH AEROBIC AND ANAEROBIC BOTTLES Gram Stain Report Called to,Read Back By and Verified With: KNECHT,LINDSAY @ [facility] 5 W @2327  BY MATTHEWS, B 2.15.22 Performed at [facility], [address] [place], [place] [address]    Culture   Final    NO GROWTH 1 DAY Performed at [facility], [address] [place], [place] [address]    Report Status PENDING  Incomplete  Resp Panel by RT-PCR (Flu A&B, Covid) Nasopharyngeal Swab     Status: Abnormal   Collection Time: 03/25/20  2:10 AM   Specimen: Nasopharyngeal Swab; Nasopharyngeal(NP) swabs in vial transport medium  Result Value Ref Range Status   SARS Coronavirus 2 by RT PCR POSITIVE (A) NEGATIVE Final    Comment: RESULT CALLED TO, READ BACK BY AND VERIFIED WITH: WALKER,T@0326  BY MATTHEWS,B 2.15.22 (NOTE) SARS-CoV-2 target nucleic acids are DETECTED.  The SARS-CoV-2 RNA is generally detectable in upper respiratory specimens during the acute phase of infection. Positive results are indicative of the presence of the identified virus, but do not rule out bacterial infection or co-infection with other pathogens not detected by the test. Clinical correlation with patient history and other  diagnostic information is necessary to determine patient infection status. The expected result is Negative.  Fact Sheet for Patients: BloggerCourse.comhttps://www.fda.gov/media/152166/download  Fact Sheet for Healthcare Providers: SeriousBroker.ithttps://www.fda.gov/media/152162/download  This test is not yet approved or cleared by the [place] FDA and  has been authorized for detection and/or diagnosis of SARS-CoV-2 by FDA under an Emergency Use Authorization (EUA).  This EUA will remain in effect (meaning this test can  be used) for the duration  of  the COVID-19 declaration under Section 564(b)(1) of the Act, 21 U.S.C. section 360bbb-3(b)(1), unless the authorization is terminated or revoked sooner.     Influenza A by PCR NEGATIVE NEGATIVE Final   Influenza B by PCR NEGATIVE NEGATIVE Final    Comment: (NOTE) The Xpert Xpress SARS-CoV-2/FLU/RSV plus assay is intended as an aid in the diagnosis of influenza from Nasopharyngeal swab specimens and should not be used as a sole basis for treatment. Nasal washings and aspirates are unacceptable for Xpert Xpress SARS-CoV-2/FLU/RSV testing.  Fact Sheet for Patients: BloggerCourse.com  Fact Sheet for Healthcare Providers: SeriousBroker.it  This test is not yet approved or cleared by the [place] FDA and has been authorized for detection and/or diagnosis of SARS-CoV-2 by FDA under an Emergency Use Authorization (EUA). This EUA will remain in effect (meaning this test can be used) for the duration of the COVID-19 declaration under Section 564(b)(1) of the Act, 21 U.S.C. section 360bbb-3(b)(1), unless the authorization is terminated or revoked.  Performed at [facility], [address], [place], [place] [address]   Culture, blood (Routine X 2) w Reflex to ID Panel     Status: None (Preliminary result)   Collection Time: 03/25/20  2:45 AM   Specimen: Right Antecubital; Blood  Result Value Ref Range Status    Specimen Description   Final    RIGHT ANTECUBITAL Performed at [facility], [address], [place], [place] [address]    Special Requests   Final    BAA Blood Culture adequate volume Performed at [facility], [address] [place], [place] [address]    Culture  Setup Time   Final    GRAM POSITIVE COCCI IN BOTH AEROBIC AND ANAEROBIC BOTTLES Gram Stain Report Called to,Read Back By and Verified With: [email or web address] 5w@2109  by matthews,b 2.15.22 Organism ID to follow CRITICAL RESULT CALLED TO, READ BACK BY AND VERIFIED WITHMelven [facility] 03/26/20 0451 JDW Performed at [facility], [address] [place], [place] [address]    Culture   Final    NO GROWTH 1 DAY Performed at [facility], [address] [place], [place] [address]    Report Status PENDING  Incomplete  Blood Culture ID Panel (Reflexed)     Status: Abnormal   Collection Time: 03/25/20  2:45 AM  Result Value Ref Range Status   Enterococcus faecalis NOT DETECTED NOT DETECTED Final   Enterococcus Faecium NOT DETECTED NOT DETECTED Final   Listeria monocytogenes NOT DETECTED NOT DETECTED Final   Staphylococcus species DETECTED (A) NOT DETECTED Final    Comment: CRITICAL RESULT CALLED TO, READ BACK BY AND VERIFIED WITH: J LEDFORD PHARMD 03/26/20 0451 JDW    Staphylococcus aureus (BCID) NOT DETECTED NOT DETECTED Final   Staphylococcus epidermidis DETECTED (A) NOT DETECTED Final    Comment: CRITICAL RESULT CALLED TO, READ BACK BY AND VERIFIED WITH: J LEDFORD PHARMD 03/26/20 0451 JDW    Staphylococcus lugdunensis NOT DETECTED NOT DETECTED Final   Streptococcus species NOT DETECTED NOT DETECTED Final   Streptococcus agalactiae NOT DETECTED NOT DETECTED Final   Streptococcus pneumoniae NOT DETECTED NOT DETECTED Final   Streptococcus pyogenes NOT DETECTED NOT DETECTED Final   A.calcoaceticus-baumannii NOT DETECTED NOT DETECTED Final   Bacteroides fragilis NOT DETECTED NOT DETECTED Final    Enterobacterales NOT DETECTED NOT DETECTED Final   Enterobacter cloacae complex NOT DETECTED NOT DETECTED Final   Escherichia coli NOT DETECTED NOT DETECTED Final   Klebsiella aerogenes NOT DETECTED NOT DETECTED Final  Klebsiella oxytoca NOT DETECTED NOT DETECTED Final   Klebsiella pneumoniae NOT DETECTED NOT DETECTED Final   Proteus species NOT DETECTED NOT DETECTED Final   Salmonella species NOT DETECTED NOT DETECTED Final   Serratia marcescens NOT DETECTED NOT DETECTED Final   Haemophilus influenzae NOT DETECTED NOT DETECTED Final   Neisseria meningitidis NOT DETECTED NOT DETECTED Final   Pseudomonas aeruginosa NOT DETECTED NOT DETECTED Final   Stenotrophomonas maltophilia NOT DETECTED NOT DETECTED Final   Candida albicans NOT DETECTED NOT DETECTED Final   Candida auris NOT DETECTED NOT DETECTED Final   Candida glabrata NOT DETECTED NOT DETECTED Final   Candida krusei NOT DETECTED NOT DETECTED Final   Candida parapsilosis NOT DETECTED NOT DETECTED Final   Candida tropicalis NOT DETECTED NOT DETECTED Final   Cryptococcus neoformans/gattii NOT DETECTED NOT DETECTED Final   Methicillin resistance mecA/C NOT DETECTED NOT DETECTED Final    Comment: Performed at [facility], [address], [place], [place] [address]  MRSA PCR Screening     Status: None   Collection Time: 03/25/20  9:59 AM   Specimen: Nasal Mucosa; Nasopharyngeal  Result Value Ref Range Status   MRSA by PCR NEGATIVE NEGATIVE Final    Comment:        The GeneXpert MRSA Assay (FDA approved for NASAL specimens only), is one component of a comprehensive MRSA colonization surveillance program. It is not intended to diagnose MRSA infection nor to guide or monitor treatment for MRSA infections. Performed at [facility], [address], [place], [place] [address]   Body fluid culture w Gram Stain     Status: None (Preliminary result)   Collection Time: 03/25/20  4:45 PM   Specimen: Pleura; Body Fluid   Result Value Ref Range Status   Specimen Description PLEURAL FLUID RIGHT  Final   Special Requests Normal  Final   Gram Stain   Final    RARE WBC PRESENT, PREDOMINANTLY MONONUCLEAR NO ORGANISMS SEEN    Culture   Final    NO GROWTH < 24 HOURS Performed at [facility], [address], [place], [place] [address]    Report Status PENDING  Incomplete    Radiology Reports CT ANGIO CHEST PE W OR WO CONTRAST  Result Date: 03/25/2020 CLINICAL DATA:  Shortness of breath. EXAM: CT ANGIOGRAPHY CHEST WITH CONTRAST TECHNIQUE: Multidetector CT imaging of the chest was performed using the standard protocol during bolus administration of intravenous contrast. Multiplanar CT image reconstructions and MIPs were obtained to evaluate the vascular anatomy. CONTRAST:  OMNIPAQUE IOHEXOL 350 MG/ML SOLN COMPARISON:  None. FINDINGS: Cardiovascular: Satisfactory opacification of the pulmonary arteries to the segmental level. No evidence of pulmonary embolism. Normal heart size. No pericardial effusion. Atheromatous calcification of the aorta Mediastinum/Nodes: No visible adenopathy or mass. Lungs/Pleura: Large right pleural effusion with loculation causing scalloping of the lung. The apicomedial opacity by radiography is also loculated pleural fluid. No visible mass. Extensive atelectasis in the right lung with collapsed lower and middle lobes. Secretions/debris seen in the central right-sided airways. Mild left-sided atelectasis. Tiny left apical pulmonary nodule measuring 3 mm, which can be re-evaluated at follow-up. Upper Abdomen: Right adrenal mass which is partially covered at 5.6 cm with low-density appearance and coarse calcifications. A right suprarenal mass of similar size was described on an abdominal ultrasound April 21, 2008. Musculoskeletal: No bony erosion adjacent to the pleural fluid. Review of the MIP images confirms the above findings. IMPRESSION: 1. Large and loculated right  pleural effusion  with extensive right pulmonary collapse. No visible underlying tumor; empyema is the leading consideration. 2. Partially covered 5.6 cm right adrenal mass, chronic and benign based on a 2010 abdominal ultrasound. Electronically Signed   By: Marnee Spring M.D.   On: 03/25/2020 04:20   DG CHEST PORT 1 VIEW  Result Date: 03/26/2020 CLINICAL DATA:  Pleural effusion.  Chest tube. EXAM: PORTABLE CHEST 1 VIEW COMPARISON:  Chest x-ray 03/25/2020.  CT chest 03/25/2020. FINDINGS: Right chest tube in stable position. Tiny right apical pneumothorax again noted without interim change. Continued improvement in right base pleural effusion. Persistent right upper medial loculated pleural effusion again noted. Right base atelectasis/infiltrate. Heart size stable. IMPRESSION: 1. Right chest tube in stable position. Tiny right apical pneumothorax again noted without interim change. Continued improvement in right base pleural effusion. Persistent right upper medial loculated pleural effusion again noted. 2.  Right base atelectasis/infiltrate. Electronically Signed   By: Maisie Fus  Register   On: 03/26/2020 07:05   DG CHEST PORT 1 VIEW  Result Date: 03/25/2020 CLINICAL DATA:  Chest tube. EXAM: PORTABLE CHEST 1 VIEW COMPARISON:  CT 03/25/2020.  Chest x-ray 03/25/2020. FINDINGS: Interim placement right chest tube. Interim significant improvement right-sided pleural effusion with mild to moderate residual. Loculated pleural fluid component appears to be present over the upper medial chest. Right base atelectasis. Heart size stable. IMPRESSION: Interim placement of right chest tube. Interim significant improvement of right-sided pleural effusion with mild to moderate residual. Loculated pleural fluid component appears to be present over the upper medial chest. Associated very tiny right apical pneumothorax cannot be excluded on today's exam. Critical Value/emergent results were called by telephone at the time of interpretation on  03/25/2020 at 1:27 pm to nurse Lenda Kelp, who verbally acknowledged these results. Electronically Signed   By: Maisie Fus  Register   On: 03/25/2020 13:28   DG Chest Port 1 View  Result Date: 03/25/2020 CLINICAL DATA:  Cough and shortness of breath EXAM: PORTABLE CHEST 1 VIEW COMPARISON:  01/09/2014 FINDINGS: Large right pleural effusion. Basilar atelectasis. Left lung is clear. Medial opacity at the right lung apex. IMPRESSION: 1. Large right pleural effusion and basilar atelectasis. 2. Medial opacity at the right lung apex. CT recommended for better characterization. Electronically Signed   By: Deatra Robinson M.D.   On: 03/25/2020 02:13

## 2020-03-26 NOTE — Progress Notes (Signed)
PHARMACY - PHYSICIAN COMMUNICATION CRITICAL VALUE ALERT - BLOOD CULTURE IDENTIFICATION (BCID)  Mike Moran is an 65 y.o. male who presented to [facility] on 03/25/2020 with a chief complaint of empyema  Assessment:  WBC markedly elevated, chest tube in place  Name of physician (or Provider) Contacted: Dr. Antionette Char  Current antibiotics: Vancomycin/Cefepime  Changes to prescribed antibiotics recommended:  None  Results for orders placed or performed during the hospital encounter of 03/25/20  Blood Culture ID Panel (Reflexed) (Collected: 03/25/2020  2:45 AM)  Result Value Ref Range   Enterococcus faecalis NOT DETECTED NOT DETECTED   Enterococcus Faecium NOT DETECTED NOT DETECTED   Listeria monocytogenes NOT DETECTED NOT DETECTED   Staphylococcus species DETECTED (A) NOT DETECTED   Staphylococcus aureus (BCID) NOT DETECTED NOT DETECTED   Staphylococcus epidermidis DETECTED (A) NOT DETECTED   Staphylococcus lugdunensis NOT DETECTED NOT DETECTED   Streptococcus species NOT DETECTED NOT DETECTED   Streptococcus agalactiae NOT DETECTED NOT DETECTED   Streptococcus pneumoniae NOT DETECTED NOT DETECTED   Streptococcus pyogenes NOT DETECTED NOT DETECTED   A.calcoaceticus-baumannii NOT DETECTED NOT DETECTED   Bacteroides fragilis NOT DETECTED NOT DETECTED   Enterobacterales NOT DETECTED NOT DETECTED   Enterobacter cloacae complex NOT DETECTED NOT DETECTED   Escherichia coli NOT DETECTED NOT DETECTED   Klebsiella aerogenes NOT DETECTED NOT DETECTED   Klebsiella oxytoca NOT DETECTED NOT DETECTED   Klebsiella pneumoniae NOT DETECTED NOT DETECTED   Proteus species NOT DETECTED NOT DETECTED   Salmonella species NOT DETECTED NOT DETECTED   Serratia marcescens NOT DETECTED NOT DETECTED   Haemophilus influenzae NOT DETECTED NOT DETECTED   Neisseria meningitidis NOT DETECTED NOT DETECTED   Pseudomonas aeruginosa NOT DETECTED NOT DETECTED   Stenotrophomonas maltophilia NOT DETECTED NOT DETECTED    Candida albicans NOT DETECTED NOT DETECTED   Candida auris NOT DETECTED NOT DETECTED   Candida glabrata NOT DETECTED NOT DETECTED   Candida krusei NOT DETECTED NOT DETECTED   Candida parapsilosis NOT DETECTED NOT DETECTED   Candida tropicalis NOT DETECTED NOT DETECTED   Cryptococcus neoformans/gattii NOT DETECTED NOT DETECTED   Methicillin resistance mecA/C NOT DETECTED NOT DETECTED    [name] 03/26/2020  5:02 AM

## 2020-03-26 NOTE — Progress Notes (Signed)
Cardiology Progress Note  Patient ID: GILVERTO DILEONARDO MRN: [id number] DOB: 1955/08/01 Date of Encounter: 03/26/2020  Primary Cardiologist: No primary care provider on file.  Subjective   Chief Complaint: shortness of breath  HPI: Still having frequent paroxysmal AV block. Had prolonged block up to 15 seconds. Around 8:47 AM. No symptoms reported. He reports no dizziness lightheadedness. He reports his pain is better.  ROS:  All other ROS reviewed and negative. Pertinent positives noted in the HPI.     Inpatient Medications  Scheduled Meds: . albuterol  2 puff Inhalation Q6H  . alteplase (TPA) for intrapleural administration  10 mg Intrapleural Daily   And  . pulmozyme (DORNASE) for intrapleural administration  5 mg Intrapleural Q1400  . vitamin C  500 mg Oral Daily  . DULoxetine  60 mg Oral Daily  . insulin aspart  0-15 Units Subcutaneous TID WC  . insulin aspart  0-5 Units Subcutaneous QHS  . LORazepam  0-4 mg Intravenous Q6H   Or  . LORazepam  0-4 mg Oral Q6H  . [START ON 03/27/2020] LORazepam  0-4 mg Intravenous Q12H   Or  . [START ON 03/27/2020] LORazepam  0-4 mg Oral Q12H  . methylPREDNISolone (SOLU-MEDROL) injection  0.5 mg/kg Intravenous Q12H   Followed by  . [START ON 03/28/2020] predniSONE  50 mg Oral Daily  . pantoprazole (PROTONIX) IV  40 mg Intravenous QHS  . risperiDONE  0.5 mg Oral QHS  . sodium chloride flush  10 mL Intracatheter Q8H  . thiamine  100 mg Oral Daily   Or  . thiamine  100 mg Intravenous Daily  . traZODone  100 mg Oral QHS  . zinc sulfate  220 mg Oral Daily   Continuous Infusions: . ceFEPime (MAXIPIME) IV 2 g (03/26/20 0555)  . remdesivir 100 mg in NS 100 mL 100 mg (03/26/20 0916)  . vancomycin 1,000 mg (03/25/20 2300)   PRN Meds: acetaminophen, guaiFENesin-dextromethorphan, hydrOXYzine, oxyCODONE, traMADol   Vital Signs   Vitals:   03/25/20 2043 03/26/20 0400 03/26/20 0500 03/26/20 0900  BP: 105/70 120/67  114/85  Pulse: 72 80  67   Resp: 20 19    Temp: 98.3 F (36.8 C) 98.1 F (36.7 C)    TempSrc: Axillary Oral    SpO2: 94% 96%    Weight:   59.1 kg   Height:        Intake/Output Summary (Last 24 hours) at 03/26/2020 1034 Last data filed at 03/26/2020 0640 Gross per 24 hour  Intake 410 ml  Output 2825 ml  Net -2415 ml   Last 3 Weights 03/26/2020 03/25/2020 05/20/2019  Weight (lbs) 130 lb 4.7 oz 140 lb 150 lb  Weight (kg) 59.1 kg 63.504 kg 68.04 kg  Some encounter information is confidential and restricted. Go to Review Flowsheets activity to see all data.      Telemetry  Overnight telemetry shows sinus rhythm, paroxysmal AV block noted, prolonged block up to 15 seconds noted on 8:47 AM, which I personally reviewed.   ECG  The most recent ECG shows sinus rhythm heart rate 77, PACs noted, narrow QRS, which I personally reviewed.   Physical Exam   Vitals:   03/25/20 2043 03/26/20 0400 03/26/20 0500 03/26/20 0900  BP: 105/70 120/67  114/85  Pulse: 72 80  67  Resp: 20 19    Temp: 98.3 F (36.8 C) 98.1 F (36.7 C)    TempSrc: Axillary Oral    SpO2: 94% 96%    Weight:  59.1 kg   Height:         Intake/Output Summary (Last 24 hours) at 03/26/2020 1034 Last data filed at 03/26/2020 0640 Gross per 24 hour  Intake 410 ml  Output 2825 ml  Net -2415 ml    Last 3 Weights 03/26/2020 03/25/2020 05/20/2019  Weight (lbs) 130 lb 4.7 oz 140 lb 150 lb  Weight (kg) 59.1 kg 63.504 kg 68.04 kg  Some encounter information is confidential and restricted. Go to Review Flowsheets activity to see all data.    Body mass index is 17.67 kg/m.  General: Well nourished, well developed, in no acute distress Head: Atraumatic, normal size  Eyes: PEERLA, EOMI  Neck: Supple, no JVD Endocrine: No thryomegaly Cardiac: Normal S1, S2; RRR; no murmurs, rubs, or gallops Lungs: Diminished breath sounds in the right lung field Abd: Soft, nontender, no hepatomegaly  Ext: No edema, pulses 2+ Musculoskeletal: No deformities, BUE and  BLE strength normal and equal Skin: Warm and dry, no rashes   Neuro: Alert and oriented to person, place, time, and situation, CNII-XII grossly intact, no focal deficits  Psych: Normal mood and affect   Labs  High Sensitivity Troponin:   Recent Labs  Lab 03/25/20 0205 03/25/20 0430  TROPONINIHS 4 5     Cardiac EnzymesNo results for input(s): TROPONINI in the last 168 hours. No results for input(s): TROPIPOC in the last 168 hours.  Chemistry Recent Labs  Lab 03/25/20 0205 03/25/20 1409 03/26/20 0149  NA 135  --  135  K 3.6  --  4.2  CL 101  --  102  CO2 26  --  23  GLUCOSE 133*  --  171*  BUN 20  --  21  CREATININE 0.65  --  0.50*  CALCIUM 8.9  --  8.9  PROT 6.7 6.3* 6.0*  ALBUMIN 3.1*  --  2.5*  AST 16  --  14*  ALT 14  --  14  ALKPHOS 50  --  58  BILITOT 0.5  --  0.6  GFRNONAA >60  --  >60  ANIONGAP 8  --  10    Hematology Recent Labs  Lab 03/25/20 0205 03/26/20 0149  WBC 29.5* 38.4*  RBC 3.79* 3.95*  HGB 11.6* 11.7*  HCT 34.5* 35.6*  MCV 91.0 90.1  MCH 30.6 29.6  MCHC 33.6 32.9  RDW 14.6 14.5  PLT 467* 468*   BNP Recent Labs  Lab 03/25/20 0205  BNP 189.0*    DDimer  Recent Labs  Lab 03/25/20 0205 03/26/20 0149  DDIMER 3.46* 3.82*     Radiology  CT ANGIO CHEST PE W OR WO CONTRAST  Result Date: 03/25/2020 CLINICAL DATA:  Shortness of breath. EXAM: CT ANGIOGRAPHY CHEST WITH CONTRAST TECHNIQUE: Multidetector CT imaging of the chest was performed using the standard protocol during bolus administration of intravenous contrast. Multiplanar CT image reconstructions and MIPs were obtained to evaluate the vascular anatomy. CONTRAST:  100mL OMNIPAQUE IOHEXOL 350 MG/ML SOLN COMPARISON:  None. FINDINGS: Cardiovascular: Satisfactory opacification of the pulmonary arteries to the segmental level. No evidence of pulmonary embolism. Normal heart size. No pericardial effusion. Atheromatous calcification of the aorta Mediastinum/Nodes: No visible adenopathy or  mass. Lungs/Pleura: Large right pleural effusion with loculation causing scalloping of the lung. The apicomedial opacity by radiography is also loculated pleural fluid. No visible mass. Extensive atelectasis in the right lung with collapsed lower and middle lobes. Secretions/debris seen in the central right-sided airways. Mild left-sided atelectasis. Tiny left apical pulmonary nodule  measuring 3 mm, which can be re-evaluated at follow-up. Upper Abdomen: Right adrenal mass which is partially covered at 5.6 cm with low-density appearance and coarse calcifications. A right suprarenal mass of similar size was described on an abdominal ultrasound April 21, 2008. Musculoskeletal: No bony erosion adjacent to the pleural fluid. Review of the MIP images confirms the above findings. IMPRESSION: 1. Large and loculated right pleural effusion with extensive right pulmonary collapse. No visible underlying tumor; empyema is the leading consideration. 2. Partially covered 5.6 cm right adrenal mass, chronic and benign based on a 2010 abdominal ultrasound. Electronically Signed   By: Marnee Spring M.D.   On: 03/25/2020 04:20   DG CHEST PORT 1 VIEW  Result Date: 03/26/2020 CLINICAL DATA:  Pleural effusion.  Chest tube. EXAM: PORTABLE CHEST 1 VIEW COMPARISON:  Chest x-ray 03/25/2020.  CT chest 03/25/2020. FINDINGS: Right chest tube in stable position. Tiny right apical pneumothorax again noted without interim change. Continued improvement in right base pleural effusion. Persistent right upper medial loculated pleural effusion again noted. Right base atelectasis/infiltrate. Heart size stable. IMPRESSION: 1. Right chest tube in stable position. Tiny right apical pneumothorax again noted without interim change. Continued improvement in right base pleural effusion. Persistent right upper medial loculated pleural effusion again noted. 2.  Right base atelectasis/infiltrate. Electronically Signed   By: Maisie Fus  Register   On: 03/26/2020  07:05   DG CHEST PORT 1 VIEW  Result Date: 03/25/2020 CLINICAL DATA:  Chest tube. EXAM: PORTABLE CHEST 1 VIEW COMPARISON:  CT 03/25/2020.  Chest x-ray 03/25/2020. FINDINGS: Interim placement right chest tube. Interim significant improvement right-sided pleural effusion with mild to moderate residual. Loculated pleural fluid component appears to be present over the upper medial chest. Right base atelectasis. Heart size stable. IMPRESSION: Interim placement of right chest tube. Interim significant improvement of right-sided pleural effusion with mild to moderate residual. Loculated pleural fluid component appears to be present over the upper medial chest. Associated very tiny right apical pneumothorax cannot be excluded on today's exam. Critical Value/emergent results were called by telephone at the time of interpretation on 03/25/2020 at 1:27 pm to nurse Lenda Kelp, who verbally acknowledged these results. Electronically Signed   By: Maisie Fus  Register   On: 03/25/2020 13:28   DG Chest Port 1 View  Result Date: 03/25/2020 CLINICAL DATA:  Cough and shortness of breath EXAM: PORTABLE CHEST 1 VIEW COMPARISON:  01/09/2014 FINDINGS: Large right pleural effusion. Basilar atelectasis. Left lung is clear. Medial opacity at the right lung apex. IMPRESSION: 1. Large right pleural effusion and basilar atelectasis. 2. Medial opacity at the right lung apex. CT recommended for better characterization. Electronically Signed   By: Deatra Robinson M.D.   On: 03/25/2020 02:13    Cardiac Studies  Echo pending  Patient Profile  Mike Moran is a 65 y.o. male with anxiety, depression, former tobacco abuse who was admitted on 03/25/2020 with COVID-19 pneumonia and right empyema. He is status post chest tube placement. Cardiology consulted on 03/25/2020 for paroxysmal AV block.  Assessment & Plan   1. 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 The PP interval shortness  after and there is no escape rhythm. This is likely concerning for extrinsic vagal response. He has no symptoms from this. Baseline EKG with narrow QRS. TSH normal. -Echocardiogram is pending. -On no AV nodal agents. -Review of medication shows no identifiable cause. -He did have a prolonged episode on 03/26/2020 around 8:47 AM. This is up to  15 seconds without an escape rhythm. No symptoms reported. No dizziness or lightheadedness. -Overall I still suspect this is vagally mediated. Would recommend to keep atropine at the bedside as well as to keep pads on his chest in case intermittent pacing is needed. Hopefully with improvement in his condition he will have less episodes. Cardiology will continue to closely follow him while he is here. His case was discussed with electrophysiology. She need any surgeries or procedures or become more stable we would consider a temporary pacing wire. Right now he is asymptomatic and very stable. We will closely monitor.   For questions or updates, please contact [facility] Please consult [email or web address] for contact info under   Time Spent with Patient: I have spent a total of 25 minutes with patient reviewing hospital notes, telemetry, EKGs, labs and examining the patient as well as establishing an assessment and plan that was discussed with the patient.  > 50% of time was spent in direct patient care.    Signed, Lenna Gilford. Flora Lipps, MD [facility]  03/26/2020 10:34 AM

## 2020-03-26 NOTE — Progress Notes (Signed)
Pharmacy Antibiotic Note  TAGGERT BOZZI is a 65 y.o. male admitted on 03/25/2020 with pneumonia.  Pharmacy has been consulted for Vancomycin dosing. WBC is elevated. Renal function good.   Pt is now positive for 4/4 bottles with staph epi and mecA neg. D/w Dr Jerral Ralph and we will optimize his abx to cefazolin. He is has CT in place for draining empyema.   CrCl ~76  Plan: Dc vanc/cefepime Cefazolin 2g IV q8   Height: 6' (182.9 cm) Weight: 59.1 kg (130 lb 4.7 oz) IBW/kg (Calculated) : 77.6  Temp (24hrs), Avg:98 F (36.7 C), Min:97.8 F (36.6 C), Max:98.3 F (36.8 C)  Recent Labs  Lab 03/25/20 0205 03/25/20 0430 03/26/20 0149  WBC 29.5*  --  38.4*  CREATININE 0.65  --  0.50*  LATICACIDVEN 1.7 0.6  --     Estimated Creatinine Clearance: 75.9 mL/min (A) (by C-G formula based on SCr of 0.5 mg/dL (L)).    Allergies  Allergen Reactions  . Codeine Nausea And Vomiting  . Lexapro [Escitalopram Oxalate] Other (See Comments)    lethargic    2/15 BCX: 4/4 staph epi 2/15 Influenza negative 2/15 SARS-2 CV is positive 2/16 blood>>  Ulyses Southward, PharmD, [place], AAHIVP, CPP Infectious Disease Pharmacist 03/26/2020 11:20 AM

## 2020-03-27 ENCOUNTER — Inpatient Hospital Stay (HOSPITAL_COMMUNITY): Payer: Medicare Other

## 2020-03-27 DIAGNOSIS — J1282 Pneumonia due to coronavirus disease 2019: Secondary | ICD-10-CM | POA: Diagnosis not present

## 2020-03-27 DIAGNOSIS — A419 Sepsis, unspecified organism: Secondary | ICD-10-CM | POA: Diagnosis not present

## 2020-03-27 DIAGNOSIS — J90 Pleural effusion, not elsewhere classified: Secondary | ICD-10-CM | POA: Diagnosis not present

## 2020-03-27 DIAGNOSIS — I4430 Unspecified atrioventricular block: Secondary | ICD-10-CM

## 2020-03-27 DIAGNOSIS — U071 COVID-19: Secondary | ICD-10-CM | POA: Diagnosis not present

## 2020-03-27 DIAGNOSIS — J918 Pleural effusion in other conditions classified elsewhere: Secondary | ICD-10-CM | POA: Diagnosis not present

## 2020-03-27 DIAGNOSIS — J189 Pneumonia, unspecified organism: Secondary | ICD-10-CM | POA: Diagnosis not present

## 2020-03-27 LAB — COMPREHENSIVE METABOLIC PANEL
ALT: 35 U/L (ref 0–44)
AST: 38 U/L (ref 15–41)
Albumin: 2.4 g/dL — ABNORMAL LOW (ref 3.5–5.0)
Alkaline Phosphatase: 51 U/L (ref 38–126)
Anion gap: 10 (ref 5–15)
BUN: 22 mg/dL (ref 8–23)
CO2: 26 mmol/L (ref 22–32)
Calcium: 8.8 mg/dL — ABNORMAL LOW (ref 8.9–10.3)
Chloride: 101 mmol/L (ref 98–111)
Creatinine, Ser: 0.62 mg/dL (ref 0.61–1.24)
GFR, Estimated: >60 mL/min (ref >60)
Glucose, Bld: 131 mg/dL — ABNORMAL HIGH (ref 70–99)
Potassium: 4.4 mmol/L (ref 3.5–5.1)
Sodium: 137 mmol/L (ref 135–145)
Total Bilirubin: 0.4 mg/dL (ref 0.3–1.2)
Total Protein: 5.7 g/dL — ABNORMAL LOW (ref 6.5–8.1)

## 2020-03-27 LAB — CBC WITH DIFFERENTIAL/PLATELET
Abs Immature Granulocytes: 0.26 10*3/uL — ABNORMAL HIGH (ref 0.00–0.07)
Basophils Absolute: 0.1 10*3/uL (ref 0.0–0.1)
Basophils Relative: 0 %
Eosinophils Absolute: 0 10*3/uL (ref 0.0–0.5)
Eosinophils Relative: 0 %
HCT: 30.9 % — ABNORMAL LOW (ref 39.0–52.0)
Hemoglobin: 10.9 g/dL — ABNORMAL LOW (ref 13.0–17.0)
Immature Granulocytes: 1 %
Lymphocytes Relative: 3 %
Lymphs Abs: 0.8 10*3/uL (ref 0.7–4.0)
MCH: 30.8 pg (ref 26.0–34.0)
MCHC: 35.3 g/dL (ref 30.0–36.0)
MCV: 87.3 fL (ref 80.0–100.0)
Monocytes Absolute: 1.8 10*3/uL — ABNORMAL HIGH (ref 0.1–1.0)
Monocytes Relative: 6 %
Neutro Abs: 26.4 10*3/uL — ABNORMAL HIGH (ref 1.7–7.7)
Neutrophils Relative %: 90 %
Platelets: 471 10*3/uL — ABNORMAL HIGH (ref 150–400)
RBC: 3.54 MIL/uL — ABNORMAL LOW (ref 4.22–5.81)
RDW: 14.6 % (ref 11.5–15.5)
WBC: 29.4 10*3/uL — ABNORMAL HIGH (ref 4.0–10.5)
nRBC: 0 % (ref 0.0–0.2)

## 2020-03-27 LAB — GLUCOSE, CAPILLARY
Glucose-Capillary: 125 mg/dL — ABNORMAL HIGH (ref 70–99)
Glucose-Capillary: 119 mg/dL — ABNORMAL HIGH (ref 70–99)
Glucose-Capillary: 81 mg/dL (ref 70–99)
Glucose-Capillary: 127 mg/dL — ABNORMAL HIGH (ref 70–99)

## 2020-03-27 LAB — PATHOLOGIST SMEAR REVIEW

## 2020-03-27 LAB — C-REACTIVE PROTEIN: CRP: 12.1 mg/dL — ABNORMAL HIGH (ref <1.0)

## 2020-03-27 LAB — D-DIMER, QUANTITATIVE: D-Dimer, Quant: 3.39 ug/mL-FEU — ABNORMAL HIGH (ref 0.00–0.50)

## 2020-03-27 LAB — MAGNESIUM: Magnesium: 2.1 mg/dL (ref 1.7–2.4)

## 2020-03-27 LAB — FERRITIN: Ferritin: 412 ng/mL — ABNORMAL HIGH (ref 24–336)

## 2020-03-27 LAB — PHOSPHORUS: Phosphorus: 3.6 mg/dL (ref 2.5–4.6)

## 2020-03-27 MED ORDER — ENOXAPARIN SODIUM 40 MG/0.4ML ~~LOC~~ SOLN
40.0000 mg | SUBCUTANEOUS | Status: DC
  Filled 2020-03-27: qty 0.4

## 2020-03-27 MED ORDER — PSYLLIUM 95 % PO PACK
1.0000 | PACK | Freq: Every day | ORAL | Status: DC
  Administered 2020-03-27 – 2020-03-30 (×4): 1 via ORAL
  Filled 2020-03-27 (×4): qty 1

## 2020-03-27 MED ORDER — ENOXAPARIN SODIUM 30 MG/0.3ML ~~LOC~~ SOLN
30.0000 mg | Freq: Two times a day (BID) | SUBCUTANEOUS | Status: DC
  Administered 2020-03-27 – 2020-03-30 (×7): 30 mg via SUBCUTANEOUS
  Filled 2020-03-27 (×6): qty 0.3

## 2020-03-27 NOTE — Plan of Care (Signed)
  Problem: Education: Goal: Knowledge of General Education information will improve Description: Including pain rating scale, medication(s)/side effects and non-pharmacologic comfort measures Outcome: Progressing   Problem: Health Behavior/Discharge Planning: Goal: Ability to manage health-related needs will improve Outcome: Progressing   Problem: Clinical Measurements: Goal: Ability to maintain clinical measurements within normal limits will improve Outcome: Progressing Goal: Will remain free from infection Outcome: Progressing Goal: Diagnostic test results will improve Outcome: Progressing Goal: Respiratory complications will improve Outcome: Progressing Goal: Cardiovascular complication will be avoided Outcome: Progressing   Problem: Activity: Goal: Risk for activity intolerance will decrease Outcome: Progressing   Problem: Nutrition: Goal: Adequate nutrition will be maintained Outcome: Progressing   Problem: Coping: Goal: Level of anxiety will decrease Outcome: Progressing   Problem: Pain Managment: Goal: General experience of comfort will improve Outcome: Progressing   Problem: Skin Integrity: Goal: Risk for impaired skin integrity will decrease Outcome: Progressing   Problem: Education: Goal: Knowledge of risk factors and measures for prevention of condition will improve Outcome: Progressing   Problem: Coping: Goal: Psychosocial and spiritual needs will be supported Outcome: Progressing   Problem: Respiratory: Goal: Will maintain a patent airway Outcome: Progressing Goal: Complications related to the disease process, condition or treatment will be avoided or minimized Outcome: Progressing   

## 2020-03-27 NOTE — Progress Notes (Signed)
Cardiology Progress Note  Patient ID: Mike Moran MRN: [id number] DOB: 04-Mar-1955 Date of Encounter: 03/27/2020  Primary Cardiologist: No primary care provider on file.  Subjective   Chief Complaint: Dizziness  HPI: Continues to have paroxysmal AV block.  Longest up to 12.6 seconds at 03/27/2020 around 6:20 AM.  He reports any coughing spells can also be dizzy.  He had a coughing spell in the room and noted to have paroxysmal AV block.  No syncope.  Hemodynamically stable.  Echocardiogram with normal LV function.  ROS:  All other ROS reviewed and negative. Pertinent positives noted in the HPI.     Inpatient Medications  Scheduled Meds: . albuterol  2 puff Inhalation Q6H  . vitamin C  500 mg Oral Daily  . DULoxetine  60 mg Oral Daily  . insulin aspart  0-15 Units Subcutaneous TID WC  . insulin aspart  0-5 Units Subcutaneous QHS  . pantoprazole (PROTONIX) IV  40 mg Intravenous QHS  . phosphorus  250 mg Oral BID  . risperiDONE  0.5 mg Oral QHS  . sodium chloride flush  10 mL Intracatheter Q8H  . thiamine  100 mg Oral Daily   Or  . thiamine  100 mg Intravenous Daily  . traZODone  100 mg Oral QHS  . zinc sulfate  220 mg Oral Daily   Continuous Infusions: .  ceFAZolin (ANCEF) IV 2 g (03/27/20 0559)   PRN Meds: acetaminophen, guaiFENesin-dextromethorphan, hydrOXYzine, morphine injection, oxyCODONE   Vital Signs   Vitals:   03/26/20 1508 03/26/20 1606 03/26/20 2050 03/27/20 0452  BP: 92/64 102/66 94/66 103/73  Pulse: 73 77 90 93  Resp: 10 (!) Temp: 98.2 F (36.8 C) 98.3 F (36.8 C) 98.1 F (36.7 C) 98.6 F (37 C)  TempSrc: Oral Oral Oral Axillary  SpO2: 98% 97% 100% 97%  Weight:    58.8 kg  Height:        Intake/Output Summary (Last 24 hours) at 03/27/2020 1050 Last data filed at 03/27/2020 6578 Gross per 24 hour  Intake 320 ml  Output 375 ml  Net -55 ml   Last 3 Weights 03/27/2020 03/26/2020 03/25/2020  Weight (lbs) 129 lb 10.1 oz 130 lb 4.7 oz 140 lb   Weight (kg) 58.8 kg 59.1 kg 63.504 kg  Some encounter information is confidential and restricted. Go to Review Flowsheets activity to see all data.      Telemetry  Overnight telemetry shows paroxysmal AV block with lack of escape rhythm for up to 12.6 seconds at 6:20 AM on 03/27/2020, which I personally reviewed.   ECG  The most recent ECG shows sinus rhythm with PACs, narrow QRS, which I personally reviewed.   Physical Exam   Vitals:   03/26/20 1508 03/26/20 1606 03/26/20 2050 03/27/20 0452  BP: 92/64 102/66 94/66 103/73  Pulse: 73 77 90 93  Resp: 10 (!) Temp: 98.2 F (36.8 C) 98.3 F (36.8 C) 98.1 F (36.7 C) 98.6 F (37 C)  TempSrc: Oral Oral Oral Axillary  SpO2: 98% 97% 100% 97%  Weight:    58.8 kg  Height:         Intake/Output Summary (Last 24 hours) at 03/27/2020 1050 Last data filed at 03/27/2020 4696 Gross per 24 hour  Intake 320 ml  Output 375 ml  Net -55 ml    Last 3 Weights 03/27/2020 03/26/2020 03/25/2020  Weight (lbs) 129 lb 10.1 oz 130 lb 4.7 oz 140 lb  Weight (  kg) 58.8 kg 59.1 kg 63.504 kg  Some encounter information is confidential and restricted. Go to Review Flowsheets activity to see all data.    Body mass index is 17.58 kg/m.  General: Well nourished, well developed, in no acute distress Head: Atraumatic, normal size  Eyes: PEERLA, EOMI  Neck: Supple, no JVD Endocrine: No thryomegaly Cardiac: Normal S1, S2; RRR; no murmurs, rubs, or gallops Lungs: Clear to auscultation bilaterally, no wheezing, rhonchi or rales  Abd: Soft, nontender, no hepatomegaly  Ext: No edema, pulses 2+ Musculoskeletal: No deformities, BUE and BLE strength normal and equal Skin: Warm and dry, no rashes   Neuro: Alert and oriented to person, place, time, and situation, CNII-XII grossly intact, no focal deficits  Psych: Normal mood and affect   Labs  High Sensitivity Troponin:   Recent Labs  Lab 03/25/20 0205 03/25/20 0430  TROPONINIHS 4 5     Cardiac  EnzymesNo results for input(s): TROPONINI in the last 168 hours. No results for input(s): TROPIPOC in the last 168 hours.  Chemistry Recent Labs  Lab 03/25/20 0205 03/25/20 1409 03/26/20 0149 03/27/20 0037  NA 135  --  135 137  K 3.6  --  4.2 4.4  CL 101  --  102 101  CO2 26  --  23 26  GLUCOSE 133*  --  171* 131*  BUN 20  --  21 22  CREATININE 0.65  --  0.50* 0.62  CALCIUM 8.9  --  8.9 8.8*  PROT 6.7 6.3* 6.0* 5.7*  ALBUMIN 3.1*  --  2.5* 2.4*  AST 16  --  14* 38  ALT 14  --  14 35  ALKPHOS 50  --  58 51  BILITOT 0.5  --  0.6 0.4  GFRNONAA >60  --  >60 >60  ANIONGAP 8  --  10 10    Hematology Recent Labs  Lab 03/25/20 0205 03/26/20 0149 03/27/20 0037  WBC 29.5* 38.4* 29.4*  RBC 3.79* 3.95* 3.54*  HGB 11.6* 11.7* 10.9*  HCT 34.5* 35.6* 30.9*  MCV 91.0 90.1 87.3  MCH 30.6 29.6 30.8  MCHC 33.6 32.9 35.3  RDW 14.6 14.5 14.6  PLT 467* 468* 471*   BNP Recent Labs  Lab 03/25/20 0205  BNP 189.0*    DDimer  Recent Labs  Lab 03/25/20 0205 03/26/20 0149 03/27/20 0037  DDIMER 3.46* 3.82* 3.39*     Radiology  DG CHEST PORT 1 VIEW  Result Date: 03/27/2020 CLINICAL DATA:  Pleural effusion.  Chest tube. EXAM: PORTABLE CHEST 1 VIEW COMPARISON:  03/26/2020. FINDINGS: Right chest tube in stable position. No pneumothorax noted on today's exam. Stable right base and right upper medial loculated pleural effusions. Persistent bibasilar atelectasis. Heart size stable. IMPRESSION: Right chest tube in stable position. No pneumothorax noted on today's exam. Stable right base and right upper medial loculated pleural effusions. Electronically Signed   By: Maisie Fus  Register   On: 03/27/2020 07:24   DG CHEST PORT 1 VIEW  Result Date: 03/26/2020 CLINICAL DATA:  Pleural effusion.  Chest tube. EXAM: PORTABLE CHEST 1 VIEW COMPARISON:  Chest x-ray 03/25/2020.  CT chest 03/25/2020. FINDINGS: Right chest tube in stable position. Tiny right apical pneumothorax again noted without interim  change. Continued improvement in right base pleural effusion. Persistent right upper medial loculated pleural effusion again noted. Right base atelectasis/infiltrate. Heart size stable. IMPRESSION: 1. Right chest tube in stable position. Tiny right apical pneumothorax again noted without interim change. Continued improvement in right base pleural  effusion. Persistent right upper medial loculated pleural effusion again noted. 2.  Right base atelectasis/infiltrate. Electronically Signed   By: Maisie Fus  Register   On: 03/26/2020 07:05   DG CHEST PORT 1 VIEW  Result Date: 03/25/2020 CLINICAL DATA:  Chest tube. EXAM: PORTABLE CHEST 1 VIEW COMPARISON:  CT 03/25/2020.  Chest x-ray 03/25/2020. FINDINGS: Interim placement right chest tube. Interim significant improvement right-sided pleural effusion with mild to moderate residual. Loculated pleural fluid component appears to be present over the upper medial chest. Right base atelectasis. Heart size stable. IMPRESSION: Interim placement of right chest tube. Interim significant improvement of right-sided pleural effusion with mild to moderate residual. Loculated pleural fluid component appears to be present over the upper medial chest. Associated very tiny right apical pneumothorax cannot be excluded on today's exam. Critical Value/emergent results were called by telephone at the time of interpretation on 03/25/2020 at 1:27 pm to nurse Lenda Kelp, who verbally acknowledged these results. Electronically Signed   By: Maisie Fus  Register   On: 03/25/2020 13:28   ECHOCARDIOGRAM LIMITED  Result Date: 03/26/2020    ECHOCARDIOGRAM LIMITED REPORT   Patient Name:   Mike Moran Date of Exam: 03/26/2020 Medical Rec #:  [id number]      Height:       72.0 in Accession #:    [id number]     Weight:       130.3 lb Date of Birth:  Feb 03, 1956      BSA:          1.776 m Patient Age:    64 years       BP:           92/64 mmHg Patient Gender: M              HR:           73 bpm. Exam Location:   Inpatient Procedure: Limited Echo, Cardiac Doppler and Color Doppler Indications:    Bradycardia  History:        Patient has no prior history of Echocardiogram examinations.                 Arrythmias:Bradycardia; Signs/Symptoms:Shortness of Breath.                 Alcohol abuse, Loculated Right pleural effusion, chest tube                 present, COVID+.  Sonographer:    Lavenia Atlas Referring Phys: [id number] Ronnald Ramp O'NEAL IMPRESSIONS  1. Left ventricular ejection fraction, by estimation, is 55 to 60%. The left ventricle has normal function. The left ventricle has no regional wall motion abnormalities. There is mild left ventricular hypertrophy. Left ventricular diastolic parameters were normal.  2. Right ventricular systolic function is normal. The right ventricular size is mildly enlarged. Tricuspid regurgitation signal is inadequate for assessing PA pressure.  3. A small pericardial effusion is present.  4. The mitral valve is normal in structure. Trivial mitral valve regurgitation.  5. The aortic valve is tricuspid. Aortic valve regurgitation is not visualized. No aortic stenosis is present.  6. The inferior vena cava is normal in size with <50% respiratory variability, suggesting right atrial pressure of 8 mmHg. FINDINGS  Left Ventricle: Left ventricular ejection fraction, by estimation, is 55 to 60%. The left ventricle has normal function. The left ventricle has no regional wall motion abnormalities. The left ventricular internal cavity size was small. There is mild left ventricular hypertrophy. Left ventricular diastolic parameters were normal.  Right Ventricle: The right ventricular size is mildly enlarged. Right ventricular systolic function is normal. Tricuspid regurgitation signal is inadequate for assessing PA pressure. Pericardium: A small pericardial effusion is present. Mitral Valve: The mitral valve is normal in structure. Trivial mitral valve regurgitation. Tricuspid Valve: The tricuspid  valve is normal in structure. Tricuspid valve regurgitation is trivial. Aortic Valve: The aortic valve is tricuspid. Aortic valve regurgitation is not visualized. No aortic stenosis is present. Aorta: The aortic root is normal in size and structure. Venous: The inferior vena cava is normal in size with less than 50% respiratory variability, suggesting right atrial pressure of 8 mmHg. LEFT VENTRICLE PLAX 2D LVIDd:         3.60 cm  Diastology LVIDs:         2.70 cm  LV e' medial:    7.94 cm/s LV PW:         0.90 cm  LV E/e' medial:  8.8 LV IVS:        1.00 cm  LV e' lateral:   8.27 cm/s LVOT diam:     1.90 cm  LV E/e' lateral: 8.4 LVOT Area:     2.84 cm  RIGHT VENTRICLE RV S prime:     4.03 cm/s LEFT ATRIUM         Index LA diam:    2.60 cm 1.46 cm/m   AORTA Ao Root diam: 3.10 cm MITRAL VALVE MV Area (PHT): 4.89 cm    SHUNTS MV Decel Time: 155 msec    Systemic Diam: 1.90 cm MV E velocity: 69.80 cm/s MV A velocity: 56.10 cm/s MV E/A ratio:  1.24 Epifanio Lescheshristopher Schumann MD Electronically signed by Epifanio Lescheshristopher Schumann MD Signature Date/Time: 03/26/2020/11:38:24 PM    Final     Cardiac Studies  TTE 03/26/2020 1. Left ventricular ejection fraction, by estimation, is 55 to 60%. The  left ventricle has normal function. The left ventricle has no regional  wall motion abnormalities. There is mild left ventricular hypertrophy.  Left ventricular diastolic parameters  were normal.  2. Right ventricular systolic function is normal. The right ventricular  size is mildly enlarged. Tricuspid regurgitation signal is inadequate for  assessing PA pressure.  3. A small pericardial effusion is present.  4. The mitral valve is normal in structure. Trivial mitral valve  regurgitation.  5. The aortic valve is tricuspid. Aortic valve regurgitation is not  visualized. No aortic stenosis is present.  6. The inferior vena cava is normal in size with <50% respiratory  variability, suggesting right atrial pressure of 8  mmHg.   Patient Profile  Mike Moran is a 65 y.o. male with anxiety, depression, former tobacco abuse who was admitted on 03/25/2020 with COVID-19 pneumonia and right empyema. He is status post chest tube placement. Cardiology consulted on 03/25/2020 for paroxysmal AV block.   Assessment & Plan   1.  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  Occur with coughing spells occurred during my examination.  Also occur with pain. -On 03/26/2020 around 8:47 AM he had an episode of AV block without escape rhythm for up to 15 seconds -On 03/27/2020 around 6:20 AM he had an episode of AV block 12.6 seconds without an escape rhythm -He now reports he may be intermittently dizzy when he coughs and gets lightheaded. -Clearly appears to be vagally mediated.  He has PP interval prolongation without an escape rhythm when the events began.  This is more consistent with extrinsic vagal  response. -Baseline EKG with narrow QRS and no evidence of high-grade conduction disease.  Echo shows normal LV function and no evidence of valvular heart disease or infective endocarditis.  TSH was normal.  He is on no AV nodal agents. -If he needs any procedures or continues to have prolonged episodes with symptoms we would need to consider a temporary pacing device.  He has limited options for permanent pacing given his bacteremia empyema and Covid infection.  RV leadless pacer may be an option as well. -For now avoid AV nodal agents.  2.  Staph epidermidis bacteremia -Discussed with critical care medicine.  They have concerns for endocarditis.  I do not believe his heart conduction issues are related to endocarditis but I do agree that a TEE would be warranted to ensure there is no aortic root abscess or other infectious sources to explain his conduction disease. -We will plan for transesophageal echo once he is more stable and off isolation precautions.  For questions or updates,  please contact [facility] Please consult [email or web address] for contact info under   Time Spent with Patient: I have spent a total of 25 minutes with patient reviewing hospital notes, telemetry, EKGs, labs and examining the patient as well as establishing an assessment and plan that was discussed with the patient.  > 50% of time was spent in direct patient care.    Signed, Lenna Gilford. Flora Lipps, MD [facility]  [facility]  03/27/2020 10:50 AM

## 2020-03-27 NOTE — Progress Notes (Signed)
NAME:  Mike Moran, MRN:  [id number], DOB:  19-Jul-1955, LOS: 2 ADMISSION DATE:  03/25/2020, CONSULTATION DATE:  03/25/20 REFERRING MD:  Elgergawy, CHIEF COMPLAINT: loculated pleural effusion    Brief History:  65 yo M + COVID, loculated pleural effusion.   History of Present Illness:  65 yo M PMH Anxiety, Depression, EtOH use presented to [facility] 2/15 with SOB. SOB, cough, back pain on the right began 3 days prior to presentation. Associated body aches and weakness. [place] beach. Pt noted to be hypoxic by EMS and was taken to ED on O2. Tested positive for COVID. CTA chest obtained which revealed a large r pleural effusion, loculated in appearance. Transferred to [facility] 2/15. CRP 19.7 LDH 99 Ferritin 225 PCT 0.32 WBC 29.5.  PCCM consulted for management of loculated pleural effusion.  Past Medical History:  Depression  Anxiety   Significant Hospital Events:  2/15 presented to [facility] for SOB, hypoxia. COVID +. CTA reveals large pleural effusion. Transferred to [facility]. PCCM consulted for pleural effusion   Consults:  PCCM   Procedures:  2/15 Chest Tube >>>   Significant Diagnostic Tests:  2/15 CTA chest > no evidence of PE. Large R pleural effusion with loculation. RML RLL collapse, significant atelectasis. R airway debris. Small 52mm L apical nodule. 5.6cm R adrenal mass (seen on 2010 abdominal ultrasound)  Pleural fluid 2/15> (1075 WBCs, 85% PMNs; LDH 528, protein 4.3) Echo 03/26/20: LVEF 55-60%, mild LVH, normal diastolic function. RV mildly enlarged. Trivial MR & TR.   Micro Data:  2/15 COVID +  2/15 BCx>> GPC 4/4 (staph epi on biofire) 2/16 blood cultures >   Antimicrobials:  2/15 vanc> 2/15 cefepime> 2/15 Cefazolin  2/16 >  2/15 remdesivir>  Interim History / Subjective:  Pain better controlled, less SOB. Complains of chronic left shoulder pain, had a heat pack on it.  Objective   Blood pressure 103/73, pulse 93, temperature 98.6 F (37 C), temperature source  Axillary, resp. rate 14, height 6' (1.829 m), weight 58.8 kg, SpO2 97 %.        Intake/Output Summary (Last 24 hours) at 03/27/2020 1113 Last data filed at 03/27/2020 0240 Gross per 24 hour  Intake 320 ml  Output 375 ml  Net -55 ml   Filed Weights   03/25/20 0141 03/26/20 0500 03/27/20 0452  Weight: 63.5 kg 59.1 kg 58.8 kg    Examination: General: chronically ill appearing man, thin, laying in bed in NAD HENT: [place]/AT, eyes anictiric Lungs: About to get 1200cc with IS, CTAB. Breathing comfortably on .  Chest tube with blood-tinged output, ~840cc in atrium Cardiovascular: RRR, no murmurs Abdomen: thin, soft Extremities: no c/c/e Neuro: awake, alert, able to move in bed slowly, independently Derm: warm, dry, no rashes  Resolved Hospital Problem list     Assessment & Plan:   Acute respiratory failure with hypoxia Large R sided loculated pleural effusion COVID-19 infection Sepsis due to Staph epi bacteremia -Repeat blood cultures from 2/16 pending; had echo without obvious vegetation but without a known organism causing his pulmonary pathology, it is concerning for a hematogenous source of infection with staph epi, which is not a normal respiratory tract pathogen. -con't chest tube to -20cm H20 suction -TPA & Dnase daily x 3 days -chest CT to evaluate persistent pleural disease after third dose of TPA & DNASE -con't empiric antibiotics- vanc & cefazolin; pleural flud & blood cultures & sensitivities pending -airborne/contact precautions for covid -covid-19 therapies per primary team. Remdesivir, steroids,  Vit C, Zinc  -trending inflammatory markers -Supplemental O2 as needed, IS -oxycodone 7.5mg  Q6h PRN + morphine IV for breakthrough for pain control while chest tube remains in place  Depression Anxiety Sleep Disturbance -takes cymbalta, atarax, risperidol, trazodone at home  Best practice (evaluated daily)  Diet: NPO Pain/Anxiety/Delirium protocol (if indicated):  VAP  protocol (if indicated):  DVT prophylaxis: SCDs GI prophylaxis: na Glucose control: SSI Mobility: per primary Disposition: per primary  Goals of Care:  Last date of multidisciplinary goals of care discussion: Per primary Code Status: Full   Labs   CBC: Recent Labs  Lab 03/25/20 0205 03/26/20 0149 03/27/20 0037  WBC 29.5* 38.4* 29.4*  NEUTROABS 27.5* 35.9* 26.4*  HGB 11.6* 11.7* 10.9*  HCT 34.5* 35.6* 30.9*  MCV 91.0 90.1 87.3  PLT 467* 468* 471*    Basic Metabolic Panel: Recent Labs  Lab 03/25/20 0205 03/26/20 0149 03/27/20 0037  NA 135 135 137  K 3.6 4.2 4.4  CL 101 102 101  CO2 26 23 26   GLUCOSE 133* 171* 131*  BUN 20 21 22   CREATININE 0.65 0.50* 0.62  CALCIUM 8.9 8.9 8.8*  MG  --  1.8 2.1  PHOS  --  3.8 3.6   GFR: Estimated Creatinine Clearance: 75.5 mL/min (by C-G formula based on SCr of 0.62 mg/dL). Recent Labs  Lab 03/25/20 0205 03/25/20 0430 03/26/20 0149 03/27/20 0037  PROCALCITON 0.32  --   --   --   WBC 29.5*  --  38.4* 29.4*  LATICACIDVEN 1.7 0.6  --   --     Liver Function Tests: Recent Labs  Lab 03/25/20 0205 03/25/20 1409 03/26/20 0149 03/27/20 0037  AST 16  --  14* 38  ALT 14  --  14 35  ALKPHOS 50  --  58 51  BILITOT 0.5  --  0.6 0.4  PROT 6.7 6.3* 6.0* 5.7*  ALBUMIN 3.1*  --  2.5* 2.4*   Recent Labs  Lab 03/25/20 0205  LIPASE 26   No results for input(s): AMMONIA in the last 168 hours.  ABG No results found for: PHART, PCO2ART, PO2ART, HCO3, TCO2, ACIDBASEDEF, O2SAT   Coagulation Profile: No results for input(s): INR, PROTIME in the last 168 hours.  Cardiac Enzymes: No results for input(s): CKTOTAL, CKMB, CKMBINDEX, TROPONINI in the last 168 hours.  HbA1C: Hgb A1c MFr Bld  Date/Time Value Ref Range Status  01/10/2014 06:19 AM 5.6 <5.7 % Final    Comment:    (NOTE)                                                                       According to the ADA Clinical Practice Recommendations for 2011, when HbA1c  is used as a screening test:  >=6.5%   Diagnostic of Diabetes Mellitus           (if abnormal result is confirmed) 5.7-6.4%   Increased risk of developing Diabetes Mellitus References:Diagnosis and Classification of Diabetes Mellitus,Diabetes Care,2011,34(Suppl 1):S62-S69 and Standards of Medical Care in         Diabetes - 2011,Diabetes Care,2011,34 (Suppl 1):S11-S61.     CBG: Recent Labs  Lab 03/26/20 0831 03/26/20 1134 03/26/20 1714 03/26/20 2053 03/27/20 0744  GLUCAP 167* 154* 194*  200* 125*    Steffanie Dunn, DO 03/27/20 11:38 AM [facility]  From 7AM- 7PM if no response to pager, please call [phone]. After hours, 7PM- 7AM, please call Elink  [phone].

## 2020-03-27 NOTE — Progress Notes (Signed)
PROGRESS NOTE                                                                                                                                                                                                             Patient Demographics:    Mike Moran, is a 65 y.o. male, DOB - 05-Mar-1955, WFU:[id number]  Outpatient Primary MD for the patient is [name], MD   Admit date - 03/25/2020   LOS - 2  Chief Complaint  Patient presents with  . Shortness of Breath       Brief Narrative: Patient is a 65 y.o. male with PMHx of anxiety/depression-presented to [facility] on 2/15 with shortness of breath-found to have large right-sided loculated pleural effusion and COVID-19 infection.   COVID-19 vaccinated status: Unvaccinated  Significant Events: 2/15>> Admit to [facility] for shortness of breath-found to have a large right-sided loculated pleural effusion and COVID-19 infection  2/16 >> transferred to [facility]   Significant studies: 2/15>> chest x-ray: Large right-sided pleural effusion 2/15>> CTA chest: Large/loculated right pleural effusion with extensive pulmonary collapse.  5.6 right adrenal mass 2/16>> Echo: EF 55-60%-no obvious vegetation seen on valves.   COVID-19 medications: Steroids: 2/15>> 2/16 Remdesivir: 2/14>>2/17  Antibiotics: Vancomycin: 2/14>> 2/16 Cefepime: 2/14>>2/15 Ancef: 2/16>>  Microbiology data: 2/15 >>blood culture: Staph hominis, staph epidermidis 2/15>> pleural fluid culture: No growth  2/16>> blood culture: No growth  Procedures: 2/15>> chest tube insertion  Consults: CCM,cards  DVT prophylaxis: enoxaparin (LOVENOX) injection 30 mg Start: 03/27/20 1230 SCDs Start: 03/25/20 0502    Subjective:   Much more calmer today-apologized regarding his behavior yesterday.  No chest pain or shortness of breath.   Assessment  & Plan :   Acute Hypoxic Resp Failure due to loculated large right-sided  pleural effusion-likely empyema: Stable-chest tube remains in place-PCCM following-on fluid fibrinolytic therapy.  On IV Ancef-await further culture data.  Coag negative staph bacteremia: On IV Ancef-repeat cultures negative so far.  Echo without obvious vegetations.  Follow closely-I will touch base with infectious disease to see if further work-up is recommended.   Paroxysmal AV block: Continue telemetry monitoring-avoid AV nodal blocking agents-cardiology suspect that this is vagally mediated-if he becomes more symptomatic-cardiology thinking of placing a temporary pacer.    COVID-19 infection: Suspect mostly incidental finding-completed 3 days of Remdesivir.    Fever: afebrile O2  requirements:  SpO2: 97 % O2 Flow Rate (L/min): 2 L/min   COVID-19 Labs: Recent Labs    03/25/20 0205 03/26/20 0149 03/27/20 0037  DDIMER 3.46* 3.82* 3.39*  FERRITIN 225 283 412*  LDH 99  --   --   CRP 19.7* 18.7* 12.1*       Component Value Date/Time   BNP 189.0 (H) 03/25/2020 0205    Recent Labs  Lab 03/25/20 0205  PROCALCITON 0.32    Lab Results  Component Value Date   SARSCOV2NAA POSITIVE (A) 03/25/2020     Prone/Incentive Spirometry: encouraged incentive spirometry use 3-4/hour.  Elevated D-dimer: On prophylactic Lovenox-check lower extremity Doppler.  Right adrenal mass: Chronic-benign based on 2010 abdominal ultrasound  Alcohol abuse: Claims he quit 10 years back-watch closely-no signs of withdrawal.  3 mm left apical nodule: Stable for outpatient follow-up  Depression/anxiety: Stable today-continue trazodone/Cymbalta and Atarax.    ABG: No results found for: PHART, PCO2ART, PO2ART, HCO3, TCO2, ACIDBASEDEF, O2SAT  Vent Settings: N/A  Condition -Guarded  Family Communication  : Daughter-[place]-[phone]-left voicemail on 2/17  Code Status :  Full Code  Diet :  Diet Order            Diet regular Room service appropriate? Yes; Fluid consistency: Thin  Diet  effective now                  Disposition Plan  :   Status is: Inpatient  Remains inpatient appropriate because:Inpatient level of care appropriate due to severity of illness   Dispo: The patient is from: Home              Anticipated d/c is to: Home              Anticipated d/c date is: > 3 days              Patient currently is not medically stable to d/c.   Difficult to place patient No   Barriers to discharge: Empyema with bacteremia-chest tube in place-on IV antibiotics  Antimicorbials  :    Anti-infectives (From admission, onward)   Start     Dose/Rate Route Frequency Ordered Stop   03/26/20 1400  ceFAZolin (ANCEF) IVPB 2g/100 mL premix        2 g 200 mL/hr over 30 Minutes Intravenous Every 8 hours 03/26/20 1114     03/26/20 1000  remdesivir 100 mg in sodium chloride 0.9 % 100 mL IVPB        100 mg 200 mL/hr over 30 Minutes Intravenous Daily 03/25/20 0347 03/27/20 1027   03/25/20 2200  vancomycin (VANCOCIN) IVPB 1000 mg/200 mL premix  Status:  Discontinued        1,000 mg 200 mL/hr over 60 Minutes Intravenous Every 12 hours 03/25/20 0510 03/26/20 1114   03/25/20 1400  ceFEPIme (MAXIPIME) 2 g in sodium chloride 0.9 % 100 mL IVPB  Status:  Discontinued        2 g 200 mL/hr over 30 Minutes Intravenous Every 8 hours 03/25/20 0530 03/26/20 1114   03/25/20 0500  ceFEPIme (MAXIPIME) 2 g in sodium chloride 0.9 % 100 mL IVPB        2 g 200 mL/hr over 30 Minutes Intravenous  Once 03/25/20 0446 03/25/20 0638   03/25/20 0500  vancomycin (VANCOCIN) IVPB 1000 mg/200 mL premix        1,000 mg 200 mL/hr over 60 Minutes Intravenous  Once 03/25/20 0452 03/25/20 0723   03/25/20 0400  remdesivir 100  mg in sodium chloride 0.9 % 100 mL IVPB        100 mg 200 mL/hr over 30 Minutes Intravenous Every 30 min 03/25/20 0347 03/25/20 0527      Inpatient Medications  Scheduled Meds: . albuterol  2 puff Inhalation Q6H  . vitamin C  500 mg Oral Daily  . DULoxetine  60 mg Oral Daily  .  enoxaparin (LOVENOX) injection  30 mg Subcutaneous BID  . insulin aspart  0-15 Units Subcutaneous TID WC  . insulin aspart  0-5 Units Subcutaneous QHS  . pantoprazole (PROTONIX) IV  40 mg Intravenous QHS  . phosphorus  250 mg Oral BID  . risperiDONE  0.5 mg Oral QHS  . sodium chloride flush  10 mL Intracatheter Q8H  . thiamine  100 mg Oral Daily   Or  . thiamine  100 mg Intravenous Daily  . traZODone  100 mg Oral QHS  . zinc sulfate  220 mg Oral Daily   Continuous Infusions: .  ceFAZolin (ANCEF) IV 2 g (03/27/20 0559)   PRN Meds:.acetaminophen, guaiFENesin-dextromethorphan, hydrOXYzine, morphine injection, oxyCODONE   Time Spent in minutes  25  See all Orders from today for further details   Jeoffrey Massed M.D on 03/27/2020 at 12:11 PM  To page go to [email or web address] - use universal password  [facility] -  Office  [phone]    Objective:   Vitals:   03/26/20 1508 03/26/20 1606 03/26/20 2050 03/27/20 0452  BP: 92/64 102/66 94/66 103/73  Pulse: 73 77 90 93  Resp: 10 (!) 21 20 14   Temp: 98.2 F (36.8 C) 98.3 F (36.8 C) 98.1 F (36.7 C) 98.6 F (37 C)  TempSrc: Oral Oral Oral Axillary  SpO2: 98% 97% 100% 97%  Weight:    58.8 kg  Height:        Wt Readings from Last 3 Encounters:  03/27/20 58.8 kg  05/20/19 68 kg  11/23/17 75.8 kg     Intake/Output Summary (Last 24 hours) at 03/27/2020 1211 Last data filed at 03/27/2020 03/29/2020 Gross per 24 hour  Intake 320 ml  Output 375 ml  Net -55 ml     Physical Exam Gen Exam:Alert awake-not in any distress HEENT:atraumatic, normocephalic Chest: B/L clear to auscultation anteriorly CVS:S1S2 regular Abdomen:soft non tender, non distended Extremities:no edema Neurology: Non focal Skin: no rash   Data Review:    CBC Recent Labs  Lab 03/25/20 0205 03/26/20 0149 03/27/20 0037  WBC 29.5* 38.4* 29.4*  HGB 11.6* 11.7* 10.9*  HCT 34.5* 35.6* 30.9*  PLT 467* 468* 471*  MCV 91.0 90.1 87.3  MCH 30.6 29.6  30.8  MCHC 33.6 32.9 35.3  RDW 14.6 14.5 14.6  LYMPHSABS 0.5* 0.4* 0.8  MONOABS 1.3* 1.4* 1.8*  EOSABS 0.0 0.0 0.0  BASOSABS 0.1 0.1 0.1    Chemistries  Recent Labs  Lab 03/25/20 0205 03/26/20 0149 03/27/20 0037  NA 135 135 137  K 3.6 4.2 4.4  CL 101 102 101  CO2 26 23 26   GLUCOSE 133* 171* 131*  BUN 20 21 22   CREATININE 0.65 0.50* 0.62  CALCIUM 8.9 8.9 8.8*  MG  --  1.8 2.1  AST 16 14* 38  ALT 14 14 35  ALKPHOS 50 58 51  BILITOT 0.5 0.6 0.4   ------------------------------------------------------------------------------------------------------------------ Recent Labs    03/25/20 0205  TRIG 75    Lab Results  Component Value Date   HGBA1C 5.6 01/10/2014   ------------------------------------------------------------------------------------------------------------------ Recent Labs    03/26/20  0149  TSH 0.744   ------------------------------------------------------------------------------------------------------------------ Recent Labs    03/26/20 0149 03/27/20 0037  FERRITIN 283 412*    Coagulation profile No results for input(s): INR, PROTIME in the last 168 hours.  Recent Labs    03/26/20 0149 03/27/20 0037  DDIMER 3.82* 3.39*    Cardiac Enzymes No results for input(s): CKMB, TROPONINI, MYOGLOBIN in the last 168 hours.  Invalid input(s): CK ------------------------------------------------------------------------------------------------------------------    Component Value Date/Time   BNP 189.0 (H) 03/25/2020 0205    Micro Results Recent Results (from the past 240 hour(s))  Culture, blood (Routine X 2) w Reflex to ID Panel     Status: Abnormal (Preliminary result)   Collection Time: 03/25/20  2:05 AM   Specimen: Right Antecubital; Blood  Result Value Ref Range Status   Specimen Description   Final    RIGHT ANTECUBITAL Performed at [facility], [address], [place], [place] [address]    Special Requests   Final    BAA Blood  Culture adequate volume Performed at [facility], [address], [place], [place] [address]    Culture  Setup Time   Final    GRAM POSITIVE COCCI IN BOTH AEROBIC AND ANAEROBIC BOTTLES Gram Stain Report Called to,Read Back By and Verified With: KNECHT,LINDSAY @ [facility] 5 W @2327  BY MATTHEWS, B 2.15.22 Performed at [facility], [address], [place], [place] [address]    Culture (A)  Final    STAPHYLOCOCCUS HOMINIS STAPHYLOCOCCUS EPIDERMIDIS    Report Status PENDING  Incomplete  Resp Panel by RT-PCR (Flu A&B, Covid) Nasopharyngeal Swab     Status: Abnormal   Collection Time: 03/25/20  2:10 AM   Specimen: Nasopharyngeal Swab; Nasopharyngeal(NP) swabs in vial transport medium  Result Value Ref Range Status   SARS Coronavirus 2 by RT PCR POSITIVE (A) NEGATIVE Final    Comment: RESULT CALLED TO, READ BACK BY AND VERIFIED WITH: WALKER,T@0326  BY MATTHEWS,B 2.15.22 (NOTE) SARS-CoV-2 target nucleic acids are DETECTED.  The SARS-CoV-2 RNA is generally detectable in upper respiratory specimens during the acute phase of infection. Positive results are indicative of the presence of the identified virus, but do not rule out bacterial infection or co-infection with other pathogens not detected by the test. Clinical correlation with patient history and other diagnostic information is necessary to determine patient infection status. The expected result is Negative.  Fact Sheet for Patients: BloggerCourse.com  Fact Sheet for Healthcare Providers: SeriousBroker.it  This test is not yet approved or cleared by the [place] FDA and  has been authorized for detection and/or diagnosis of SARS-CoV-2 by FDA under an Emergency Use Authorization (EUA).  This EUA will remain in effect (meaning this test can  be used) for the duration of  the COVID-19 declaration under Section 564(b)(1) of the Act, 21 U.S.C. section 360bbb-3(b)(1), unless the  authorization is terminated or revoked sooner.     Influenza A by PCR NEGATIVE NEGATIVE Final   Influenza B by PCR NEGATIVE NEGATIVE Final    Comment: (NOTE) The Xpert Xpress SARS-CoV-2/FLU/RSV plus assay is intended as an aid in the diagnosis of influenza from Nasopharyngeal swab specimens and should not be used as a sole basis for treatment. Nasal washings and aspirates are unacceptable for Xpert Xpress SARS-CoV-2/FLU/RSV testing.  Fact Sheet for Patients: BloggerCourse.com  Fact Sheet for Healthcare Providers: SeriousBroker.it  This test is not yet approved or cleared by the [place] FDA and has been authorized for detection and/or diagnosis of SARS-CoV-2 by FDA  under an Emergency Use Authorization (EUA). This EUA will remain in effect (meaning this test can be used) for the duration of the COVID-19 declaration under Section 564(b)(1) of the Act, 21 U.S.C. section 360bbb-3(b)(1), unless the authorization is terminated or revoked.  Performed at [facility], [address], [place], [place] [address]   Culture, blood (Routine X 2) w Reflex to ID Panel     Status: Abnormal (Preliminary result)   Collection Time: 03/25/20  2:45 AM   Specimen: Right Antecubital; Blood  Result Value Ref Range Status   Specimen Description   Final    RIGHT ANTECUBITAL Performed at [facility], [address], [place], [place] [address]    Special Requests   Final    BAA Blood Culture adequate volume Performed at [facility], [address], [place], [place] [address]    Culture  Setup Time   Final    GRAM POSITIVE COCCI IN BOTH AEROBIC AND ANAEROBIC BOTTLES Gram Stain Report Called to,Read Back By and Verified With: [email or web address] 5w@2109  by matthews,b 2.15.22 Organism ID to follow CRITICAL RESULT CALLED TO, READ BACK BY AND VERIFIED WITH: J LEDFORD PHARMD 03/26/20 0451 JDW    Culture (A)  Final    STAPHYLOCOCCUS  HOMINIS STAPHYLOCOCCUS EPIDERMIDIS CULTURE REINCUBATED FOR BETTER GROWTH Performed at [facility], [address], [place], [place] [address]    Report Status PENDING  Incomplete  Blood Culture ID Panel (Reflexed)     Status: Abnormal   Collection Time: 03/25/20  2:45 AM  Result Value Ref Range Status   Enterococcus faecalis NOT DETECTED NOT DETECTED Final   Enterococcus Faecium NOT DETECTED NOT DETECTED Final   Listeria monocytogenes NOT DETECTED NOT DETECTED Final   Staphylococcus species DETECTED (A) NOT DETECTED Final    Comment: CRITICAL RESULT CALLED TO, READ BACK BY AND VERIFIED WITH: J LEDFORD PHARMD 03/26/20 0451 JDW    Staphylococcus aureus (BCID) NOT DETECTED NOT DETECTED Final   Staphylococcus epidermidis DETECTED (A) NOT DETECTED Final    Comment: CRITICAL RESULT CALLED TO, READ BACK BY AND VERIFIED WITH: J LEDFORD PHARMD 03/26/20 0451 JDW    Staphylococcus lugdunensis NOT DETECTED NOT DETECTED Final   Streptococcus species NOT DETECTED NOT DETECTED Final   Streptococcus agalactiae NOT DETECTED NOT DETECTED Final   Streptococcus pneumoniae NOT DETECTED NOT DETECTED Final   Streptococcus pyogenes NOT DETECTED NOT DETECTED Final   A.calcoaceticus-baumannii NOT DETECTED NOT DETECTED Final   Bacteroides fragilis NOT DETECTED NOT DETECTED Final   Enterobacterales NOT DETECTED NOT DETECTED Final   Enterobacter cloacae complex NOT DETECTED NOT DETECTED Final   Escherichia coli NOT DETECTED NOT DETECTED Final   Klebsiella aerogenes NOT DETECTED NOT DETECTED Final   Klebsiella oxytoca NOT DETECTED NOT DETECTED Final   Klebsiella pneumoniae NOT DETECTED NOT DETECTED Final   Proteus species NOT DETECTED NOT DETECTED Final   Salmonella species NOT DETECTED NOT DETECTED Final   Serratia marcescens NOT DETECTED NOT DETECTED Final   Haemophilus influenzae NOT DETECTED NOT DETECTED Final   Neisseria meningitidis NOT DETECTED NOT DETECTED Final   Pseudomonas aeruginosa NOT  DETECTED NOT DETECTED Final   Stenotrophomonas maltophilia NOT DETECTED NOT DETECTED Final   Candida albicans NOT DETECTED NOT DETECTED Final   Candida auris NOT DETECTED NOT DETECTED Final   Candida glabrata NOT DETECTED NOT DETECTED Final   Candida krusei NOT DETECTED NOT DETECTED Final   Candida parapsilosis NOT DETECTED NOT DETECTED Final   Candida tropicalis NOT DETECTED NOT DETECTED Final  Cryptococcus neoformans/gattii NOT DETECTED NOT DETECTED Final   Methicillin resistance mecA/C NOT DETECTED NOT DETECTED Final    Comment: Performed at [facility], [address] [place], [place] [address]  MRSA PCR Screening     Status: None   Collection Time: 03/25/20  9:59 AM   Specimen: Nasal Mucosa; Nasopharyngeal  Result Value Ref Range Status   MRSA by PCR NEGATIVE NEGATIVE Final    Comment:        The GeneXpert MRSA Assay (FDA approved for NASAL specimens only), is one component of a comprehensive MRSA colonization surveillance program. It is not intended to diagnose MRSA infection nor to guide or monitor treatment for MRSA infections. Performed at [facility], [address], [place], [place] [address]   Body fluid culture w Gram Stain     Status: None (Preliminary result)   Collection Time: 03/25/20  4:45 PM   Specimen: Pleura; Body Fluid  Result Value Ref Range Status   Specimen Description PLEURAL FLUID RIGHT  Final   Special Requests Normal  Final   Gram Stain   Final    RARE WBC PRESENT, PREDOMINANTLY MONONUCLEAR NO ORGANISMS SEEN    Culture   Final    NO GROWTH 2 DAYS Performed at [facility], [address], [place], [place] [address]    Report Status PENDING  Incomplete  Culture, blood (routine x 2)     Status: None (Preliminary result)   Collection Time: 03/26/20 11:00 AM   Specimen: BLOOD  Result Value Ref Range Status   Specimen Description BLOOD SITE NOT SPECIFIED  Final   Special Requests   Final    BOTTLES DRAWN AEROBIC ONLY Blood  Culture adequate volume   Culture   Final    NO GROWTH < 24 HOURS Performed at [facility], [address], [place], [place] [address]    Report Status PENDING  Incomplete  Culture, blood (routine x 2)     Status: None (Preliminary result)   Collection Time: 03/26/20 11:07 AM   Specimen: BLOOD LEFT ARM  Result Value Ref Range Status   Specimen Description BLOOD LEFT ARM  Final   Special Requests   Final    BOTTLES DRAWN AEROBIC ONLY Blood Culture results may not be optimal due to an inadequate volume of blood received in culture bottles   Culture   Final    NO GROWTH < 24 HOURS Performed at [facility], [address], [place], [place] [address]    Report Status PENDING  Incomplete    Radiology Reports CT ANGIO CHEST PE W OR WO CONTRAST  Result Date: 03/25/2020 CLINICAL DATA:  Shortness of breath. EXAM: CT ANGIOGRAPHY CHEST WITH CONTRAST TECHNIQUE: Multidetector CT imaging of the chest was performed using the standard protocol during bolus administration of intravenous contrast. Multiplanar CT image reconstructions and MIPs were obtained to evaluate the vascular anatomy. CONTRAST:  OMNIPAQUE IOHEXOL 350 MG/ML SOLN COMPARISON:  None. FINDINGS: Cardiovascular: Satisfactory opacification of the pulmonary arteries to the segmental level. No evidence of pulmonary embolism. Normal heart size. No pericardial effusion. Atheromatous calcification of the aorta Mediastinum/Nodes: No visible adenopathy or mass. Lungs/Pleura: Large right pleural effusion with loculation causing scalloping of the lung. The apicomedial opacity by radiography is also loculated pleural fluid. No visible mass. Extensive atelectasis in the right lung with collapsed lower and middle lobes. Secretions/debris seen in the central right-sided airways. Mild left-sided atelectasis. Tiny left apical pulmonary nodule  measuring 3 mm, which can be re-evaluated at follow-up. Upper Abdomen: Right adrenal mass which is  partially covered at 5.6 cm with low-density appearance and coarse calcifications. A right suprarenal mass of similar size was described on an abdominal ultrasound April 21, 2008. Musculoskeletal: No bony erosion adjacent to the pleural fluid. Review of the MIP images confirms the above findings. IMPRESSION: 1. Large and loculated right pleural effusion with extensive right pulmonary collapse. No visible underlying tumor; empyema is the leading consideration. 2. Partially covered 5.6 cm right adrenal mass, chronic and benign based on a 2010 abdominal ultrasound. Electronically Signed   By: Marnee Spring M.D.   On: 03/25/2020 04:20   DG CHEST PORT 1 VIEW  Result Date: 03/27/2020 CLINICAL DATA:  Pleural effusion.  Chest tube. EXAM: PORTABLE CHEST 1 VIEW COMPARISON:  03/26/2020. FINDINGS: Right chest tube in stable position. No pneumothorax noted on today's exam. Stable right base and right upper medial loculated pleural effusions. Persistent bibasilar atelectasis. Heart size stable. IMPRESSION: Right chest tube in stable position. No pneumothorax noted on today's exam. Stable right base and right upper medial loculated pleural effusions. Electronically Signed   By: Maisie Fus  Register   On: 03/27/2020 07:24   DG CHEST PORT 1 VIEW  Result Date: 03/26/2020 CLINICAL DATA:  Pleural effusion.  Chest tube. EXAM: PORTABLE CHEST 1 VIEW COMPARISON:  Chest x-ray 03/25/2020.  CT chest 03/25/2020. FINDINGS: Right chest tube in stable position. Tiny right apical pneumothorax again noted without interim change. Continued improvement in right base pleural effusion. Persistent right upper medial loculated pleural effusion again noted. Right base atelectasis/infiltrate. Heart size stable. IMPRESSION: 1. Right chest tube in stable position. Tiny right apical pneumothorax again noted without interim change. Continued improvement in right base pleural effusion. Persistent right upper medial loculated pleural effusion again noted.  2.  Right base atelectasis/infiltrate. Electronically Signed   By: Maisie Fus  Register   On: 03/26/2020 07:05   DG CHEST PORT 1 VIEW  Result Date: 03/25/2020 CLINICAL DATA:  Chest tube. EXAM: PORTABLE CHEST 1 VIEW COMPARISON:  CT 03/25/2020.  Chest x-ray 03/25/2020. FINDINGS: Interim placement right chest tube. Interim significant improvement right-sided pleural effusion with mild to moderate residual. Loculated pleural fluid component appears to be present over the upper medial chest. Right base atelectasis. Heart size stable. IMPRESSION: Interim placement of right chest tube. Interim significant improvement of right-sided pleural effusion with mild to moderate residual. Loculated pleural fluid component appears to be present over the upper medial chest. Associated very tiny right apical pneumothorax cannot be excluded on today's exam. Critical Value/emergent results were called by telephone at the time of interpretation on 03/25/2020 at 1:27 pm to nurse Lenda Kelp, who verbally acknowledged these results. Electronically Signed   By: Maisie Fus  Register   On: 03/25/2020 13:28   DG Chest Port 1 View  Result Date: 03/25/2020 CLINICAL DATA:  Cough and shortness of breath EXAM: PORTABLE CHEST 1 VIEW COMPARISON:  01/09/2014 FINDINGS: Large right pleural effusion. Basilar atelectasis. Left lung is clear. Medial opacity at the right lung apex. IMPRESSION: 1. Large right pleural effusion and basilar atelectasis. 2. Medial opacity at the right lung apex. CT recommended for better characterization. Electronically Signed   By: Deatra Robinson M.D.   On: 03/25/2020 02:13   ECHOCARDIOGRAM LIMITED  Result Date: 03/26/2020    ECHOCARDIOGRAM LIMITED REPORT   Patient Name:   Mike Moran Date of Exam: 03/26/2020 Medical Rec #:  [id number]      Height:  72.0 in Accession #:    [id number]     Weight:       130.3 lb Date of Birth:  15-Apr-1955      BSA:          1.776 m Patient Age:    64 years       BP:           92/64 mmHg  Patient Gender: M              HR:           73 bpm. Exam Location:  Inpatient Procedure: Limited Echo, Cardiac Doppler and Color Doppler Indications:    Bradycardia  History:        Patient has no prior history of Echocardiogram examinations.                 Arrythmias:Bradycardia; Signs/Symptoms:Shortness of Breath.                 Alcohol abuse, Loculated Right pleural effusion, chest tube                 present, COVID+.  Sonographer:    Lavenia AtlasBrooke Strickland Referring Phys: [id number] Ronnald RampWESLEY THOMAS O'NEAL IMPRESSIONS  1. Left ventricular ejection fraction, by estimation, is 55 to 60%. The left ventricle has normal function. The left ventricle has no regional wall motion abnormalities. There is mild left ventricular hypertrophy. Left ventricular diastolic parameters were normal.  2. Right ventricular systolic function is normal. The right ventricular size is mildly enlarged. Tricuspid regurgitation signal is inadequate for assessing PA pressure.  3. A small pericardial effusion is present.  4. The mitral valve is normal in structure. Trivial mitral valve regurgitation.  5. The aortic valve is tricuspid. Aortic valve regurgitation is not visualized. No aortic stenosis is present.  6. The inferior vena cava is normal in size with <50% respiratory variability, suggesting right atrial pressure of 8 mmHg. FINDINGS  Left Ventricle: Left ventricular ejection fraction, by estimation, is 55 to 60%. The left ventricle has normal function. The left ventricle has no regional wall motion abnormalities. The left ventricular internal cavity size was small. There is mild left ventricular hypertrophy. Left ventricular diastolic parameters were normal. Right Ventricle: The right ventricular size is mildly enlarged. Right ventricular systolic function is normal. Tricuspid regurgitation signal is inadequate for assessing PA pressure. Pericardium: A small pericardial effusion is present. Mitral Valve: The mitral valve is normal in  structure. Trivial mitral valve regurgitation. Tricuspid Valve: The tricuspid valve is normal in structure. Tricuspid valve regurgitation is trivial. Aortic Valve: The aortic valve is tricuspid. Aortic valve regurgitation is not visualized. No aortic stenosis is present. Aorta: The aortic root is normal in size and structure. Venous: The inferior vena cava is normal in size with less than 50% respiratory variability, suggesting right atrial pressure of 8 mmHg. LEFT VENTRICLE PLAX 2D LVIDd:         3.60 cm  Diastology LVIDs:         2.70 cm  LV e' medial:    7.94 cm/s LV PW:         0.90 cm  LV E/e' medial:  8.8 LV IVS:        1.00 cm  LV e' lateral:   8.27 cm/s LVOT diam:     1.90 cm  LV E/e' lateral: 8.4 LVOT Area:     2.84 cm  RIGHT VENTRICLE RV S prime:     4.03 cm/s LEFT ATRIUM  Index LA diam:    2.60 cm 1.46 cm/m   AORTA Ao Root diam: 3.10 cm MITRAL VALVE MV Area (PHT): 4.89 cm    SHUNTS MV Decel Time: 155 msec    Systemic Diam: 1.90 cm MV E velocity: 69.80 cm/s MV A velocity: 56.10 cm/s MV E/A ratio:  1.24 Epifanio Lesches MD Electronically signed by Epifanio Lesches MD Signature Date/Time: 03/26/2020/11:38:24 PM    Final

## 2020-03-27 NOTE — Progress Notes (Addendum)
Ok to add Lovenox 30mg  SQ BID for DVT prophylaxis with ddimer 3.39 per Dr .  Jerral Ralph, PharmD, BCIDP, AAHIVP, CPP Infectious Disease Pharmacist 03/27/2020 11:37 AM

## 2020-03-27 NOTE — Consult Note (Addendum)
Cardiology Consultation:   Patient ID: Mike Moran MRN: [id number]; DOB: 1955/12/31  Admit date: 03/25/2020 Date of Consult: 03/27/2020  PCP:  [name], MD   [facility]  Cardiologist:  New to [facility] :[id number]}    Patient Profile:   Mike Moran is a 65 y.o. male with a hx of anxiety, depression, former tobacco abuse, ETOH abusewho was admitted on2/15/2022 with COVID-19 pneumonia and right l;oculated effusion who is being seen today for the evaluation of transient CHB at the request of Dr. Flora Lipps.  History of Present Illness:   Mike Moran was admitted 01/22/21 for acute hypoxic respiratory failure 2/2 covid 19 PNA. He was found to have a large R sided loculated pleural effusion and was transferred to [facility] for evaluation. He has been started on Abx and Covid 19 PNA treatment,  underwent chest tube placement. Afterwards he developed intermittent AV block and cardiology was consulted.  He has continued to have these episodes, as long as 15 seconds that have been felt clearly to be provoked by vagal mechanism associated with coughing/pain and have been asymptomatic. Today the patient while Dr. Scharlene Gloss was with him coughed and had about an 8 second pause associated with some dizziness. EP was asked to weigh in.  LABS today K+ 4.4 Mag 2.1 BUN/Creat 22/0.62 WBC 29.4 (down some from yesterday) H/H 10.9/30.9 Plts 471   Past Medical History:  Diagnosis Date  . Anxiety   . Depression     History reviewed. No pertinent surgical history.   Home Medications:  Prior to Admission medications   Medication Sig Start Date End Date Taking? Authorizing Provider  aspirin EC 81 MG tablet Take 1 tablet (81 mg total) by mouth daily. For heart health 08/04/15  Yes Armandina Stammer I, NP  DULoxetine (CYMBALTA) 60 MG capsule Take 1 capsule (60 mg total) by mouth daily. 02/29/20  Yes Myrlene Broker, MD  hydrOXYzine (ATARAX/VISTARIL) 25 MG tablet Take 1 tablet (25 mg total)  by mouth every 6 (six) hours as needed. for anxiety 02/29/20  Yes Myrlene Broker, MD  Multiple Vitamins-Minerals (MULTIVITAMIN WITH MINERALS) tablet Take 1 tablet by mouth daily.   Yes [provider]  risperiDONE (RISPERDAL) 0.5 MG tablet TAKE (1) TABLET BY MOUTH AT BEDTIME. 02/29/20  Yes Myrlene Broker, MD  traZODone (DESYREL) 50 MG tablet Take 1 tablet (50 mg total) by mouth at bedtime as needed and may repeat dose one time if needed for sleep. 02/29/20  Yes Myrlene Broker, MD  doxycycline (VIBRAMYCIN) 100 MG capsule Take 1 capsule (100 mg total) by mouth 2 (two) times daily. Patient not taking: Reported on 03/25/2020 05/20/19   Pauline Aus, PA-C  triamcinolone cream (KENALOG) 0.1 % Apply 1 application topically 3 (three) times daily. To the affected area Patient not taking: Reported on 03/25/2020 05/20/19   Pauline Aus, PA-C    Inpatient Medications: Scheduled Meds: . albuterol  2 puff Inhalation Q6H  . vitamin C  500 mg Oral Daily  . DULoxetine  60 mg Oral Daily  . enoxaparin (LOVENOX) injection  30 mg Subcutaneous BID  . insulin aspart  0-15 Units Subcutaneous TID WC  . insulin aspart  0-5 Units Subcutaneous QHS  . pantoprazole (PROTONIX) IV  40 mg Intravenous QHS  . phosphorus  250 mg Oral BID  . risperiDONE  0.5 mg Oral QHS  . sodium chloride flush  10 mL Intracatheter Q8H  . thiamine  100 mg Oral Daily   Or  .  thiamine  100 mg Intravenous Daily  . traZODone  100 mg Oral QHS  . zinc sulfate  220 mg Oral Daily   Continuous Infusions: .  ceFAZolin (ANCEF) IV 2 g (03/27/20 1422)   PRN Meds: acetaminophen, guaiFENesin-dextromethorphan, hydrOXYzine, morphine injection, oxyCODONE  Allergies:    Allergies  Allergen Reactions  . Codeine Nausea And Vomiting  . Lexapro [Escitalopram Oxalate] Other (See Comments)    lethargic    Social History:   Social History   Socioeconomic History  . Marital status: Married    Spouse name: Not on file  . Number of  children: Not on file  . Years of education: Not on file  . Highest education level: Not on file  Occupational History  . Not on file  Tobacco Use  . Smoking status: Former Games developer  . Smokeless tobacco: Never Used  Substance and Sexual Activity  . Alcohol use: Yes    Alcohol/week: 0.0 standard drinks    Comment: occ- pt denies SA use  . Drug use: No  . Sexual activity: Not on file  Other Topics Concern  . Not on file  Social History Narrative  . Not on file   Social Determinants of Health   Financial Resource Strain: Not on file  Food Insecurity: Not on file  Transportation Needs: Not on file  Physical Activity: Not on file  Stress: Not on file  Social Connections: Not on file  Intimate Partner Violence: Not on file    Family History:   No noted cardiac family history  ROS:  Please see the history of present illness.  All other ROS reviewed and negative.     Physical Exam/Data:   Vitals:   03/26/20 1508 03/26/20 1606 03/26/20 2050 03/27/20 0452  BP: 92/64 102/66 94/66 103/73  Pulse: 73 77 90 93  Resp: 10 (!) 21 20 14   Temp: 98.2 F (36.8 C) 98.3 F (36.8 C) 98.1 F (36.7 C) 98.6 F (37 C)  TempSrc: Oral Oral Oral Axillary  SpO2: 98% 97% 100% 97%  Weight:    58.8 kg  Height:        Intake/Output Summary (Last 24 hours) at 03/27/2020 1450 Last data filed at 03/27/2020 03/29/2020 Gross per 24 hour  Intake 320 ml  Output 375 ml  Net -55 ml   Last 3 Weights 03/27/2020 03/26/2020 03/25/2020  Weight (lbs) 129 lb 10.1 oz 130 lb 4.7 oz 140 lb  Weight (kg) 58.8 kg 59.1 kg 63.504 kg  Some encounter information is confidential and restricted. Go to Review Flowsheets activity to see all data.     Body mass index is 17.58 kg/m.  The patient was not examined given CVID + status   EKG:  The EKG was personally reviewed and demonstrates:    SR 91bpm, PR 03/27/2020, QRS 46ms SR 76bpm, QRS 100  Telemetry:  Telemetry was personally reviewed and demonstrates:   SR with  intermittent v pauses associated with CHP, proceeded by P>P prolongation    Relevant CV Studies:  TTE 03/26/2020 1. Left ventricular ejection fraction, by estimation, is 55 to 60%. The  left ventricle has normal function. The left ventricle has no regional  wall motion abnormalities. There is mild left ventricular hypertrophy.  Left ventricular diastolic parameters  were normal.  2. Right ventricular systolic function is normal. The right ventricular  size is mildly enlarged. Tricuspid regurgitation signal is inadequate for  assessing PA pressure.  3. A small pericardial effusion is present.  4. The mitral  valve is normal in structure. Trivial mitral valve  regurgitation.  5. The aortic valve is tricuspid. Aortic valve regurgitation is not  visualized. No aortic stenosis is present.  6. The inferior vena cava is normal in size with <50% respiratory  variability, suggesting right atrial pressure of 8 mmHg.   Laboratory Data:  High Sensitivity Troponin:   Recent Labs  Lab 03/25/20 0205 03/25/20 0430  TROPONINIHS 4 5     Chemistry Recent Labs  Lab 03/25/20 0205 03/26/20 0149 03/27/20 0037  NA 135 135 137  K 3.6 4.2 4.4  CL 101 102 101  CO2 26 23 26   GLUCOSE 133* 171* 131*  BUN 20 21 22   CREATININE 0.65 0.50* 0.62  CALCIUM 8.9 8.9 8.8*  GFRNONAA >60 >60 >60  ANIONGAP 8 10 10     Recent Labs  Lab 03/25/20 0205 03/25/20 1409 03/26/20 0149 03/27/20 0037  PROT 6.7 6.3* 6.0* 5.7*  ALBUMIN 3.1*  --  2.5* 2.4*  AST 16  --  14* 38  ALT 14  --  14 35  ALKPHOS 50  --  58 51  BILITOT 0.5  --  0.6 0.4   Hematology Recent Labs  Lab 03/25/20 0205 03/26/20 0149 03/27/20 0037  WBC 29.5* 38.4* 29.4*  RBC 3.79* 3.95* 3.54*  HGB 11.6* 11.7* 10.9*  HCT 34.5* 35.6* 30.9*  MCV 91.0 90.1 87.3  MCH 30.6 29.6 30.8  MCHC 33.6 32.9 35.3  RDW 14.6 14.5 14.6  PLT 467* 468* 471*   BNP Recent Labs  Lab 03/25/20 0205  BNP 189.0*    DDimer  Recent Labs  Lab  03/25/20 0205 03/26/20 0149 03/27/20 0037  DDIMER 3.46* 3.82* 3.39*     Radiology/Studies:  CT ANGIO CHEST PE W OR WO CONTRAST Result Date: 03/25/2020 CLINICAL DATA:  Shortness of breath. EXAM: CT ANGIOGRAPHY CHEST WITH CONTRAST TECHNIQUE: Multidetector CT imaging of the chest was performed using the standard protocol during bolus administration of intravenous contrast. Multiplanar CT image reconstructions and MIPs were obtained to evaluate the vascular anatomy. CONTRAST:  100mL OMNIPAQUE IOHEXOL 350 MG/ML SOLN COMPARISON:  None. FINDINGS: Cardiovascular: Satisfactory opacification of the pulmonary arteries to the segmental level. No evidence of pulmonary embolism. Normal heart size. No pericardial effusion. Atheromatous calcification of the aorta Mediastinum/Nodes: No visible adenopathy or mass. Lungs/Pleura: Large right pleural effusion with loculation causing scalloping of the lung. The apicomedial opacity by radiography is also loculated pleural fluid. No visible mass. Extensive atelectasis in the right lung with collapsed lower and middle lobes. Secretions/debris seen in the central right-sided airways. Mild left-sided atelectasis. Tiny left apical pulmonary nodule measuring 3 mm, which can be re-evaluated at follow-up. Upper Abdomen: Right adrenal mass which is partially covered at 5.6 cm with low-density appearance and coarse calcifications. A right suprarenal mass of similar size was described on an abdominal ultrasound April 21, 2008. Musculoskeletal: No bony erosion adjacent to the pleural fluid. Review of the MIP images confirms the above findings. IMPRESSION: 1. Large and loculated right pleural effusion with extensive right pulmonary collapse. No visible underlying tumor; empyema is the leading consideration. 2. Partially covered 5.6 cm right adrenal mass, chronic and benign based on a 2010 abdominal ultrasound. Electronically Signed   By: Marnee SpringJonathon  Watts M.D.   On: 03/25/2020 04:20    DG  CHEST PORT 1 VIEW Result Date: 03/27/2020 CLINICAL DATA:  Pleural effusion.  Chest tube. EXAM: PORTABLE CHEST 1 VIEW COMPARISON:  03/26/2020. FINDINGS: Right chest tube in stable position. No pneumothorax noted  on today's exam. Stable right base and right upper medial loculated pleural effusions. Persistent bibasilar atelectasis. Heart size stable. IMPRESSION: Right chest tube in stable position. No pneumothorax noted on today's exam. Stable right base and right upper medial loculated pleural effusions. Electronically Signed   By: Maisie Fus  Register   On: 03/27/2020 07:24     Assessment and Plan:   1. Transient V pauses w/CHB as long as 15seconds     These have been seen with P-P prolongation and witnessed associated with coughing/pain, vagally mediated episodes      His baseline EKG has no evidence of conduction system disease and a narrow QRS  He has: step epidermis bacteremia Severe symptomatic COVID illness w/empyema, chest tube  2/15 >>blood culture: Staph hominis, staph epidermidis (repeat cultures drawn yesterday, pending <24hours to date) 2/15>> pleural fluid culture: No growth  2/16>> blood culture: No growth   Consult is done by chart review, discussion with the attending cardiologist, and review of telemetry  Discussed with Dr. Johney Frame No indication for pacing at this time, events are vagally mediated Avoid nodal blocking agents Keep pacing pads on the patient Atropine available at bedside  Once illness is resolved, CT out, and better clinically from his acute illness would expect the pauses to resolve  Dr. Jenel Lucks final recommendations to follow    Risk Assessment/Risk Scores:  { For questions or updates, please contact [facility] Please consult [email or web address] for contact info under    Signed, Sheilah Pigeon, PA-C  03/27/2020 2:50 PM   I have reviewed the above assessment and plan and discussed with Francis Dowse and Dr Scharlene Gloss.  Changes to above are made where  necessary.    The patient has been observed to have transient complete heart block in the setting of cough and pain.  This is vagally mediated AV block and should resolve as his clinical picture improves.  He is not a candidate for EP procedures. As he does not appear to have nocturnal bradycardia or any episodes outside of vagal triggers, I would NOT anticipate that he would require temporary pacing with surgery.  Electrophysiology team to see as needed while here. Please call with questions.   Co Sign: Hillis Range, MD 03/27/2020 9:42 PM

## 2020-03-27 NOTE — Progress Notes (Signed)
Lower extremity venous attempted. Patient with physical therapy. Will attempt again as schedule and patient availability permits.   Jean Rosenthal, RDMS

## 2020-03-27 NOTE — Procedures (Signed)
Pleural Fibrinolytic Administration Procedure Note  KARNELL VANDERLOOP  [id number]  06-Jun-1955  Date:03/27/20  Time:11:11 AM   Provider Performing:Sharisse Rantz P [facility]   Procedure: Pleural Fibrinolysis Subsequent day [phone])  Indication(s) Fibrinolysis of complicated pleural effusion  Consent Risks of the procedure as well as the alternatives and risks of each were explained to the patient and/or caregiver.  Consent for the procedure was obtained.   Anesthesia None   Time Out Verified patient identification, verified procedure, site/side was marked, verified correct patient position, special equipment/implants available, medications/allergies/relevant history reviewed, required imaging and test results available.   Sterile Technique Hand hygiene, gloves   Procedure Description Existing pleural catheter was cleaned and accessed in sterile manner.  10mg  of tPA in 30cc of saline and 5mg  of dornase in 30cc of sterile water were injected into pleural space using existing pleural catheter.  Catheter will be clamped for 1 hour and then placed back to suction. RN present and will release meds at noon.  ~840cc in atrium when meds were instilled and tube clamped.   Complications/Tolerance None; patient tolerated the procedure well.   EBL None   Specimen(s) None  , DO 03/27/20 11:11 AM [facility]  From 7AM- 7PM if no response to pager, please call [phone]. After hours, 7PM- 7AM, please call Elink  [phone].

## 2020-03-27 NOTE — Evaluation (Signed)
Physical Therapy Evaluation Patient Details Name: Mike Moran MRN: [id number] DOB: 05/27/1955 Today's Date: 03/27/2020   History of Present Illness  65 y.o. male with PMHx of anxiety/depression-presented to [facility] on 2/15 with shortness of breath-found to have large right-sided loculated pleural effusion and COVID-19 infection. Pt underwent chest tube placement on 2/15. Pt also with up to 15 second episodes of AV node block.  Clinical Impression  Pt presents to PT with deficits in activity tolerance. Mobility is limited due to pt noncompliance with PT requests as well as line management. Pt does report SOB with mobility standing at edge of bed, and fatigues quickly with activity. Due to unreliable waveform on pulse ox pt will benefit from further assessment of supplemental oxygen needs next session. PT anticipates the patient will progress well and not require PT services at the time of discharge.    Follow Up Recommendations No PT follow up    Equipment Recommendations  None recommended by PT    Recommendations for Other Services       Precautions / Restrictions Precautions Precautions: Fall Restrictions Weight Bearing Restrictions: No      Mobility  Bed Mobility Overal bed mobility: Modified Independent             General bed mobility comments: increased time    Transfers Overall transfer level: Independent Equipment used: None                Ambulation/Gait Ambulation/Gait assistance: Supervision Gait Distance (Feet): 5 Feet (pt marches in place for 10-20 steps, moves 5' in all directions at edge of bed) Assistive device: None Gait Pattern/deviations: WFL(Within Functional Limits) Gait velocity: functional Gait velocity interpretation: 1.31 - 2.62 ft/sec, indicative of limited community ambulator General Gait Details: pt dances at edge of bed as well as ambulates forward, backward, and to both sides. PT supervision for line management  Stairs             Wheelchair Mobility    Modified Rankin (Stroke Patients Only)       Balance Overall balance assessment: Mild deficits observed, not formally tested                                           Pertinent Vitals/Pain Pain Assessment: 0-10 Pain Score: 7  Pain Location: chest tube site Pain Descriptors / Indicators: Aching Pain Intervention(s): Monitored during session    Home Living Family/patient expects to be discharged to:: Private residence Living Arrangements: Spouse/significant other Available Help at Discharge: Family;Available 24 hours/day Type of Home: House Home Access: Level entry     Home Layout: One level Home Equipment: None      Prior Function Level of Independence: Independent         Comments: driving     Hand Dominance        Extremity/Trunk Assessment   Upper Extremity Assessment Upper Extremity Assessment: Overall WFL for tasks assessed    Lower Extremity Assessment Lower Extremity Assessment: Overall WFL for tasks assessed    Cervical / Trunk Assessment Cervical / Trunk Assessment: Normal  Communication   Communication: HOH  Cognition Arousal/Alertness: Awake/alert Behavior During Therapy: WFL for tasks assessed/performed Overall Cognitive Status: Within Functional Limits for tasks assessed  General Comments General comments (skin integrity, edema, etc.): pt with supplemental O2 running at 2L from wall but without nasal canula in. Pt sats in high 90s at rest on RA, pt reports SOB with mobility, PT observes increased work of breathing at end of session upon return to supine. Pt sats with unrelaible waveform throughout session, pt rips pulse ox off ear at one point during session. SpO2 with mobility will requrie further assessment next session    Exercises     Assessment/Plan    PT Assessment Patient needs continued PT services  PT Problem List Decreased  activity tolerance;Cardiopulmonary status limiting activity       PT Treatment Interventions Gait training;Stair training;Functional mobility training;Therapeutic exercise;Therapeutic activities;Patient/family education    PT Goals (Current goals can be found in the Care Plan section)  Acute Rehab PT Goals Patient Stated Goal: to go home and return to independence PT Goal Formulation: With patient Time For Goal Achievement: 04/10/20 Potential to Achieve Goals: Good Additional Goals Additional Goal #1: Pt will ambulate for community distances with BORG dypnea rating of 3/10 or less    Frequency Min 3X/week   Barriers to discharge        Co-evaluation               AM-PAC PT "6 Clicks" Mobility  Outcome Measure Help needed turning from your back to your side while in a flat bed without using bedrails?: None Help needed moving from lying on your back to sitting on the side of a flat bed without using bedrails?: None Help needed moving to and from a bed to a chair (including a wheelchair)?: None Help needed standing up from a chair using your arms (e.g., wheelchair or bedside chair)?: None Help needed to walk in hospital room?: A Little Help needed climbing 3-5 steps with a railing? : A Little 6 Click Score: 22    End of Session   Activity Tolerance: Patient limited by fatigue Patient left: in bed;with call bell/phone within reach;with bed alarm set Nurse Communication: Mobility status PT Visit Diagnosis: Other abnormalities of gait and mobility (R26.89)    Time: 2025-4270 PT Time Calculation (min) (ACUTE ONLY): 35 min   Charges:   PT Evaluation $PT Eval Moderate Complexity: 1 Mod          Arlyss Gandy, PT, DPT Acute Rehabilitation Pager: [phone]   Arlyss Gandy 03/27/2020, 5:09 PM

## 2020-03-28 ENCOUNTER — Inpatient Hospital Stay (HOSPITAL_COMMUNITY): Payer: Medicare Other

## 2020-03-28 DIAGNOSIS — B957 Other staphylococcus as the cause of diseases classified elsewhere: Secondary | ICD-10-CM

## 2020-03-28 DIAGNOSIS — U071 COVID-19: Secondary | ICD-10-CM

## 2020-03-28 DIAGNOSIS — R7881 Bacteremia: Secondary | ICD-10-CM

## 2020-03-28 DIAGNOSIS — R7989 Other specified abnormal findings of blood chemistry: Secondary | ICD-10-CM | POA: Diagnosis not present

## 2020-03-28 DIAGNOSIS — J90 Pleural effusion, not elsewhere classified: Secondary | ICD-10-CM

## 2020-03-28 DIAGNOSIS — A419 Sepsis, unspecified organism: Secondary | ICD-10-CM

## 2020-03-28 DIAGNOSIS — K2289 Other specified disease of esophagus: Secondary | ICD-10-CM

## 2020-03-28 DIAGNOSIS — Z9689 Presence of other specified functional implants: Secondary | ICD-10-CM

## 2020-03-28 DIAGNOSIS — R6520 Severe sepsis without septic shock: Secondary | ICD-10-CM

## 2020-03-28 DIAGNOSIS — J1282 Pneumonia due to coronavirus disease 2019: Secondary | ICD-10-CM | POA: Diagnosis not present

## 2020-03-28 LAB — COMPREHENSIVE METABOLIC PANEL
ALT: 34 U/L (ref 0–44)
AST: 25 U/L (ref 15–41)
Albumin: 2.3 g/dL — ABNORMAL LOW (ref 3.5–5.0)
Alkaline Phosphatase: 45 U/L (ref 38–126)
Anion gap: 7 (ref 5–15)
BUN: 20 mg/dL (ref 8–23)
CO2: 28 mmol/L (ref 22–32)
Calcium: 8.2 mg/dL — ABNORMAL LOW (ref 8.9–10.3)
Chloride: 99 mmol/L (ref 98–111)
Creatinine, Ser: 0.57 mg/dL — ABNORMAL LOW (ref 0.61–1.24)
GFR, Estimated: >60 mL/min (ref >60)
Glucose, Bld: 105 mg/dL — ABNORMAL HIGH (ref 70–99)
Potassium: 3.9 mmol/L (ref 3.5–5.1)
Sodium: 134 mmol/L — ABNORMAL LOW (ref 135–145)
Total Bilirubin: 0.8 mg/dL (ref 0.3–1.2)
Total Protein: 5.3 g/dL — ABNORMAL LOW (ref 6.5–8.1)

## 2020-03-28 LAB — CBC WITH DIFFERENTIAL/PLATELET
Abs Immature Granulocytes: 0.12 10*3/uL — ABNORMAL HIGH (ref 0.00–0.07)
Basophils Absolute: 0 10*3/uL (ref 0.0–0.1)
Basophils Relative: 0 %
Eosinophils Absolute: 0.1 10*3/uL (ref 0.0–0.5)
Eosinophils Relative: 0 %
HCT: 33.4 % — ABNORMAL LOW (ref 39.0–52.0)
Hemoglobin: 11.8 g/dL — ABNORMAL LOW (ref 13.0–17.0)
Immature Granulocytes: 1 %
Lymphocytes Relative: 9 %
Lymphs Abs: 1.5 10*3/uL (ref 0.7–4.0)
MCH: 31 pg (ref 26.0–34.0)
MCHC: 35.3 g/dL (ref 30.0–36.0)
MCV: 87.7 fL (ref 80.0–100.0)
Monocytes Absolute: 1.5 10*3/uL — ABNORMAL HIGH (ref 0.1–1.0)
Monocytes Relative: 10 %
Neutro Abs: 12.6 10*3/uL — ABNORMAL HIGH (ref 1.7–7.7)
Neutrophils Relative %: 80 %
Platelets: 463 10*3/uL — ABNORMAL HIGH (ref 150–400)
RBC: 3.81 MIL/uL — ABNORMAL LOW (ref 4.22–5.81)
RDW: 14.3 % (ref 11.5–15.5)
WBC: 15.8 10*3/uL — ABNORMAL HIGH (ref 4.0–10.5)
nRBC: 0 % (ref 0.0–0.2)

## 2020-03-28 LAB — C-REACTIVE PROTEIN: CRP: 7.4 mg/dL — ABNORMAL HIGH (ref <1.0)

## 2020-03-28 LAB — BODY FLUID CULTURE W GRAM STAIN
Culture: NO GROWTH
Special Requests: NORMAL

## 2020-03-28 LAB — FERRITIN: Ferritin: 331 ng/mL (ref 24–336)

## 2020-03-28 LAB — GLUCOSE, CAPILLARY
Glucose-Capillary: 135 mg/dL — ABNORMAL HIGH (ref 70–99)
Glucose-Capillary: 117 mg/dL — ABNORMAL HIGH (ref 70–99)
Glucose-Capillary: 125 mg/dL — ABNORMAL HIGH (ref 70–99)

## 2020-03-28 LAB — MAGNESIUM: Magnesium: 1.9 mg/dL (ref 1.7–2.4)

## 2020-03-28 LAB — PHOSPHORUS: Phosphorus: 3.5 mg/dL (ref 2.5–4.6)

## 2020-03-28 LAB — D-DIMER, QUANTITATIVE: D-Dimer, Quant: 2.66 ug/mL-FEU — ABNORMAL HIGH (ref 0.00–0.50)

## 2020-03-28 MED ORDER — SENNA 8.6 MG PO TABS: 2.0000 | ORAL_TABLET | Freq: Every day | ORAL | Status: DC

## 2020-03-28 MED ORDER — BISACODYL 5 MG PO TBEC
5.0000 mg | DELAYED_RELEASE_TABLET | Freq: Every day | ORAL | Status: DC | PRN
  Administered 2020-03-28: 5 mg via ORAL
  Filled 2020-03-28: qty 1

## 2020-03-28 MED ORDER — PANTOPRAZOLE SODIUM 40 MG PO TBEC
40.0000 mg | DELAYED_RELEASE_TABLET | Freq: Every day | ORAL | Status: DC
  Administered 2020-03-28 – 2020-03-29 (×2): 40 mg via ORAL
  Filled 2020-03-28 (×2): qty 1

## 2020-03-28 MED ORDER — IOHEXOL 300 MG/ML  SOLN
50.0000 mL | Freq: Once | INTRAMUSCULAR | Status: AC | PRN
  Administered 2020-03-28: 25 mL via ORAL

## 2020-03-28 MED ORDER — MAGNESIUM SULFATE 2 GM/50ML IV SOLN
2.0000 g | Freq: Once | INTRAVENOUS | Status: AC
  Administered 2020-03-28: 2 g via INTRAVENOUS
  Filled 2020-03-28: qty 50

## 2020-03-28 MED ORDER — POLYETHYLENE GLYCOL 3350 17 G PO PACK
17.0000 g | PACK | Freq: Four times a day (QID) | ORAL | Status: AC
  Administered 2020-03-28: 17 g via ORAL
  Filled 2020-03-28: qty 1

## 2020-03-28 MED ORDER — SODIUM CHLORIDE 0.9 % IV SOLN
3.0000 g | Freq: Four times a day (QID) | INTRAVENOUS | Status: DC
  Administered 2020-03-28 – 2020-03-30 (×7): 3 g via INTRAVENOUS
  Filled 2020-03-28 (×3): qty 8
  Filled 2020-03-28: qty 3
  Filled 2020-03-28 (×5): qty 8
  Filled 2020-03-28 (×3): qty 3
  Filled 2020-03-28: qty 8

## 2020-03-28 MED ORDER — ONDANSETRON HCL 4 MG/2ML IJ SOLN
4.0000 mg | Freq: Four times a day (QID) | INTRAMUSCULAR | Status: DC | PRN
  Administered 2020-03-28: 4 mg via INTRAVENOUS
  Filled 2020-03-28: qty 2

## 2020-03-28 MED ORDER — POLYETHYLENE GLYCOL 3350 17 G PO PACK
17.0000 g | PACK | Freq: Every day | ORAL | Status: DC
  Administered 2020-03-29 – 2020-03-30 (×2): 17 g via ORAL
  Filled 2020-03-28 (×2): qty 1

## 2020-03-28 MED ORDER — BISACODYL 10 MG RE SUPP
10.0000 mg | Freq: Every day | RECTAL | Status: DC | PRN
  Administered 2020-03-29: 10 mg via RECTAL
  Filled 2020-03-28: qty 1

## 2020-03-28 MED ORDER — SENNOSIDES-DOCUSATE SODIUM 8.6-50 MG PO TABS
2.0000 | ORAL_TABLET | Freq: Once | ORAL | Status: DC
  Filled 2020-03-28 (×2): qty 2

## 2020-03-28 NOTE — Consult Note (Signed)
[address]       [place] [id number]             [phone]     Reason for Consult: Loculated pleural effusion in the setting of Covid-19 pneumonia Referring Physician: Pulmonary critical care medicine  Mike Moran is an 65 y.o. male.  HPI: We are asked to see this 65 year old male with past medical history that includes anxiety and depression, alcohol abuse who presented to [facility] emergency department on 03/25/2020 with shortness of breath and cough as well as back pain that began approximately 3 days previously.  Symptoms [place] and on presentation in the ER he was hypoxic.  Additionally he had associated body aches and weakness and tested positive for Covid-19.  A CTA of the chest revealed a large right loculated pleural effusion.  He was transferred to [facility] on 11/22/2020 for higher level of care.  Other CTA findings were notable for no evidence of PE but significant right middle and lower lobe collapse with associated atelectasis as well as right air debris.  He is also noted to have a small left apical nodule measuring approximately 3 mm and a 5.6 cm right adrenal mass which was also present in 2010 on an abdominal ultrasound.  He was started on broad-spectrum antibiotics as well as steroids remdesivir.  A right chest tube is in place and most recent chest x-ray shows no pneumothorax and effusion findings are stable. Follow up ct chest was done this am.  We are asked to see in consideration of potential surgical intervention.      Past Medical History:  Diagnosis Date  . Anxiety   . Depression     History reviewed. No pertinent surgical history.  History reviewed. No pertinent family history.  Social History:  reports that he has quit smoking. He has never used smokeless tobacco. He reports current alcohol use. He reports that he does not use drugs.  Allergies:  Allergies  Allergen Reactions  . Codeine Nausea  And Vomiting  . Lexapro [Escitalopram Oxalate] Other (See Comments)    lethargic    Medications: I have reviewed the patient's current medications.  Results for orders placed or performed during the hospital encounter of 03/25/20 (from the past 48 hour(s))  Culture, blood (routine x 2)     Status: None (Preliminary result)   Collection Time: 03/26/20 11:00 AM   Specimen: BLOOD  Result Value Ref Range   Specimen Description BLOOD SITE NOT SPECIFIED    Special Requests      BOTTLES DRAWN AEROBIC ONLY Blood Culture adequate volume   Culture      NO GROWTH < 24 HOURS Performed at [facility], [address] [place], [place] [address]    Report Status PENDING   Culture, blood (routine x 2)     Status: None (Preliminary result)   Collection Time: 03/26/20 11:07 AM   Specimen: BLOOD LEFT ARM  Result Value Ref Range   Specimen Description BLOOD LEFT ARM    Special Requests      BOTTLES DRAWN AEROBIC ONLY Blood Culture results may not be optimal due to an inadequate volume of blood received in culture bottles   Culture      NO GROWTH < 24 HOURS Performed at [facility], [address] [place], [place] [address]    Report Status PENDING   Glucose, capillary  Status: Abnormal   Collection Time: 03/26/20 11:34 AM  Result Value Ref Range   Glucose-Capillary 154 (H) 70 - 99 mg/dL    Comment: Glucose reference range applies only to samples taken after fasting for at least 8 hours.  Glucose, capillary     Status: Abnormal   Collection Time: 03/26/20  5:14 PM  Result Value Ref Range   Glucose-Capillary 194 (H) 70 - 99 mg/dL    Comment: Glucose reference range applies only to samples taken after fasting for at least 8 hours.  Glucose, capillary     Status: Abnormal   Collection Time: 03/26/20  8:53 PM  Result Value Ref Range   Glucose-Capillary 200 (H) 70 - 99 mg/dL    Comment: Glucose reference range applies only to samples taken after fasting for at least 8 hours.   CBC with Differential/Platelet     Status: Abnormal   Collection Time: 03/27/20 12:37 AM  Result Value Ref Range   WBC 29.4 (H) 4.0 - 10.5 K/uL   RBC 3.54 (L) 4.22 - 5.81 MIL/uL   Hemoglobin 10.9 (L) 13.0 - 17.0 g/dL   HCT 16.1 (L) 09.6 - 04.5 %   MCV 87.3 80.0 - 100.0 fL   MCH 30.8 26.0 - 34.0 pg   MCHC 35.3 30.0 - 36.0 g/dL   RDW 40.9 81.1 - 91.4 %   Platelets 471 (H) 150 - 400 K/uL   nRBC 0.0 0.0 - 0.2 %   Neutrophils Relative % 90 %   Neutro Abs 26.4 (H) 1.7 - 7.7 K/uL   Lymphocytes Relative 3 %   Lymphs Abs 0.8 0.7 - 4.0 K/uL   Monocytes Relative 6 %   Monocytes Absolute 1.8 (H) 0.1 - 1.0 K/uL   Eosinophils Relative 0 %   Eosinophils Absolute 0.0 0.0 - 0.5 K/uL   Basophils Relative 0 %   Basophils Absolute 0.1 0.0 - 0.1 K/uL   Immature Granulocytes 1 %   Abs Immature Granulocytes 0.26 (H) 0.00 - 0.07 K/uL    Comment: Performed at [facility], [address], [place], [place] [address]  Comprehensive metabolic panel     Status: Abnormal   Collection Time: 03/27/20 12:37 AM  Result Value Ref Range   Sodium 137 135 - 145 mmol/L   Potassium 4.4 3.5 - 5.1 mmol/L   Chloride 101 98 - 111 mmol/L   CO2 26 22 - 32 mmol/L   Glucose, Bld 131 (H) 70 - 99 mg/dL    Comment: Glucose reference range applies only to samples taken after fasting for at least 8 hours.   BUN 22 8 - 23 mg/dL   Creatinine, Ser 6.21 0.61 - 1.24 mg/dL   Calcium 8.8 (L) 8.9 - 10.3 mg/dL   Total Protein 5.7 (L) 6.5 - 8.1 g/dL   Albumin 2.4 (L) 3.5 - 5.0 g/dL   AST 38 15 - 41 U/L   ALT 35 0 - 44 U/L   Alkaline Phosphatase 51 38 - 126 U/L   Total Bilirubin 0.4 0.3 - 1.2 mg/dL   GFR, Estimated >30 >86 mL/min    Comment: (NOTE) Calculated using the CKD-EPI Creatinine Equation (2021)    Anion gap 10 5 - 15    Comment: Performed at [facility], [address], [place], [place] [address]  C-reactive protein     Status: Abnormal   Collection Time: 03/27/20 12:37 AM  Result Value Ref Range   CRP  12.1 (H) <1.0 mg/dL  Comment: Performed at [facility], [address], [place], [place] [address]  D-dimer, quantitative (not at [facility])     Status: Abnormal   Collection Time: 03/27/20 12:37 AM  Result Value Ref Range   D-Dimer, Quant 3.39 (H) 0.00 - 0.50 ug/mL-FEU    Comment: (NOTE) At the manufacturer cut-off value of 0.5 g/mL FEU, this assay has a negative predictive value of 95-100%.This assay is intended for use in conjunction with a clinical pretest probability (PTP) assessment model to exclude pulmonary embolism (PE) and deep venous thrombosis (DVT) in outpatients suspected of PE or DVT. Results should be correlated with clinical presentation. Performed at [facility], [address], [place], [place] [address]   Ferritin     Status: Abnormal   Collection Time: 03/27/20 12:37 AM  Result Value Ref Range   Ferritin 412 (H) 24 - 336 ng/mL    Comment: Performed at [facility], [address], [place], [place] [address]  Magnesium     Status: None   Collection Time: 03/27/20 12:37 AM  Result Value Ref Range   Magnesium 2.1 1.7 - 2.4 mg/dL    Comment: Performed at [facility], [address] [place], [place] [address]  Phosphorus     Status: None   Collection Time: 03/27/20 12:37 AM  Result Value Ref Range   Phosphorus 3.6 2.5 - 4.6 mg/dL    Comment: Performed at [facility], [address], [place], [place] [address]  Glucose, capillary     Status: Abnormal   Collection Time: 03/27/20  7:44 AM  Result Value Ref Range   Glucose-Capillary 125 (H) 70 - 99 mg/dL    Comment: Glucose reference range applies only to samples taken after fasting for at least 8 hours.  Glucose, capillary     Status: Abnormal   Collection Time: 03/27/20 11:37 AM  Result Value Ref Range   Glucose-Capillary 119 (H) 70 - 99 mg/dL    Comment: Glucose reference range applies only to samples taken after fasting for at least 8 hours.  Glucose, capillary     Status: None    Collection Time: 03/27/20  5:02 PM  Result Value Ref Range   Glucose-Capillary 81 70 - 99 mg/dL    Comment: Glucose reference range applies only to samples taken after fasting for at least 8 hours.  Glucose, capillary     Status: Abnormal   Collection Time: 03/27/20  9:08 PM  Result Value Ref Range   Glucose-Capillary 127 (H) 70 - 99 mg/dL    Comment: Glucose reference range applies only to samples taken after fasting for at least 8 hours.  CBC with Differential/Platelet     Status: Abnormal   Collection Time: 03/28/20  1:12 AM  Result Value Ref Range   WBC 15.8 (H) 4.0 - 10.5 K/uL   RBC 3.81 (L) 4.22 - 5.81 MIL/uL   Hemoglobin 11.8 (L) 13.0 - 17.0 g/dL   HCT 47.4 (L) 25.9 - 56.3 %   MCV 87.7 80.0 - 100.0 fL   MCH 31.0 26.0 - 34.0 pg   MCHC 35.3 30.0 - 36.0 g/dL   RDW 87.5 64.3 - 32.9 %   Platelets 463 (H) 150 - 400 K/uL   nRBC 0.0 0.0 - 0.2 %   Neutrophils Relative % 80 %   Neutro Abs 12.6 (H) 1.7 - 7.7 K/uL   Lymphocytes Relative 9 %   Lymphs Abs 1.5 0.7 - 4.0 K/uL  Monocytes Relative 10 %   Monocytes Absolute 1.5 (H) 0.1 - 1.0 K/uL   Eosinophils Relative 0 %   Eosinophils Absolute 0.1 0.0 - 0.5 K/uL   Basophils Relative 0 %   Basophils Absolute 0.0 0.0 - 0.1 K/uL   Immature Granulocytes 1 %   Abs Immature Granulocytes 0.12 (H) 0.00 - 0.07 K/uL    Comment: Performed at [facility], [address], [place], [place] [address]  Comprehensive metabolic panel     Status: Abnormal   Collection Time: 03/28/20  1:12 AM  Result Value Ref Range   Sodium 134 (L) 135 - 145 mmol/L   Potassium 3.9 3.5 - 5.1 mmol/L   Chloride 99 98 - 111 mmol/L   CO2 28 22 - 32 mmol/L   Glucose, Bld 105 (H) 70 - 99 mg/dL    Comment: Glucose reference range applies only to samples taken after fasting for at least 8 hours.   BUN 20 8 - 23 mg/dL   Creatinine, Ser 6.04 (L) 0.61 - 1.24 mg/dL   Calcium 8.2 (L) 8.9 - 10.3 mg/dL   Total Protein 5.3 (L) 6.5 - 8.1 g/dL   Albumin 2.3 (L) 3.5 - 5.0  g/dL   AST 25 15 - 41 U/L   ALT 34 0 - 44 U/L   Alkaline Phosphatase 45 38 - 126 U/L   Total Bilirubin 0.8 0.3 - 1.2 mg/dL   GFR, Estimated >54 >09 mL/min    Comment: (NOTE) Calculated using the CKD-EPI Creatinine Equation (2021)    Anion gap 7 5 - 15    Comment: Performed at [facility], [address] [place], [place] [address]  C-reactive protein     Status: Abnormal   Collection Time: 03/28/20  1:12 AM  Result Value Ref Range   CRP 7.4 (H) <1.0 mg/dL    Comment: Performed at [facility], [address], [place], [place] [address]  D-dimer, quantitative (not at [facility])     Status: Abnormal   Collection Time: 03/28/20  1:12 AM  Result Value Ref Range   D-Dimer, Quant 2.66 (H) 0.00 - 0.50 ug/mL-FEU    Comment: (NOTE) At the manufacturer cut-off value of 0.5 g/mL FEU, this assay has a negative predictive value of 95-100%.This assay is intended for use in conjunction with a clinical pretest probability (PTP) assessment model to exclude pulmonary embolism (PE) and deep venous thrombosis (DVT) in outpatients suspected of PE or DVT. Results should be correlated with clinical presentation. Performed at [facility], [address], [place], [place] [address]   Ferritin     Status: None   Collection Time: 03/28/20  1:12 AM  Result Value Ref Range   Ferritin 331 24 - 336 ng/mL    Comment: Performed at [facility], [address], [place], [place] [address]  Magnesium     Status: None   Collection Time: 03/28/20  1:12 AM  Result Value Ref Range   Magnesium 1.9 1.7 - 2.4 mg/dL    Comment: Performed at [facility], [address] [place], [place] [address]  Phosphorus     Status: None   Collection Time: 03/28/20  1:12 AM  Result Value Ref Range   Phosphorus 3.5 2.5 - 4.6 mg/dL    Comment: Performed at [facility], [address] [place], [place] [address]  Glucose, capillary     Status: Abnormal   Collection Time: 03/28/20  7:57  AM   Result Value Ref Range   Glucose-Capillary 135 (H) 70 - 99 mg/dL    Comment: Glucose reference range applies only to samples taken after fasting for at least 8 hours.    CT CHEST WO CONTRAST  Result Date: 03/28/2020 CLINICAL DATA:  Pleural effusion. EXAM: CT CHEST WITHOUT CONTRAST TECHNIQUE: Multidetector CT imaging of the chest was performed following the standard protocol without IV contrast. COMPARISON:  March 27, 2020.  March 25, 2020. FINDINGS: Cardiovascular: Atherosclerosis of thoracic aorta is noted without aneurysm formation. Normal cardiac size. No pericardial effusion. Mild coronary artery calcifications are noted. Mediastinum/Nodes: No adenopathy is noted. Thyroid gland is unremarkable. Dilated and fluid-filled esophagus is noted. There appears to be aspirated material within the trachea. Lungs/Pleura: No pneumothorax is noted. Interval placement of pigtail drainage catheter into right pleural effusion noted on prior exam. The effusion is significantly smaller compared to prior exam, a small residual effusion and adjacent atelectasis or infiltrate seen posteriorly in the right lower lobe. Mild left posterior basilar subsegmental atelectasis is noted. Upper Abdomen: Stable partially calcified right adrenal mass is noted. Musculoskeletal: No chest wall mass or suspicious bone lesions identified. IMPRESSION: 1. Interval placement of pigtail drainage catheter into right pleural effusion noted on prior exam. The effusion is significantly smaller compared to prior exam, a small residual effusion and adjacent atelectasis or infiltrate seen posteriorly in the right lower lobe. 2. There appears to be aspirated material within the trachea. 3. Dilated and fluid-filled esophagus is noted. Distal obstruction cannot be excluded. 4. Stable partially calcified right adrenal mass is noted. 5. Mild coronary artery calcifications are noted. 6. Aortic atherosclerosis. Aortic Atherosclerosis (ICD10-I70.0).  Electronically Signed   By: Lupita Raider M.D.   On: 03/28/2020 07:58   DG CHEST PORT 1 VIEW  Result Date: 03/27/2020 CLINICAL DATA:  Pleural effusion.  Chest tube. EXAM: PORTABLE CHEST 1 VIEW COMPARISON:  03/26/2020. FINDINGS: Right chest tube in stable position. No pneumothorax noted on today's exam. Stable right base and right upper medial loculated pleural effusions. Persistent bibasilar atelectasis. Heart size stable. IMPRESSION: Right chest tube in stable position. No pneumothorax noted on today's exam. Stable right base and right upper medial loculated pleural effusions. Electronically Signed   By: Maisie Fus  Register   On: 03/27/2020 07:24   ECHOCARDIOGRAM LIMITED  Result Date: 03/26/2020    ECHOCARDIOGRAM LIMITED REPORT   Patient Name:   Mike Moran Date of Exam: 03/26/2020 Medical Rec #:  [id number]      Height:       72.0 in Accession #:    [id number]     Weight:       130.3 lb Date of Birth:  Oct 13, 1955      BSA:          1.776 m Patient Age:    64 years       BP:           92/64 mmHg Patient Gender: M              HR:           73 bpm. Exam Location:  Inpatient Procedure: Limited Echo, Cardiac Doppler and Color Doppler Indications:    Bradycardia  History:        Patient has no prior history of Echocardiogram examinations.                 Arrythmias:Bradycardia; Signs/Symptoms:Shortness of Breath.  Alcohol abuse, Loculated Right pleural effusion, chest tube                 present, COVID+.  Sonographer:    Mike Moran Referring Phys: [id number] Mike Moran IMPRESSIONS  1. Left ventricular ejection fraction, by estimation, is 55 to 60%. The left ventricle has normal function. The left ventricle has no regional wall motion abnormalities. There is mild left ventricular hypertrophy. Left ventricular diastolic parameters were normal.  2. Right ventricular systolic function is normal. The right ventricular size is mildly enlarged. Tricuspid regurgitation signal is inadequate  for assessing PA pressure.  3. A small pericardial effusion is present.  4. The mitral valve is normal in structure. Trivial mitral valve regurgitation.  5. The aortic valve is tricuspid. Aortic valve regurgitation is not visualized. No aortic stenosis is present.  6. The inferior vena cava is normal in size with <50% respiratory variability, suggesting right atrial pressure of 8 mmHg. FINDINGS  Left Ventricle: Left ventricular ejection fraction, by estimation, is 55 to 60%. The left ventricle has normal function. The left ventricle has no regional wall motion abnormalities. The left ventricular internal cavity size was small. There is mild left ventricular hypertrophy. Left ventricular diastolic parameters were normal. Right Ventricle: The right ventricular size is mildly enlarged. Right ventricular systolic function is normal. Tricuspid regurgitation signal is inadequate for assessing PA pressure. Pericardium: A small pericardial effusion is present. Mitral Valve: The mitral valve is normal in structure. Trivial mitral valve regurgitation. Tricuspid Valve: The tricuspid valve is normal in structure. Tricuspid valve regurgitation is trivial. Aortic Valve: The aortic valve is tricuspid. Aortic valve regurgitation is not visualized. No aortic stenosis is present. Aorta: The aortic root is normal in size and structure. Venous: The inferior vena cava is normal in size with less than 50% respiratory variability, suggesting right atrial pressure of 8 mmHg. LEFT VENTRICLE PLAX 2D LVIDd:         3.60 cm  Diastology LVIDs:         2.70 cm  LV e' medial:    7.94 cm/s LV PW:         0.90 cm  LV E/e' medial:  8.8 LV IVS:        1.00 cm  LV e' lateral:   8.27 cm/s LVOT diam:     1.90 cm  LV E/e' lateral: 8.4 LVOT Area:     2.84 cm  RIGHT VENTRICLE RV S prime:     4.03 cm/s LEFT ATRIUM         Index LA diam:    2.60 cm 1.46 cm/m   AORTA Ao Root diam: 3.10 cm MITRAL VALVE MV Area (PHT): 4.89 cm    SHUNTS MV Decel Time: 155  msec    Systemic Diam: 1.90 cm MV E velocity: 69.80 cm/s MV A velocity: 56.10 cm/s MV E/A ratio:  1.24 Epifanio Lesches MD Electronically signed by Epifanio Lesches MD Signature Date/Time: 03/26/2020/11:38:24 PM    Final     Review of Systems Blood pressure 106/67, pulse 72, temperature 98.1 F (36.7 C), temperature source Oral, resp. rate 20, height 6' (1.829 m), weight 58.8 kg, SpO2 98 %. Physical Exam  Assessment/Plan: Right empyema with staph epi bacterium S/p drainage and tpa   Rowe Clack PA-C  03/28/2020, 9:15 AM  Have not examined patient with current covid restrictions  Patient history and films reviewed  Staph epi bacteremia Large effusion has responded to drainage and tpa  - some residual  effusion but small - would not recommend VATS at this point Will follow with you   I have seen and examined Mike Moran and agree with the above assessment  and plan.  Delight Ovens MD Beeper [phone] Office [phone] 03/28/2020 10:38 AM   Also of concern is dilated esophagus- when possible would do Gastrogaffin swallow to evaluate, depending on findings endoscopy when over covid restrictions

## 2020-03-28 NOTE — Progress Notes (Signed)
PROGRESS NOTE                                                                                                                                                                                                             Patient Demographics:    Mike Moran, is a 65 y.o. male, DOB - 1955-04-13, ZOX:[id number]  Outpatient Primary MD for the patient is Mike Found, MD   Admit date - 03/25/2020   LOS - 3  Chief Complaint  Patient presents with  . Shortness of Breath       Brief Narrative: Patient is a 65 y.o. male with PMHx of anxiety/depression-presented to [facility] on 2/15 with shortness of breath-Moran to have large right-sided loculated pleural effusion and COVID-19 infection.   COVID-19 vaccinated status: Unvaccinated  Significant Events: 2/15>> Admit to [facility] for shortness of breath-Moran to have a large right-sided loculated pleural effusion and COVID-19 infection  2/16 >> transferred to [facility]   Significant studies: 2/15>> chest x-ray: Large right-sided pleural effusion 2/15>> CTA chest: Large/loculated right pleural effusion with extensive pulmonary collapse.  5.6 right adrenal mass 2/16>> Echo: EF 55-60%-no obvious vegetation seen on valves.   COVID-19 medications: Steroids: 2/15>> 2/16 Remdesivir: 2/14>>2/17  Antibiotics: Vancomycin: 2/14>> 2/16 Cefepime: 2/14>>2/15 Ancef: 2/16>>  Microbiology data: 2/15 >>blood culture: Staph hominis, staph epidermidis 2/15>> pleural fluid culture: No growth  2/16>> blood culture: No growth  Procedures: 2/15>> chest tube insertion  Consults: CCM,cards  DVT prophylaxis: enoxaparin (LOVENOX) injection 30 mg Start: 03/27/20 1230 SCDs Start: 03/25/20 0502    Subjective:   Main complaint is constipation today.  States he does not feel good-continues to cough.   Assessment  & Plan :   Acute Hypoxic Resp Failure due to loculated large right-sided pleural  effusion-likely empyema: Stable-chest tube in place-on IV Ancef-PCCM following-getting fibrillatory therapy via chest tube.  Pleural fluid cultures negative so far.  Evaluated by cardiothoracic surgery-not a surgical candidate at this point.  Unclear if coag negative staph is the culprit organism-unusual organism to cause pulmonary infection.  Will get ID opinion-in the interim-continue with IV Ancef.  Coag negative staph bacteremia: On IV Ancef-repeat cultures negative so far.  Echo without obvious vegetations.  Await ID input.   Paroxysmal AV block: Continue telemetry monitoring-avoid AV nodal blocking agents-cardiology suspect that this is vagally mediated-if he becomes more symptomatic-cardiology  thinking of placing a temporary pacer.    COVID-19 infection: Suspect mostly incidental finding-completed 3 days of Remdesivir.    Fever: afebrile O2 requirements:  SpO2: 98 % O2 Flow Rate (L/min): 2 L/min   COVID-19 Labs: Recent Labs    03/26/20 0149 03/27/20 0037 03/28/20 0112  DDIMER 3.82* 3.39* 2.66*  FERRITIN 283 412* 331  CRP 18.7* 12.1* 7.4*       Component Value Date/Time   BNP 189.0 (H) 03/25/2020 0205    Recent Labs  Lab 03/25/20 0205  PROCALCITON 0.32    Lab Results  Component Value Date   SARSCOV2NAA POSITIVE (A) 03/25/2020     Prone/Incentive Spirometry: encouraged incentive spirometry use 3-4/hour.  Elevated D-dimer: On prophylactic Lovenox-check lower extremity Doppler.  Right adrenal mass: Chronic-benign based on 2010 abdominal ultrasound  Alcohol abuse: Claims he quit 10 years back-watch closely-no signs of withdrawal.  3 mm left apical nodule: Stable for outpatient follow-up  Depression/anxiety: Stable today-continue trazodone/Cymbalta and Atarax.    Constipation: Start MiraLAX/senna-and see how he does.  ABG: No results Moran for: PHART, PCO2ART, PO2ART, HCO3, TCO2, ACIDBASEDEF, O2SAT  Vent Settings: N/A  Condition -Guarded  Family  Communication  : Daughter-Mike [phone]-left voicemail on 2/18.  Subsequently called spouse Mike [phone]-left voicemail on 2/18.  Code Status :  Full Code  Diet :  Diet Order            Diet regular Room service appropriate? Yes; Fluid consistency: Thin  Diet effective now                  Disposition Plan  :   Status is: Inpatient  Remains inpatient appropriate because:Inpatient level of care appropriate due to severity of illness   Dispo: The patient is from: Home              Anticipated d/c is to: Home              Anticipated d/c date is: > 3 days              Patient currently is not medically stable to d/c.   Difficult to place patient No   Barriers to discharge: Empyema with bacteremia-chest tube in place-on IV antibiotics  Antimicorbials  :    Anti-infectives (From admission, onward)   Start     Dose/Rate Route Frequency Ordered Stop   03/26/20 1400  ceFAZolin (ANCEF) IVPB 2g/100 mL premix        2 g 200 mL/hr over 30 Minutes Intravenous Every 8 hours 03/26/20 1114     03/26/20 1000  remdesivir 100 mg in sodium chloride 0.9 % 100 mL IVPB        100 mg 200 mL/hr over 30 Minutes Intravenous Daily 03/25/20 0347 03/27/20 1027   03/25/20 2200  vancomycin (VANCOCIN) IVPB 1000 mg/200 mL premix  Status:  Discontinued        1,000 mg 200 mL/hr over 60 Minutes Intravenous Every 12 hours 03/25/20 0510 03/26/20 1114   03/25/20 1400  ceFEPIme (MAXIPIME) 2 g in sodium chloride 0.9 % 100 mL IVPB  Status:  Discontinued        2 g 200 mL/hr over 30 Minutes Intravenous Every 8 hours 03/25/20 0530 03/26/20 1114   03/25/20 0500  ceFEPIme (MAXIPIME) 2 g in sodium chloride 0.9 % 100 mL IVPB        2 g 200 mL/hr over 30 Minutes Intravenous  Once 03/25/20 0446 03/25/20 0638   03/25/20 0500  vancomycin (VANCOCIN)  IVPB 1000 mg/200 mL premix        1,000 mg 200 mL/hr over 60 Minutes Intravenous  Once 03/25/20 0452 03/25/20 0723   03/25/20 0400  remdesivir 100 mg  in sodium chloride 0.9 % 100 mL IVPB        100 mg 200 mL/hr over 30 Minutes Intravenous Every 30 min 03/25/20 0347 03/25/20 0527      Inpatient Medications  Scheduled Meds: . albuterol  2 puff Inhalation Q6H  . vitamin C  500 mg Oral Daily  . DULoxetine  60 mg Oral Daily  . enoxaparin (LOVENOX) injection  30 mg Subcutaneous BID  . insulin aspart  0-15 Units Subcutaneous TID WC  . insulin aspart  0-5 Units Subcutaneous QHS  . pantoprazole  40 mg Oral QHS  . phosphorus  250 mg Oral BID  . polyethylene glycol  17 g Oral Q6H  . [START ON 03/29/2020] polyethylene glycol  17 g Oral Daily  . psyllium  1 packet Oral Daily  . risperiDONE  0.5 mg Oral QHS  . [START ON 03/29/2020] senna  2 tablet Oral QHS  . senna-docusate  2 tablet Oral Once  . sodium chloride flush  10 mL Intracatheter Q8H  . thiamine  100 mg Oral Daily  . traZODone  100 mg Oral QHS  . zinc sulfate  220 mg Oral Daily   Continuous Infusions: .  ceFAZolin (ANCEF) IV 2 g (03/28/20 0508)   PRN Meds:.acetaminophen, bisacodyl, guaiFENesin-dextromethorphan, hydrOXYzine, morphine injection, oxyCODONE   Time Spent in minutes  25  See all Orders from today for further details   Jeoffrey Massed M.D on 03/28/2020 at 1:53 PM  To page go to [email or web address] - use universal password  [facility] -  Office  [phone]    Objective:   Vitals:   03/27/20 1456 03/27/20 1920 03/27/20 2015 03/28/20 0455  BP: (!) 96/57  114/72 106/67  Pulse: 85  95 72  Resp: Temp: 98.5 F (36.9 C)  98.4 F (36.9 C) 98.1 F (36.7 C)  TempSrc: Axillary  Oral Oral  SpO2: 99% 100% 100% 98%  Weight:      Height:        Wt Readings from Last 3 Encounters:  03/27/20 58.8 kg  05/20/19 68 kg  11/23/17 75.8 kg     Intake/Output Summary (Last 24 hours) at 03/28/2020 1353 Last data filed at 03/28/2020 8295 Gross per 24 hour  Intake --  Output 1670 ml  Net -1670 ml     Physical Exam Gen Exam:Alert awake-not in any  distress.  Looks frail and deconditioned. HEENT:atraumatic, normocephalic Chest: B/L clear to auscultation anteriorly CVS:S1S2 regular Abdomen:soft non tender, non distended Extremities:no edema Neurology: Non focal Skin: no rash   Data Review:    CBC Recent Labs  Lab 03/25/20 0205 03/26/20 0149 03/27/20 0037 03/28/20 0112  WBC 29.5* 38.4* 29.4* 15.8*  HGB 11.6* 11.7* 10.9* 11.8*  HCT 34.5* 35.6* 30.9* 33.4*  PLT 467* 468* 471* 463*  MCV 91.0 90.1 87.3 87.7  MCH 30.6 29.6 30.8 31.0  MCHC 33.6 32.9 35.3 35.3  RDW 14.6 14.5 14.6 14.3  LYMPHSABS 0.5* 0.4* 0.8 1.5  MONOABS 1.3* 1.4* 1.8* 1.5*  EOSABS 0.0 0.0 0.0 0.1  BASOSABS 0.1 0.1 0.1 0.0    Chemistries  Recent Labs  Lab 03/25/20 0205 03/26/20 0149 03/27/20 0037 03/28/20 0112  NA 135 135 137 134*  K 3.6 4.2 4.4 3.9  CL 101 102 101 99  CO2 GLUCOSE 133* 171* 131* 105*  BUN CREATININE 0.65 0.50* 0.62 0.57*  CALCIUM 8.9 8.9 8.8* 8.2*  MG  --  1.8 2.1 1.9  AST 16 14* 38 25  ALT 14 14 35 34  ALKPHOS 50 58 51 45  BILITOT 0.5 0.6 0.4 0.8   ------------------------------------------------------------------------------------------------------------------ No results for input(s): CHOL, HDL, LDLCALC, TRIG, CHOLHDL, LDLDIRECT in the last 72 hours.  Lab Results  Component Value Date   HGBA1C 5.6 01/10/2014   ------------------------------------------------------------------------------------------------------------------ Recent Labs    03/26/20 0149  TSH 0.744   ------------------------------------------------------------------------------------------------------------------ Recent Labs    03/27/20 0037 03/28/20 0112  FERRITIN 412* 331    Coagulation profile No results for input(s): INR, PROTIME in the last 168 hours.  Recent Labs    03/27/20 0037 03/28/20 0112  DDIMER 3.39* 2.66*    Cardiac Enzymes No results for input(s): CKMB, TROPONINI, MYOGLOBIN in the last 168  hours.  Invalid input(s): CK ------------------------------------------------------------------------------------------------------------------    Component Value Date/Time   BNP 189.0 (H) 03/25/2020 0205    Micro Results Recent Results (from the past 240 hour(s))  Culture, blood (Routine X 2) w Reflex to ID Panel     Status: Abnormal (Preliminary result)   Collection Time: 03/25/20  2:05 AM   Specimen: Right Antecubital; Blood  Result Value Ref Range Status   Specimen Description   Final    RIGHT ANTECUBITAL Performed at [facility], [address], [place], [place] [address]    Special Requests   Final    BAA Blood Culture adequate volume Performed at [facility], [address] [place], [place] [address]    Culture  Setup Time   Final    GRAM POSITIVE COCCI IN BOTH AEROBIC AND ANAEROBIC BOTTLES Gram Stain Report Called to,Read Back By and Verified With: KNECHT,LINDSAY @ [facility]  BY MATTHEWS, B 2.15.22 Performed at [facility], [address] [place], [place] [address]    Culture (A)  Final    STAPHYLOCOCCUS HOMINIS STAPHYLOCOCCUS EPIDERMIDIS    Report Status PENDING  Incomplete  Resp Panel by RT-PCR (Flu A&B, Covid) Nasopharyngeal Swab     Status: Abnormal   Collection Time: 03/25/20  2:10 AM   Specimen: Nasopharyngeal Swab; Nasopharyngeal(NP) swabs in vial transport medium  Result Value Ref Range Status   SARS Coronavirus 2 by RT PCR POSITIVE (A) NEGATIVE Final    Comment: RESULT CALLED TO, READ BACK BY AND VERIFIED WITH: WALKER,T@0326  BY MATTHEWS,B 2.15.22 (NOTE) SARS-CoV-2 target nucleic acids are DETECTED.  The SARS-CoV-2 RNA is generally detectable in upper respiratory specimens during the acute phase of infection. Positive results are indicative of the presence of the identified virus, but do not rule out bacterial infection or co-infection with other pathogens not detected by the test. Clinical correlation with patient history and other  diagnostic information is necessary to determine patient infection status. The expected result is Negative.  Fact Sheet for Patients: BloggerCourse.com  Fact Sheet for Healthcare Providers: SeriousBroker.it  This test is not yet approved or cleared by the [place] FDA and  has been authorized for detection and/or diagnosis of SARS-CoV-2 by FDA under an Emergency Use Authorization (EUA).  This EUA will remain in effect (meaning this test can  be used) for the duration of  the COVID-19 declaration under Section 564(b)(1) of the Act, 21 U.S.C. section 360bbb-3(b)(1), unless the authorization is terminated or revoked sooner.     Influenza A  by PCR NEGATIVE NEGATIVE Final   Influenza B by PCR NEGATIVE NEGATIVE Final    Comment: (NOTE) The Xpert Xpress SARS-CoV-2/FLU/RSV plus assay is intended as an aid in the diagnosis of influenza from Nasopharyngeal swab specimens and should not be used as a sole basis for treatment. Nasal washings and aspirates are unacceptable for Xpert Xpress SARS-CoV-2/FLU/RSV testing.  Fact Sheet for Patients: BloggerCourse.comhttps://www.fda.gov/media/152166/download  Fact Sheet for Healthcare Providers: SeriousBroker.ithttps://www.fda.gov/media/152162/download  This test is not yet approved or cleared by the [place] FDA and has been authorized for detection and/or diagnosis of SARS-CoV-2 by FDA under an Emergency Use Authorization (EUA). This EUA will remain in effect (meaning this test can be used) for the duration of the COVID-19 declaration under Section 564(b)(1) of the Act, 21 U.S.C. section 360bbb-3(b)(1), unless the authorization is terminated or revoked.  Performed at [facility], [address] [place], [place] [address]   Culture, blood (Routine X 2) w Reflex to ID Panel     Status: Abnormal (Preliminary result)   Collection Time: 03/25/20  2:45 AM   Specimen: Right Antecubital; Blood  Result Value Ref Range  Status   Specimen Description   Final    RIGHT ANTECUBITAL Performed at [facility], [address] [place], [place] [address]    Special Requests   Final    BAA Blood Culture adequate volume Performed at [facility], [address] [place], [place] [address]    Culture  Setup Time   Final    GRAM POSITIVE COCCI IN BOTH AEROBIC AND ANAEROBIC BOTTLES Gram Stain Report Called to,Read Back By and Verified With: [email or web address]@[address]  by matthews,b 2.15.22 Organism ID to follow CRITICAL RESULT CALLED TO, READ BACK BY AND VERIFIED WITH: J LEDFORD PHARMD 03/26/20 0451 JDW    Culture (A)  Final    STAPHYLOCOCCUS HOMINIS STAPHYLOCOCCUS EPIDERMIDIS SUSCEPTIBILITIES TO FOLLOW Performed at [facility], [address] [place], [place] [address]    Report Status PENDING  Incomplete  Blood Culture ID Panel (Reflexed)     Status: Abnormal   Collection Time: 03/25/20  2:45 AM  Result Value Ref Range Status   Enterococcus faecalis NOT DETECTED NOT DETECTED Final   Enterococcus Faecium NOT DETECTED NOT DETECTED Final   Listeria monocytogenes NOT DETECTED NOT DETECTED Final   Staphylococcus species DETECTED (A) NOT DETECTED Final    Comment: CRITICAL RESULT CALLED TO, READ BACK BY AND VERIFIED WITH: J LEDFORD PHARMD 03/26/20 0451 JDW    Staphylococcus aureus (BCID) NOT DETECTED NOT DETECTED Final   Staphylococcus epidermidis DETECTED (A) NOT DETECTED Final    Comment: CRITICAL RESULT CALLED TO, READ BACK BY AND VERIFIED WITH: J LEDFORD PHARMD 03/26/20 0451 JDW    Staphylococcus lugdunensis NOT DETECTED NOT DETECTED Final   Streptococcus species NOT DETECTED NOT DETECTED Final   Streptococcus agalactiae NOT DETECTED NOT DETECTED Final   Streptococcus pneumoniae NOT DETECTED NOT DETECTED Final   Streptococcus pyogenes NOT DETECTED NOT DETECTED Final   A.calcoaceticus-baumannii NOT DETECTED NOT DETECTED Final   Bacteroides fragilis NOT DETECTED NOT DETECTED Final    Enterobacterales NOT DETECTED NOT DETECTED Final   Enterobacter cloacae complex NOT DETECTED NOT DETECTED Final   Escherichia coli NOT DETECTED NOT DETECTED Final   Klebsiella aerogenes NOT DETECTED NOT DETECTED Final   Klebsiella oxytoca NOT DETECTED NOT DETECTED Final   Klebsiella pneumoniae NOT DETECTED NOT DETECTED Final   Proteus species NOT DETECTED NOT DETECTED Final   Salmonella species NOT DETECTED NOT DETECTED Final  Serratia marcescens NOT DETECTED NOT DETECTED Final   Haemophilus influenzae NOT DETECTED NOT DETECTED Final   Neisseria meningitidis NOT DETECTED NOT DETECTED Final   Pseudomonas aeruginosa NOT DETECTED NOT DETECTED Final   Stenotrophomonas maltophilia NOT DETECTED NOT DETECTED Final   Candida albicans NOT DETECTED NOT DETECTED Final   Candida auris NOT DETECTED NOT DETECTED Final   Candida glabrata NOT DETECTED NOT DETECTED Final   Candida krusei NOT DETECTED NOT DETECTED Final   Candida parapsilosis NOT DETECTED NOT DETECTED Final   Candida tropicalis NOT DETECTED NOT DETECTED Final   Cryptococcus neoformans/gattii NOT DETECTED NOT DETECTED Final   Methicillin resistance mecA/C NOT DETECTED NOT DETECTED Final    Comment: Performed at [facility], [address] [place], [place] [address]  MRSA PCR Screening     Status: None   Collection Time: 03/25/20  9:59 AM   Specimen: Nasal Mucosa; Nasopharyngeal  Result Value Ref Range Status   MRSA by PCR NEGATIVE NEGATIVE Final    Comment:        The GeneXpert MRSA Assay (FDA approved for NASAL specimens only), is one component of a comprehensive MRSA colonization surveillance program. It is not intended to diagnose MRSA infection nor to guide or monitor treatment for MRSA infections. Performed at [facility], [address] [place], [place] [address]   Body fluid culture w Gram Stain     Status: None   Collection Time: 03/25/20  4:45 PM   Specimen: Pleura; Body Fluid  Result Value Ref Range  Status   Specimen Description PLEURAL FLUID RIGHT  Final   Special Requests Normal  Final   Gram Stain   Final    RARE WBC PRESENT, PREDOMINANTLY MONONUCLEAR NO ORGANISMS SEEN    Culture   Final    NO GROWTH Performed at [facility], [address] [place], [place] [address]    Report Status 03/28/2020 FINAL  Final  Culture, blood (routine x 2)     Status: None (Preliminary result)   Collection Time: 03/26/20 11:00 AM   Specimen: BLOOD  Result Value Ref Range Status   Specimen Description BLOOD SITE NOT SPECIFIED  Final   Special Requests   Final    BOTTLES DRAWN AEROBIC ONLY Blood Culture adequate volume   Culture   Final    NO GROWTH < 24 HOURS Performed at [facility], [address], [place], [place] [address]    Report Status PENDING  Incomplete  Culture, blood (routine x 2)     Status: None (Preliminary result)   Collection Time: 03/26/20 11:07 AM   Specimen: BLOOD LEFT ARM  Result Value Ref Range Status   Specimen Description BLOOD LEFT ARM  Final   Special Requests   Final    BOTTLES DRAWN AEROBIC ONLY Blood Culture results may not be optimal due to an inadequate volume of blood received in culture bottles   Culture   Final    NO GROWTH < 24 HOURS Performed at [facility], [address], [place], [place] [address]    Report Status PENDING  Incomplete    Radiology Reports CT CHEST WO CONTRAST  Result Date: 03/28/2020 CLINICAL DATA:  Pleural effusion. EXAM: CT CHEST WITHOUT CONTRAST TECHNIQUE: Multidetector CT imaging of the chest was performed following the standard protocol without IV contrast. COMPARISON:  March 27, 2020.  March 25, 2020. FINDINGS: Cardiovascular: Atherosclerosis of thoracic aorta is noted without aneurysm formation. Normal cardiac size.  No pericardial effusion. Mild coronary artery calcifications are noted. Mediastinum/Nodes: No adenopathy is noted. Thyroid gland is unremarkable. Dilated and fluid-filled esophagus is  noted. There appears to be aspirated material within the trachea. Lungs/Pleura: No pneumothorax is noted. Interval placement of pigtail drainage catheter into right pleural effusion noted on prior exam. The effusion is significantly smaller compared to prior exam, a small residual effusion and adjacent atelectasis or infiltrate seen posteriorly in the right lower lobe. Mild left posterior basilar subsegmental atelectasis is noted. Upper Abdomen: Stable partially calcified right adrenal mass is noted. Musculoskeletal: No chest wall mass or suspicious bone lesions identified. IMPRESSION: 1. Interval placement of pigtail drainage catheter into right pleural effusion noted on prior exam. The effusion is significantly smaller compared to prior exam, a small residual effusion and adjacent atelectasis or infiltrate seen posteriorly in the right lower lobe. 2. There appears to be aspirated material within the trachea. 3. Dilated and fluid-filled esophagus is noted. Distal obstruction cannot be excluded. 4. Stable partially calcified right adrenal mass is noted. 5. Mild coronary artery calcifications are noted. 6. Aortic atherosclerosis. Aortic Atherosclerosis (ICD10-I70.0). Electronically Signed   By: Lupita Raider M.D.   On: 03/28/2020 07:58   CT ANGIO CHEST PE W OR WO CONTRAST  Result Date: 03/25/2020 CLINICAL DATA:  Shortness of breath. EXAM: CT ANGIOGRAPHY CHEST WITH CONTRAST TECHNIQUE: Multidetector CT imaging of the chest was performed using the standard protocol during bolus administration of intravenous contrast. Multiplanar CT image reconstructions and MIPs were obtained to evaluate the vascular anatomy. CONTRAST:  OMNIPAQUE IOHEXOL 350 MG/ML SOLN COMPARISON:  None. FINDINGS: Cardiovascular: Satisfactory opacification of the pulmonary arteries to the segmental level. No evidence of pulmonary embolism. Normal heart size. No pericardial effusion. Atheromatous calcification of the aorta Mediastinum/Nodes:  No visible adenopathy or mass. Lungs/Pleura: Large right pleural effusion with loculation causing scalloping of the lung. The apicomedial opacity by radiography is also loculated pleural fluid. No visible mass. Extensive atelectasis in the right lung with collapsed lower and middle lobes. Secretions/debris seen in the central right-sided airways. Mild left-sided atelectasis. Tiny left apical pulmonary nodule measuring 3 mm, which can be re-evaluated at follow-up. Upper Abdomen: Right adrenal mass which is partially covered at 5.6 cm with low-density appearance and coarse calcifications. A right suprarenal mass of similar size was described on an abdominal ultrasound April 21, 2008. Musculoskeletal: No bony erosion adjacent to the pleural fluid. Review of the MIP images confirms the above findings. IMPRESSION: 1. Large and loculated right pleural effusion with extensive right pulmonary collapse. No visible underlying tumor; empyema is the leading consideration. 2. Partially covered 5.6 cm right adrenal mass, chronic and benign based on a 2010 abdominal ultrasound. Electronically Signed   By: Marnee Spring M.D.   On: 03/25/2020 04:20   DG CHEST PORT 1 VIEW  Result Date: 03/27/2020 CLINICAL DATA:  Pleural effusion.  Chest tube. EXAM: PORTABLE CHEST 1 VIEW COMPARISON:  03/26/2020. FINDINGS: Right chest tube in stable position. No pneumothorax noted on today's exam. Stable right base and right upper medial loculated pleural effusions. Persistent bibasilar atelectasis. Heart size stable. IMPRESSION: Right chest tube in stable position. No pneumothorax noted on today's exam. Stable right base and right upper medial loculated pleural effusions. Electronically Signed   By: Maisie Fus  Register   On: 03/27/2020 07:24   DG CHEST PORT 1 VIEW  Result Date: 03/26/2020 CLINICAL DATA:  Pleural effusion.  Chest tube. EXAM: PORTABLE CHEST 1 VIEW COMPARISON:  Chest x-ray 03/25/2020.  CT chest  03/25/2020. FINDINGS: Right chest  tube in stable position. Tiny right apical pneumothorax again noted without interim change. Continued improvement in right base pleural effusion. Persistent right upper medial loculated pleural effusion again noted. Right base atelectasis/infiltrate. Heart size stable. IMPRESSION: 1. Right chest tube in stable position. Tiny right apical pneumothorax again noted without interim change. Continued improvement in right base pleural effusion. Persistent right upper medial loculated pleural effusion again noted. 2.  Right base atelectasis/infiltrate. Electronically Signed   By: Maisie Fus  Register   On: 03/26/2020 07:05   DG CHEST PORT 1 VIEW  Result Date: 03/25/2020 CLINICAL DATA:  Chest tube. EXAM: PORTABLE CHEST 1 VIEW COMPARISON:  CT 03/25/2020.  Chest x-ray 03/25/2020. FINDINGS: Interim placement right chest tube. Interim significant improvement right-sided pleural effusion with mild to moderate residual. Loculated pleural fluid component appears to be present over the upper medial chest. Right base atelectasis. Heart size stable. IMPRESSION: Interim placement of right chest tube. Interim significant improvement of right-sided pleural effusion with mild to moderate residual. Loculated pleural fluid component appears to be present over the upper medial chest. Associated very tiny right apical pneumothorax cannot be excluded on today's exam. Critical Value/emergent results were called by telephone at the time of interpretation on 03/25/2020 at 1:27 pm to nurse Lenda Kelp, who verbally acknowledged these results. Electronically Signed   By: Maisie Fus  Register   On: 03/25/2020 13:28   DG Chest Port 1 View  Result Date: 03/25/2020 CLINICAL DATA:  Cough and shortness of breath EXAM: PORTABLE CHEST 1 VIEW COMPARISON:  01/09/2014 FINDINGS: Large right pleural effusion. Basilar atelectasis. Left lung is clear. Medial opacity at the right lung apex. IMPRESSION: 1. Large right pleural effusion and basilar atelectasis. 2. Medial  opacity at the right lung apex. CT recommended for better characterization. Electronically Signed   By: Deatra Robinson M.D.   On: 03/25/2020 02:13   ECHOCARDIOGRAM LIMITED  Result Date: 03/26/2020    ECHOCARDIOGRAM LIMITED REPORT   Patient Name:   JAKIE DEBOW Date of Exam: 03/26/2020 Medical Rec #:  [id number]      Height:       72.0 in Accession #:    [id number]     Weight:       130.3 lb Date of Birth:  [birth year]      BSA:          1.776 m Patient Age:    64 years       BP:           92/64 mmHg Patient Gender: M              HR:           73 bpm. Exam Location:  Inpatient Procedure: Limited Echo, Cardiac Doppler and Color Doppler Indications:    Bradycardia  History:        Patient has no prior history of Echocardiogram examinations.                 Arrythmias:Bradycardia; Signs/Symptoms:Shortness of Breath.                 Alcohol abuse, Loculated Right pleural effusion, chest tube                 present, COVID+.  Sonographer:    Lavenia Atlas Referring Phys: [id number] Ronnald Ramp O'NEAL IMPRESSIONS  1. Left ventricular ejection fraction, by estimation, is 55 to 60%. The left ventricle has normal function. The left ventricle has no regional wall motion abnormalities. There  is mild left ventricular hypertrophy. Left ventricular diastolic parameters were normal.  2. Right ventricular systolic function is normal. The right ventricular size is mildly enlarged. Tricuspid regurgitation signal is inadequate for assessing PA pressure.  3. A small pericardial effusion is present.  4. The mitral valve is normal in structure. Trivial mitral valve regurgitation.  5. The aortic valve is tricuspid. Aortic valve regurgitation is not visualized. No aortic stenosis is present.  6. The inferior vena cava is normal in size with <50% respiratory variability, suggesting right atrial pressure of 8 mmHg. FINDINGS  Left Ventricle: Left ventricular ejection fraction, by estimation, is 55 to 60%. The left ventricle has normal  function. The left ventricle has no regional wall motion abnormalities. The left ventricular internal cavity size was small. There is mild left ventricular hypertrophy. Left ventricular diastolic parameters were normal. Right Ventricle: The right ventricular size is mildly enlarged. Right ventricular systolic function is normal. Tricuspid regurgitation signal is inadequate for assessing PA pressure. Pericardium: A small pericardial effusion is present. Mitral Valve: The mitral valve is normal in structure. Trivial mitral valve regurgitation. Tricuspid Valve: The tricuspid valve is normal in structure. Tricuspid valve regurgitation is trivial. Aortic Valve: The aortic valve is tricuspid. Aortic valve regurgitation is not visualized. No aortic stenosis is present. Aorta: The aortic root is normal in size and structure. Venous: The inferior vena cava is normal in size with less than 50% respiratory variability, suggesting right atrial pressure of 8 mmHg. LEFT VENTRICLE PLAX 2D LVIDd:         3.60 cm  Diastology LVIDs:         2.70 cm  LV e' medial:    7.94 cm/s LV PW:         0.90 cm  LV E/e' medial:  8.8 LV IVS:        1.00 cm  LV e' lateral:   8.27 cm/s LVOT diam:     1.90 cm  LV E/e' lateral: 8.4 LVOT Area:     2.84 cm  RIGHT VENTRICLE RV S prime:     4.03 cm/s LEFT ATRIUM         Index LA diam:    2.60 cm 1.46 cm/m   AORTA Ao Root diam: 3.10 cm MITRAL VALVE MV Area (PHT): 4.89 cm    SHUNTS MV Decel Time: 155 msec    Systemic Diam: 1.90 cm MV E velocity: 69.80 cm/s MV A velocity: 56.10 cm/s MV E/A ratio:  1.24 Epifanio Lesches MD Electronically signed by Epifanio Lesches MD Signature Date/Time: 03/26/2020/11:38:24 PM    Final

## 2020-03-28 NOTE — Progress Notes (Signed)
Pharmacy Antibiotic Note  Mike Moran is a 65 y.o. male on day # 5 antibiotics for bacteremia and empyema. Pharmacy has been consulted for Unasyn dosing. Changing from Cefazolin for aspiration pneumonia coverage per ID recommendation.  Staph epi and Staph hominis noted likely contaminants.  Plan: Discontinue Cefazolin Begin Unasyn 3gm IV q6hrs. Follow renal function, culture data, clinical progress and antibiotic plans  Height: 6' (182.9 cm) Weight: 58.8 kg (129 lb 10.1 oz) IBW/kg (Calculated) : 77.6  Temp (24hrs), Avg:98.2 F (36.8 C), Min:98.1 F (36.7 C), Max:98.4 F (36.9 C)  Recent Labs  Lab 03/25/20 0205 03/25/20 0430 03/26/20 0149 03/27/20 0037 03/28/20 0112  WBC 29.5*  --  38.4* 29.4* 15.8*  CREATININE 0.65  --  0.50* 0.62 0.57*  LATICACIDVEN 1.7 0.6  --   --   --     Estimated Creatinine Clearance: 75.5 mL/min (A) (by C-G formula based on SCr of 0.57 mg/dL (L)).    Allergies  Allergen Reactions  . Codeine Nausea And Vomiting  . Lexapro [Escitalopram Oxalate] Other (See Comments)    lethargic    Antimicrobials this admission: Vancomycin 2/15>>2/16 Cefepime 2/15>>2/16 Remdesivir 2/15>>2/17 Cefazolin 2/16>>2/18 Unasyn 2/18>>  Microbiology results: 2/15 blood x 2: 4/4 Staph epi and Staph hominis, sens pending 2/15 COVID: positive 2/15 Influenza: negative 2/15 MRSA PCR: negative 2/15 pleural fluid, right: negative 2/16 blood x 2: no growth x 2 days to date  Thank you for allowing pharmacy to be a part of this patient's care.  Dennie Fetters, [place] 03/28/2020 4:57 PM

## 2020-03-28 NOTE — Plan of Care (Signed)
  Problem: Education: Goal: Knowledge of General Education information will improve Description: Including pain rating scale, medication(s)/side effects and non-pharmacologic comfort measures Outcome: Progressing   Problem: Health Behavior/Discharge Planning: Goal: Ability to manage health-related needs will improve Outcome: Progressing   Problem: Clinical Measurements: Goal: Ability to maintain clinical measurements within normal limits will improve Outcome: Progressing Goal: Will remain free from infection Outcome: Progressing Goal: Diagnostic test results will improve Outcome: Progressing Goal: Respiratory complications will improve Outcome: Progressing Goal: Cardiovascular complication will be avoided Outcome: Progressing   Problem: Activity: Goal: Risk for activity intolerance will decrease Outcome: Progressing   Problem: Nutrition: Goal: Adequate nutrition will be maintained Outcome: Progressing   Problem: Coping: Goal: Level of anxiety will decrease Outcome: Progressing   Problem: Pain Managment: Goal: General experience of comfort will improve Outcome: Progressing   Problem: Skin Integrity: Goal: Risk for impaired skin integrity will decrease Outcome: Progressing   Problem: Education: Goal: Knowledge of risk factors and measures for prevention of condition will improve Outcome: Progressing   Problem: Coping: Goal: Psychosocial and spiritual needs will be supported Outcome: Progressing   Problem: Respiratory: Goal: Will maintain a patent airway Outcome: Progressing Goal: Complications related to the disease process, condition or treatment will be avoided or minimized Outcome: Progressing

## 2020-03-28 NOTE — Progress Notes (Signed)
NAME:  Mike Moran, MRN:  [id number], DOB:  01-23-1956, LOS: 3 ADMISSION DATE:  03/25/2020, CONSULTATION DATE:  03/25/20 REFERRING MD:  Elgergawy, CHIEF COMPLAINT: loculated pleural effusion    Brief History:  65 yo M + COVID, loculated pleural effusion.   History of Present Illness:  66 yo M PMH Anxiety, Depression, EtOH use presented to [facility] 2/15 with SOB. SOB, cough, back pain on the right began 3 days prior to presentation. Associated body aches and weakness. [place] beach. Pt noted to be hypoxic by EMS and was taken to ED on O2. Tested positive for COVID. CTA chest obtained which revealed a large r pleural effusion, loculated in appearance. Transferred to [facility] 2/15. CRP 19.7 LDH 99 Ferritin 225 PCT 0.32 WBC 29.5.  PCCM consulted for management of loculated pleural effusion.  Past Medical History:  Depression  Anxiety   Significant Hospital Events:  2/15 presented to [facility] for SOB, hypoxia. COVID +. CTA reveals large pleural effusion. Transferred to [facility]. PCCM consulted for pleural effusion   Consults:  PCCM   Procedures:  2/15 Chest Tube >>>  Chest tube lytics 2/15- 2/17  Significant Diagnostic Tests:  2/15 CTA chest > no evidence of PE. Large R pleural effusion with loculation. RML RLL collapse, significant atelectasis. R airway debris. Small 57mm L apical nodule. 5.6cm R adrenal mass (seen on 2010 abdominal ultrasound)   Pleural fluid 2/15> (1075 WBCs, 85% PMNs; LDH 528, protein 4.3) Echo 03/26/20: LVEF 55-60%, mild LVH, normal diastolic function. RV mildly enlarged. Trivial MR & TR.   2/18 CT chest> mostly resolved loculated pleural effusion, smaller but persistent medial apical loculation. Minimal dependent residual effusion. Chest tube remains in appropriate position. Minimal airspace disease. Dilated esophagus with air fluid level.  Micro Data:  2/15 COVID +  2/15 BCx>> GPC 4/4 (staph epi on biofire) 2/16 blood cultures >   Antimicrobials:  2/15  vanc> 2/15 cefepime> 2/15 Cefazolin  2/16 >  2/15 remdesivir>  Interim History / Subjective:  He denies complaints today.   Objective   Blood pressure 106/67, pulse 72, temperature 98.1 F (36.7 C), temperature source Oral, resp. rate 20, height 6' (1.829 m), weight 58.8 kg, SpO2 98 %.        Intake/Output Summary (Last 24 hours) at 03/28/2020 0844 Last data filed at 03/28/2020 0350 Gross per 24 hour  Intake 480 ml  Output 1670 ml  Net -1190 ml   Filed Weights   03/25/20 0141 03/26/20 0500 03/27/20 0452  Weight: 63.5 kg 59.1 kg 58.8 kg    Examination: General: chronically ill appearing man laying in bed in NAD HENT: [place]/AT, eyes anicteric Lungs: breathing comfortably on [place]. >300cc bloody fluid in [place] [facility] since yesterday. Cardiovascular: skin well perfused Abdomen: thin, soft, ND Extremities: no peripheral edema, no clubbing or cyanosis Neuro: awake, alert, globally weak Derm: skin warm, dry  Resolved Hospital Problem list     Assessment & Plan:   Acute respiratory failure with hypoxia Large R sided loculated pleural effusion; mostly resolved with TPA and dornase. Persistent medial apical collection. Fluid cultures NG so far. CT with remarkably minimal airspace disease.  COVID-19 infection Sepsis- unclear if due to bacteremia or pleural process. -Repeat blood cultures from 2/16 pending; had echo without obvious vegetation but without a known organism causing his pulmonary pathology, it is concerning for a hematogenous source of infection with staph epi, which is not a normal respiratory tract pathogen. -CTS consult- appreciate their input. Esophagram today to evaluate  [place]. -Con't chest tube to -20cm H20 suction; ok to travel to radiology on water seal. Output too high in past 24 hours to remove today. -appreciate ID's input- con't antibiotics -airborne/contact precautions for covid -Covid-19 therapies per primary team. Remdesivir, steroids, Vit C, Zinc   -trending inflammatory markers -Supplemental O2 as needed, IS -oxycodone 7.5mg  Q6h PRN + morphine IV for breakthrough for pain control while chest tube remains in place  Depression Anxiety Sleep Disturbance -takes cymbalta, atarax, risperidol, trazodone at home  Best practice (evaluated daily)  Diet: NPO Pain/Anxiety/Delirium protocol (if indicated):  VAP protocol (if indicated):  DVT prophylaxis: SCDs GI prophylaxis: na Glucose control: SSI Mobility: per primary Disposition: per primary  Goals of Care:  Last date of multidisciplinary goals of care discussion: Per primary Code Status: Full   Labs   CBC: Recent Labs  Lab 03/25/20 0205 03/26/20 0149 03/27/20 0037 03/28/20 0112  WBC 29.5* 38.4* 29.4* 15.8*  NEUTROABS 27.5* 35.9* 26.4* 12.6*  HGB 11.6* 11.7* 10.9* 11.8*  HCT 34.5* 35.6* 30.9* 33.4*  MCV 91.0 90.1 87.3 87.7  PLT 467* 468* 471* 463*    Basic Metabolic Panel: Recent Labs  Lab 03/25/20 0205 03/26/20 0149 03/27/20 0037 03/28/20 0112  NA 135 135 137 134*  K 3.6 4.2 4.4 3.9  CL 101 102 101 99  CO2 26 23 26 28   GLUCOSE 133* 171* 131* 105*  BUN 20 21 22 20   CREATININE 0.65 0.50* 0.62 0.57*  CALCIUM 8.9 8.9 8.8* 8.2*  MG  --  1.8 2.1 1.9  PHOS  --  3.8 3.6 3.5   GFR: Estimated Creatinine Clearance: 75.5 mL/min (A) (by C-G formula based on SCr of 0.57 mg/dL (L)). Recent Labs  Lab 03/25/20 0205 03/25/20 0430 03/26/20 0149 03/27/20 0037 03/28/20 0112  PROCALCITON 0.32  --   --   --   --   WBC 29.5*  --  38.4* 29.4* 15.8*  LATICACIDVEN 1.7 0.6  --   --   --     Liver Function Tests: Recent Labs  Lab 03/25/20 0205 03/25/20 1409 03/26/20 0149 03/27/20 0037 03/28/20 0112  AST 16  --  14* 38 25  ALT 14  --  14 35 34  ALKPHOS 50  --  58 51 45  BILITOT 0.5  --  0.6 0.4 0.8  PROT 6.7 6.3* 6.0* 5.7* 5.3*  ALBUMIN 3.1*  --  2.5* 2.4* 2.3*   Recent Labs  Lab 03/25/20 0205  LIPASE 26   No results for input(s): AMMONIA in the last 168  hours.  ABG No results found for: PHART, PCO2ART, PO2ART, HCO3, TCO2, ACIDBASEDEF, O2SAT   Coagulation Profile: No results for input(s): INR, PROTIME in the last 168 hours.  Cardiac Enzymes: No results for input(s): CKTOTAL, CKMB, CKMBINDEX, TROPONINI in the last 168 hours.  HbA1C: Hgb A1c MFr Bld  Date/Time Value Ref Range Status  01/10/2014 06:19 AM 5.6 <5.7 % Final    Comment:    (NOTE)                                                                       According to the ADA Clinical Practice Recommendations for 2011, when HbA1c is used as a screening test:  >=6.5%  Diagnostic of Diabetes Mellitus           (if abnormal result is confirmed) 5.7-6.4%   Increased risk of developing Diabetes Mellitus References:Diagnosis and Classification of Diabetes Mellitus,Diabetes Care,2011,34(Suppl 1):S62-S69 and Standards of Medical Care in         Diabetes - 2011,Diabetes Care,2011,34 (Suppl 1):S11-S61.     CBG: Recent Labs  Lab 03/27/20 0744 03/27/20 1137 03/27/20 1702 03/27/20 2108 03/28/20 0757  GLUCAP 125* 119* 81 127* 135*    [address] [place], DO 03/28/20 8:44 AM [facility]  From 7AM- 7PM if no response to pager, please call [phone]. After hours, 7PM- 7AM, please call Elink  [phone].

## 2020-03-28 NOTE — Progress Notes (Signed)
Electrophysiology has evaluated Mike Moran.  They would like to hold on any temporary pacing needs.  They have recommended to avoid coughing or any excess pain.  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   He also has staph epidermidis bacteremia.  Critical care medicine believes this could be real and not a contaminant.  He does need a transesophageal echocardiogram he will need to be off isolation precautions.  Moving forward, cardiology will follow along as needed.  Please notify us if you have questions.  If it is felt that he ultimately needs a transesophageal echocardiogram to exclude endocarditis, please reinvolve [place] in his care.  Mike Spore T. Flora Lipps, MD, [facility]  [facility]  [address], [address] [place], [place] [address] [phone]  10:17 AM

## 2020-03-28 NOTE — Progress Notes (Signed)
Bilateral lower extremity venous duplex has been completed. Preliminary results can be found in CV Proc through chart review.   03/28/20 2:14 PM Olen Cordial RVT

## 2020-03-28 NOTE — Consult Note (Addendum)
[facility]                                                                                        Patient Identification: Patient Name: Mike Moran MRN: [id number] Admit Date: 03/25/2020  1:35 AM Today's Date: 03/28/2020 Reason for consult: bacteremia and empyema  Requesting provider:   Principal Problem:   Pneumonia due to COVID-19 virus Active Problems:   Respiratory failure with hypoxia (HCC)   Pleural effusion on right   Sepsis (HCC)   Leukocytosis   Normocytic anemia   Thrombocytosis   Pulmonary nodule   Alcohol abuse   Elevated brain natriuretic peptide (BNP) level   AV heart block   Antibiotics: Vancomycin 2/14-2/16                    Cefepime 2/14-2/16                    Cefazolin 2/16- c   Lines/Tubes: PIVs, RT chest tube   Assessment # Rt sided pleural effusion with concerns for  Empyema9 ? Aspiration related) S/p RT sided chest tube. PF exudative.cultures NG so far  Pulmonary and CT sx following Repeat Ct chest improving effusion    # Staph epi and staph hominis in blood cultures Highly likely these are contaminants given 2 different organism which are common skin contaminants. Both blood cultures are drawn from the same site.  No known hardware  TTE no vegetations Although Native valve endocarditis with staph epidermidis can occur, however, these are rare   Alcohol use COVID PNA   Recommendations  Would recommend to change cefazolin to Unasyn given high concerns for aspiration  Fu CT sx recs Fu repeat blood cx and PF cultures  Monitor CBC and BMP on IV antibiotics COVID management per primary Isolation precautions per Infection Prevention   Will follow peripherally over the weekend.   Rest of the management as per the primary team. Please call with questions or concerns.  Thank you for the  consult _______________________________________________________________________________________________________ HPI and Hospital Course: Mike Moran is a 65 y.o. male with a PMHof anxiety, depression and alcohol use who presented to [facility] ED on 2/15 with  cough, SOB and chest tightness for 3 days  On arrival to the ED, he was hypoxic and needed to be on supplemental oxygen.  Febrile with Tmax 100.7 WBC 29 9( left  shift) SARSCoV2 RTCR positive  Abnromal chest xray and CT chest findings as below with RT pleural effusion  Chest tube inserted on 2/15. PF is exudative. Cultures NGTD  Pulmonary and CT sx  has been consulted  S/p Chest tube placement on 2/15. PF is exudative. No organisms in Gram stain and cultures NGTD.   ROS General- Denies fever, chills, LOSS OF APPETITE AND LOSS OF WEIGHT+ HEENT - Denies headache, blurry vision, neck pain, sinus pain Chest - Denies any chest pain, SOB and COUGH+ CVS- Denies any dizziness/lightheadedness, syncopal attacks, palpitations Abdomen- Denies any nausea, vomiting, abdominal pain, hematochezia and diarrhea Neuro - Denies any weakness, numbness, tingling sensation, GENERALISED WEAKNESS+ Psych - Denies any changes in mood  irritability or depressive symptoms GU- Denies any burning, dysuria, hematuria or increased frequency of urination Skin - denies any rashes/lesions MSK - denies any joint pain/swelling or restricted ROM   Past Medical History:  Diagnosis Date  . Anxiety   . Depression     Scheduled Meds: . albuterol  2 puff Inhalation Q6H  . vitamin C  500 mg Oral Daily  . DULoxetine  60 mg Oral Daily  . enoxaparin (LOVENOX) injection  30 mg Subcutaneous BID  . insulin aspart  0-15 Units Subcutaneous TID WC  . insulin aspart  0-5 Units Subcutaneous QHS  . pantoprazole  40 mg Oral QHS  . phosphorus  250 mg Oral BID  . psyllium  1 packet Oral Daily  . risperiDONE  0.5 mg Oral QHS  . sodium chloride flush  10 mL Intracatheter Q8H  .  thiamine  100 mg Oral Daily  . traZODone  100 mg Oral QHS  . zinc sulfate  220 mg Oral Daily   Continuous Infusions: .  ceFAZolin (ANCEF) IV 2 g (03/28/20 0508)   PRN Meds:.acetaminophen, bisacodyl, guaiFENesin-dextromethorphan, hydrOXYzine, morphine injection, oxyCODONE  Allergies  Allergen Reactions  . Codeine Nausea And Vomiting  . Lexapro [Escitalopram Oxalate] Other (See Comments)    lethargic   Social History   Socioeconomic History  . Marital status: Married    Spouse name: Not on file  . Number of children: Not on file  . Years of education: Not on file  . Highest education level: Not on file  Occupational History  . Not on file  Tobacco Use  . Smoking status: Former Games developer  . Smokeless tobacco: Never Used  Substance and Sexual Activity  . Alcohol use: Yes    Alcohol/week: 0.0 standard drinks    Comment: occ- pt denies SA use  . Drug use: No  . Sexual activity: Not on file  Other Topics Concern  . Not on file  Social History Narrative  . Not on file   Social Determinants of Health   Financial Resource Strain: Not on file  Food Insecurity: Not on file  Transportation Needs: Not on file  Physical Activity: Not on file  Stress: Not on file  Social Connections: Not on file  Intimate Partner Violence: Not on file     Vitals BP 106/67 (BP Location: Right Arm)   Pulse 72   Temp 98.1 F (36.7 C) (Oral)   Resp 20   Ht 6' (1.829 m)   Wt 58.8 kg   SpO2 98%   BMI 17.58 kg/m    Physical Exam Elderly male lying in bed, thin and malnourished Pale conjunctiva HEENT - dry [place], on [place], pale conjunctiva Chest - coarse breath sounds b/l, Rt sided chest tube+ CVS- s1s2, murmur+ Abdomen - soft, NT Extremities no pedal edema  Pertinent Microbiology Results for orders placed or performed during the hospital encounter of 03/25/20  Culture, blood (Routine X 2) w Reflex to ID Panel     Status: Abnormal (Preliminary result)   Collection Time: 03/25/20  2:05 AM    Specimen: Right Antecubital; Blood  Result Value Ref Range Status   Specimen Description   Final    RIGHT ANTECUBITAL Performed at [facility], [address] [place], [place] [address]    Special Requests   Final    BAA Blood Culture adequate volume Performed at [facility], [address] [place], [place] [address]    Culture  Setup Time   Final  GRAM POSITIVE COCCI IN BOTH AEROBIC AND ANAEROBIC BOTTLES Gram Stain Report Called to,Read Back By and Verified With: KNECHT,LINDSAY @ [facility] 5 W @2327  BY MATTHEWS, B 2.15.22 Performed at [facility], [address], [place], [place]    Culture (A)  Final    STAPHYLOCOCCUS HOMINIS STAPHYLOCOCCUS EPIDERMIDIS    Report Status PENDING  Incomplete  Resp Panel by RT-PCR (Flu A&B, Covid) Nasopharyngeal Swab     Status: Abnormal   Collection Time: 03/25/20  2:10 AM   Specimen: Nasopharyngeal Swab; Nasopharyngeal(NP) swabs in vial transport medium  Result Value Ref Range Status   SARS Coronavirus 2 by RT PCR POSITIVE (A) NEGATIVE Final    Comment: RESULT CALLED TO, READ BACK BY AND VERIFIED WITH: WALKER,T@0326  BY MATTHEWS,B 2.15.22 (NOTE) SARS-CoV-2 target nucleic acids are DETECTED.  The SARS-CoV-2 RNA is generally detectable in upper respiratory specimens during the acute phase of infection. Positive results are indicative of the presence of the identified virus, but do not rule out bacterial infection or co-infection with other pathogens not detected by the test. Clinical correlation with patient history and other diagnostic information is necessary to determine patient infection status. The expected result is Negative.  Fact Sheet for Patients: 06-18-1984  Fact Sheet for Healthcare Providers: BloggerCourse.com  This test is not yet approved or cleared by the [employer]iousBroker.[place] FDA and  has been authorized for detection and/or diagnosis of SARS-CoV-2 by FDA  under an Emergency Use Authorization (EUA).  This EUA will remain in effect (meaning this test can  be used) for the duration of  the COVID-19 declaration under Section 564(b)(1) of the Act, 21 U.S.C. section 360bbb-3(b)(1), unless the authorization is terminated or revoked sooner.     Influenza A by PCR NEGATIVE NEGATIVE Final   Influenza B by PCR NEGATIVE NEGATIVE Final    Comment: (NOTE) The Xpert Xpress SARS-CoV-2/FLU/RSV plus assay is intended as an aid in the diagnosis of influenza from Nasopharyngeal swab specimens and should not be used as a sole basis for treatment. Nasal washings and aspirates are unacceptable for Xpert Xpress SARS-CoV-2/FLU/RSV testing.  Fact Sheet for Patients: [place]  Fact Sheet for Healthcare Providers: BloggerCourse.com  This test is not yet approved or cleared by the Serious[employer]ker.it FDA and has been authorized for detection and/or diagnosis of SARS-CoV-2 by FDA under an Emergency Use Authorization (EUA). This EUA will remain in effect (meaning this test can be used) for the duration of the COVID-19 declaration under Section 564(b)(1) of the Act, 21 U.S.C. section 360bbb-3(b)(1), unless the authorization is terminated or revoked.  Performed at [facility], [address] [place], [place]   Culture, blood (Routine X 2) w Reflex to ID Panel     Status: Abnormal (Preliminary result)   Collection Time: 03/25/20  2:45 AM   Specimen: Right Antecubital; Blood  Result Value Ref Range Status   Specimen Description   Final    RIGHT ANTECUBITAL Performed at [facility], [address], [place], [place]    Special Requests   Final    BAA Blood Culture adequate volume Performed at [facility], [address], [place], [place]    Culture  Setup Time   Final    GRAM POSITIVE COCCI IN BOTH AEROBIC AND ANAEROBIC BOTTLES Gram Stain Report Called to,Read Back By and  Verified With: [email or web address] 5w@2109  by matthews,b 2.15.22 Organism ID to follow CRITICAL RESULT CALLED TO, READ BACK BY AND VERIFIED WITH: J LEDFORD PHARMD  03/26/20 0451 JDW    Culture (A)  Final    STAPHYLOCOCCUS HOMINIS STAPHYLOCOCCUS EPIDERMIDIS SUSCEPTIBILITIES TO FOLLOW Performed at [facility], [address], [place], [place] [address]    Report Status PENDING  Incomplete  Blood Culture ID Panel (Reflexed)     Status: Abnormal   Collection Time: 03/25/20  2:45 AM  Result Value Ref Range Status   Enterococcus faecalis NOT DETECTED NOT DETECTED Final   Enterococcus Faecium NOT DETECTED NOT DETECTED Final   Listeria monocytogenes NOT DETECTED NOT DETECTED Final   Staphylococcus species DETECTED (A) NOT DETECTED Final    Comment: CRITICAL RESULT CALLED TO, READ BACK BY AND VERIFIED WITH: J LEDFORD PHARMD 03/26/20 0451 JDW    Staphylococcus aureus (BCID) NOT DETECTED NOT DETECTED Final   Staphylococcus epidermidis DETECTED (A) NOT DETECTED Final    Comment: CRITICAL RESULT CALLED TO, READ BACK BY AND VERIFIED WITH: J LEDFORD PHARMD 03/26/20 0451 JDW    Staphylococcus lugdunensis NOT DETECTED NOT DETECTED Final   Streptococcus species NOT DETECTED NOT DETECTED Final   Streptococcus agalactiae NOT DETECTED NOT DETECTED Final   Streptococcus pneumoniae NOT DETECTED NOT DETECTED Final   Streptococcus pyogenes NOT DETECTED NOT DETECTED Final   A.calcoaceticus-baumannii NOT DETECTED NOT DETECTED Final   Bacteroides fragilis NOT DETECTED NOT DETECTED Final   Enterobacterales NOT DETECTED NOT DETECTED Final   Enterobacter cloacae complex NOT DETECTED NOT DETECTED Final   Escherichia coli NOT DETECTED NOT DETECTED Final   Klebsiella aerogenes NOT DETECTED NOT DETECTED Final   Klebsiella oxytoca NOT DETECTED NOT DETECTED Final   Klebsiella pneumoniae NOT DETECTED NOT DETECTED Final   Proteus species NOT DETECTED NOT DETECTED Final   Salmonella species NOT DETECTED  NOT DETECTED Final   Serratia marcescens NOT DETECTED NOT DETECTED Final   Haemophilus influenzae NOT DETECTED NOT DETECTED Final   Neisseria meningitidis NOT DETECTED NOT DETECTED Final   Pseudomonas aeruginosa NOT DETECTED NOT DETECTED Final   Stenotrophomonas maltophilia NOT DETECTED NOT DETECTED Final   Candida albicans NOT DETECTED NOT DETECTED Final   Candida auris NOT DETECTED NOT DETECTED Final   Candida glabrata NOT DETECTED NOT DETECTED Final   Candida krusei NOT DETECTED NOT DETECTED Final   Candida parapsilosis NOT DETECTED NOT DETECTED Final   Candida tropicalis NOT DETECTED NOT DETECTED Final   Cryptococcus neoformans/gattii NOT DETECTED NOT DETECTED Final   Methicillin resistance mecA/C NOT DETECTED NOT DETECTED Final    Comment: Performed at [facility], [address], [place], [place] [address]  MRSA PCR Screening     Status: None   Collection Time: 03/25/20  9:59 AM   Specimen: Nasal Mucosa; Nasopharyngeal  Result Value Ref Range Status   MRSA by PCR NEGATIVE NEGATIVE Final    Comment:        The GeneXpert MRSA Assay (FDA approved for NASAL specimens only), is one component of a comprehensive MRSA colonization surveillance program. It is not intended to diagnose MRSA infection nor to guide or monitor treatment for MRSA infections. Performed at [facility], [address], [place], [place] [address]   Body fluid culture w Gram Stain     Status: None   Collection Time: 03/25/20  4:45 PM   Specimen: Pleura; Body Fluid  Result Value Ref Range Status   Specimen Description PLEURAL FLUID RIGHT  Final   Special Requests Normal  Final   Gram Stain   Final    RARE WBC PRESENT, PREDOMINANTLY MONONUCLEAR NO ORGANISMS  SEEN    Culture   Final    NO GROWTH Performed at [facility], [address] [place], [place] [address]    Report Status 03/28/2020 FINAL  Final  Culture, blood (routine x 2)     Status: None (Preliminary result)   Collection  Time: 03/26/20 11:00 AM   Specimen: BLOOD  Result Value Ref Range Status   Specimen Description BLOOD SITE NOT SPECIFIED  Final   Special Requests   Final    BOTTLES DRAWN AEROBIC ONLY Blood Culture adequate volume   Culture   Final    NO GROWTH < 24 HOURS Performed at [facility], [address] [place], [place] [address]    Report Status PENDING  Incomplete  Culture, blood (routine x 2)     Status: None (Preliminary result)   Collection Time: 03/26/20 11:07 AM   Specimen: BLOOD LEFT ARM  Result Value Ref Range Status   Specimen Description BLOOD LEFT ARM  Final   Special Requests   Final    BOTTLES DRAWN AEROBIC ONLY Blood Culture results may not be optimal due to an inadequate volume of blood received in culture bottles   Culture   Final    NO GROWTH < 24 HOURS Performed at [facility], [address] [place], [place] [address]    Report Status PENDING  Incomplete     Pertinent Lab seen by me: CBC Latest Ref Rng & Units 03/28/2020 03/27/2020 03/26/2020  WBC 4.0 - 10.5 K/uL 15.8(H) 29.4(H) 38.4(H)  Hemoglobin 13.0 - 17.0 g/dL 11.8(L) 10.9(L) 11.7(L)  Hematocrit 39.0 - 52.0 % 33.4(L) 30.9(L) 35.6(L)  Platelets 150 - 400 K/uL 463(H) 471(H) 468(H)   CMP Latest Ref Rng & Units 03/28/2020 03/27/2020 03/26/2020  Glucose 70 - 99 mg/dL 914(N105(H) 829(F131(H) 621(H171(H)  BUN 8 - 23 mg/dL 20 22 21   Creatinine 0.61 - 1.24 mg/dL 0.86(V0.57(L) 7.840.62 6.96(E0.50(L)  Sodium 135 - 145 mmol/L 134(L) 137 135  Potassium 3.5 - 5.1 mmol/L 3.9 4.4 4.2  Chloride 98 - 111 mmol/L 99 101 102  CO2 22 - 32 mmol/L 28 26 23   Calcium 8.9 - 10.3 mg/dL 8.2(L) 8.8(L) 8.9  Total Protein 6.5 - 8.1 g/dL 5.3(L) 5.7(L) 6.0(L)  Total Bilirubin 0.3 - 1.2 mg/dL 0.8 0.4 0.6  Alkaline Phos 38 - 126 U/L 45 51 58  AST 15 - 41 U/L 25 38 14(L)  ALT 0 - 44 U/L 34 35 14     Pertinent Imagings/Other Imagings Plain films and CT images have been personally visualized and interpreted; radiology reports have been reviewed. Decision  making incorporated into the Impression / Recommendations.  TTE 03/26/20 1. Left ventricular ejection fraction, by estimation, is 55 to 60%. The left ventricle has normal function. The left ventricle has no regional wall motion abnormalities. There is mild left ventricular hypertrophy. Left ventricular diastolic parameters were normal. 2. Right ventricular systolic function is normal. The right ventricular size is mildly enlarged. Tricuspid regurgitation signal is inadequate for assessing PA pressure. 3. A small pericardial effusion is present. 4. The mitral valve is normal in structure. Trivial mitral valve regurgitation. 5. The aortic valve is tricuspid. Aortic valve regurgitation is not visualized. No aortic stenosis is present. 6. The inferior vena cava is normal in size with <50% respiratory variability, suggesting right atrial pressure of 8 mmHg.   CT angio chest PE 03/25/20 FINDINGS: Cardiovascular: Satisfactory opacification of the pulmonary arteries to the segmental level. No evidence of pulmonary embolism.  Normal heart size. No pericardial effusion. Atheromatous calcification of the aorta  Mediastinum/Nodes: No visible adenopathy or mass.  Lungs/Pleura: Large right pleural effusion with loculation causing scalloping of the lung. The apicomedial opacity by radiography is also loculated pleural fluid. No visible mass. Extensive atelectasis in the right lung with collapsed lower and middle lobes. Secretions/debris seen in the central right-sided airways. Mild left-sided atelectasis. Tiny left apical pulmonary nodule measuring 3 mm, which can be re-evaluated at follow-up.  Upper Abdomen: Right adrenal mass which is partially covered at 5.6 cm with low-density appearance and coarse calcifications. A right suprarenal mass of similar size was described on an abdominal ultrasound April 21, 2008.  Musculoskeletal: No bony erosion adjacent to the pleural fluid.  Review of  the MIP images confirms the above findings.  IMPRESSION: 1. Large and loculated right pleural effusion with extensive right pulmonary collapse. No visible underlying tumor; empyema is the leading consideration. 2. Partially covered 5.6 cm right adrenal mass, chronic and benign based on a 2010 abdominal ultrasound.   Chest Xray 03/25/20 FINDINGS: Large right pleural effusion. Basilar atelectasis. Left lung is clear. Medial opacity at the right lung apex.  IMPRESSION: 1. Large right pleural effusion and basilar atelectasis. 2. Medial opacity at the right lung apex. CT recommended for better characterization.  TTE 03/26/20 1. Left ventricular ejection fraction, by estimation, is 55 to 60%. The left ventricle has normal function. The left ventricle has no regional wall motion abnormalities. There is mild left ventricular hypertrophy. Left ventricular diastolic parameters were normal. 2. Right ventricular systolic function is normal. The right ventricular size is mildly enlarged. Tricuspid regurgitation signal is inadequate for assessing PA pressure. 3. A small pericardial effusion is present. 4. The mitral valve is normal in structure. Trivial mitral valve regurgitation. 5. The aortic valve is tricuspid. Aortic valve regurgitation is not visualized. No aortic stenosis is present. 6. The inferior vena cava is normal  I have spent 60 minutes for this patient encounter including review of prior medical records with greater than 50% of time being face to face and coordination of their care.  Electronically signed by:   Odette Fraction, MD Infectious Disease Physician [facility] for Infectious Disease Pager: [phone]

## 2020-03-29 DIAGNOSIS — J90 Pleural effusion, not elsewhere classified: Secondary | ICD-10-CM | POA: Diagnosis not present

## 2020-03-29 LAB — CULTURE, BLOOD (ROUTINE X 2)
Special Requests: ADEQUATE
Special Requests: ADEQUATE

## 2020-03-29 LAB — CBC WITH DIFFERENTIAL/PLATELET
Abs Immature Granulocytes: 0.13 10*3/uL — ABNORMAL HIGH (ref 0.00–0.07)
Basophils Absolute: 0 10*3/uL (ref 0.0–0.1)
Basophils Relative: 0 %
Eosinophils Absolute: 0.2 10*3/uL (ref 0.0–0.5)
Eosinophils Relative: 2 %
HCT: 33.9 % — ABNORMAL LOW (ref 39.0–52.0)
Hemoglobin: 11.2 g/dL — ABNORMAL LOW (ref 13.0–17.0)
Immature Granulocytes: 1 %
Lymphocytes Relative: 13 %
Lymphs Abs: 1.9 10*3/uL (ref 0.7–4.0)
MCH: 29.2 pg (ref 26.0–34.0)
MCHC: 33 g/dL (ref 30.0–36.0)
MCV: 88.5 fL (ref 80.0–100.0)
Monocytes Absolute: 1.4 10*3/uL — ABNORMAL HIGH (ref 0.1–1.0)
Monocytes Relative: 10 %
Neutro Abs: 10.8 10*3/uL — ABNORMAL HIGH (ref 1.7–7.7)
Neutrophils Relative %: 74 %
Platelets: 490 10*3/uL — ABNORMAL HIGH (ref 150–400)
RBC: 3.83 MIL/uL — ABNORMAL LOW (ref 4.22–5.81)
RDW: 14.2 % (ref 11.5–15.5)
WBC: 14.4 10*3/uL — ABNORMAL HIGH (ref 4.0–10.5)
nRBC: 0 % (ref 0.0–0.2)

## 2020-03-29 LAB — GLUCOSE, CAPILLARY
Glucose-Capillary: 105 mg/dL — ABNORMAL HIGH (ref 70–99)
Glucose-Capillary: 162 mg/dL — ABNORMAL HIGH (ref 70–99)
Glucose-Capillary: 119 mg/dL — ABNORMAL HIGH (ref 70–99)
Glucose-Capillary: 144 mg/dL — ABNORMAL HIGH (ref 70–99)

## 2020-03-29 LAB — COMPREHENSIVE METABOLIC PANEL
ALT: 24 U/L (ref 0–44)
AST: 13 U/L — ABNORMAL LOW (ref 15–41)
Albumin: 2.2 g/dL — ABNORMAL LOW (ref 3.5–5.0)
Alkaline Phosphatase: 41 U/L (ref 38–126)
Anion gap: 6 (ref 5–15)
BUN: 17 mg/dL (ref 8–23)
CO2: 31 mmol/L (ref 22–32)
Calcium: 8.4 mg/dL — ABNORMAL LOW (ref 8.9–10.3)
Chloride: 100 mmol/L (ref 98–111)
Creatinine, Ser: 0.56 mg/dL — ABNORMAL LOW (ref 0.61–1.24)
GFR, Estimated: >60 mL/min (ref >60)
Glucose, Bld: 114 mg/dL — ABNORMAL HIGH (ref 70–99)
Potassium: 4.1 mmol/L (ref 3.5–5.1)
Sodium: 137 mmol/L (ref 135–145)
Total Bilirubin: 0.7 mg/dL (ref 0.3–1.2)
Total Protein: 5.3 g/dL — ABNORMAL LOW (ref 6.5–8.1)

## 2020-03-29 LAB — C-REACTIVE PROTEIN: CRP: 10.1 mg/dL — ABNORMAL HIGH (ref <1.0)

## 2020-03-29 LAB — D-DIMER, QUANTITATIVE: D-Dimer, Quant: 2.49 ug/mL-FEU — ABNORMAL HIGH (ref 0.00–0.50)

## 2020-03-29 LAB — FERRITIN: Ferritin: 361 ng/mL — ABNORMAL HIGH (ref 24–336)

## 2020-03-29 LAB — MAGNESIUM: Magnesium: 2.1 mg/dL (ref 1.7–2.4)

## 2020-03-29 NOTE — Progress Notes (Signed)
Security called to search pt's belongings after vape found in room. Security able to search belongings w/ pt consent. Stated that they were unable to find other contraband items. Pt. Informed that vape and other belongings will be returned at discharge.

## 2020-03-29 NOTE — Progress Notes (Signed)
Earlier today a vape pen was found in patient's possession. This afternoon the patient received tens unit from home.  RN inspected tens unit and found heating pad had been cut open and taped over with a vape pen inside. [facility] informed of situation. Per AC, pt no longer allowed to receive items from outside. Pt informed of situation by RN.

## 2020-03-29 NOTE — Progress Notes (Signed)
RN found vape on bedside table this morning, explained to pt it was against hospital policy and dangerous, especially since he is on supplemental oxygen and has serious cardiac issues.  Previously, he was found to have a pocket knife in his possession.  Pt spent much of the day asking for heat packs, heating pad for his reported shoulder injury.  He indicated he was going to have his daughter bring his heating pad at home.  Educated pt on need for grounding prong in order to adhere to hospital guidelines.  About 1700, family dropped off a bag of personal possessions.  Upon inspection, a vape was found inside the rolled up heating pad, the interior pad slit open in the corner, the vape inside, and then tapped shut.  Additionally there were two more appliances without 3-prong plug.  Both AC and security were notified.  AC made decision that pt may not receive any outside packages for the remainder of his stay.  Pt was informed of vape finding, the escalation to security and [facility], as well as rule that no future items may be delivered to him.  Pt very angry, demanding his heating pad, denies knowledge of the vape.  RN offered to ask MD for nicotine patch, but warned that cardiac issues may prevent an order for this.  Pt refused to have RN call MD.  Atarax given per prn orders. Pt verbally consistently verbally abusive to staff.

## 2020-03-29 NOTE — Progress Notes (Signed)
   NAME:  Mike Moran, MRN:  [id number], DOB:  11-Mar-1955, LOS: 4 ADMISSION DATE:  03/25/2020, CONSULTATION DATE:  03/25/20 REFERRING MD:  Elgergawy, CHIEF COMPLAINT: loculated pleural effusion    Brief History:  65 yo M + COVID, loculated pleural effusion.   History of Present Illness:  65 yo M PMH Anxiety, Depression, EtOH use presented to [facility] 2/15 with SOB. SOB, cough, back pain on the right began 3 days prior to presentation. Associated body aches and weakness. [place] beach. Pt noted to be hypoxic by EMS and was taken to ED on O2. Tested positive for COVID. CTA chest obtained which revealed a large r pleural effusion, loculated in appearance. Transferred to [facility] 2/15. CRP 19.7 LDH 99 Ferritin 225 PCT 0.32 WBC 29.5.  PCCM consulted for management of loculated pleural effusion.  Past Medical History:  Depression  Anxiety   Significant Hospital Events:  2/15 presented to [facility] for SOB, hypoxia. COVID +. CTA reveals large pleural effusion. Transferred to [facility]. PCCM consulted for pleural effusion   Consults:  PCCM   Procedures:  2/15 Chest Tube >>>  Chest tube lytics 2/15- 2/17  Significant Diagnostic Tests:  2/15 CTA chest > no evidence of PE. Large R pleural effusion with loculation. RML RLL collapse, significant atelectasis. R airway debris. Small 94mm L apical nodule. 5.6cm R adrenal mass (seen on 2010 abdominal ultrasound)   Pleural fluid 2/15> (1075 WBCs, 85% PMNs; LDH 528, protein 4.3) Echo 03/26/20: LVEF 55-60%, mild LVH, normal diastolic function. RV mildly enlarged. Trivial MR & TR.   2/18 CT chest> mostly resolved loculated pleural effusion, smaller but persistent medial apical loculation. Minimal dependent residual effusion. Chest tube remains in appropriate position. Minimal airspace disease. Dilated esophagus with air fluid level.  Micro Data:  2/15 COVID +  2/15 BCx>> GPC 4/4 (staph epi on biofire) 2/16 blood cultures >   Antimicrobials:  2/15  vanc> 2/15 cefepime> 2/15 Cefazolin  2/16 >  2/15 remdesivir>  Interim History / Subjective:  Denies complaints Wants to go home Minimal chest tube output  Objective   Blood pressure 94/64, pulse 89, temperature 98.2 F (36.8 C), temperature source Oral, resp. rate 20, height 6' (1.829 m), weight 58.8 kg, SpO2 100 %.        Intake/Output Summary (Last 24 hours) at 03/29/2020 1436 Last data filed at 03/29/2020 0736 Gross per 24 hour  Intake 230 ml  Output 1110 ml  Net -880 ml   Filed Weights   03/25/20 0141 03/26/20 0500 03/27/20 0452  Weight: 63.5 kg 59.1 kg 58.8 kg    Examination: Constitutional: no acute distress  Eyes: EOMI, pupils reactive Ears, nose, mouth, and throat: MMM, trachea midline Cardiovascular: RRR, ext warm Respiratory: Diminished R, chest tube without air leak or tidaling Gastrointestinal: Soft, +BS Skin: No rashes, normal turgor Neurologic: Moves all 4 ext to command Psychiatric: RASS 0  Esophagram- dysfunction without leak  Resolved Hospital Problem list     Assessment & Plan:  Loculated R effusion likely related to aspiration event- dramatic improvement on imaging, minimal that remains can likely just scar down. COVID + Esophageal dysmotility EtOH abuse history Staph epi bacteremia  - DC chest tube - TEE and EGD once off precautions - Encourage IS, doing well with this - Abx per ID - PCCM available PRN, do not feel strongly that repeat chest imaging is needed unless symptomatic  Myrla Halsted MD PCCM

## 2020-03-29 NOTE — Plan of Care (Signed)
  Problem: Education: Goal: Knowledge of General Education information will improve Description: Including pain rating scale, medication(s)/side effects and non-pharmacologic comfort measures Outcome: Progressing   Problem: Health Behavior/Discharge Planning: Goal: Ability to manage health-related needs will improve Outcome: Progressing   Problem: Clinical Measurements: Goal: Ability to maintain clinical measurements within normal limits will improve Outcome: Progressing Goal: Will remain free from infection Outcome: Progressing Goal: Diagnostic test results will improve Outcome: Progressing Goal: Respiratory complications will improve Outcome: Progressing Goal: Cardiovascular complication will be avoided Outcome: Progressing   Problem: Activity: Goal: Risk for activity intolerance will decrease Outcome: Progressing   Problem: Nutrition: Goal: Adequate nutrition will be maintained Outcome: Progressing   Problem: Pain Managment: Goal: General experience of comfort will improve Outcome: Progressing   Problem: Skin Integrity: Goal: Risk for impaired skin integrity will decrease Outcome: Progressing   Problem: Education: Goal: Knowledge of risk factors and measures for prevention of condition will improve Outcome: Progressing   Problem: Respiratory: Goal: Will maintain a patent airway Outcome: Progressing Goal: Complications related to the disease process, condition or treatment will be avoided or minimized Outcome: Progressing   Problem: Coping: Goal: Level of anxiety will decrease Outcome: Not Progressing   Problem: Coping: Goal: Psychosocial and spiritual needs will be supported Outcome: Not Progressing

## 2020-03-29 NOTE — Progress Notes (Addendum)
PROGRESS NOTE                                                                                                                                                                                                             Patient Demographics:    Mike Moran, is a 65 y.o. male, DOB - [birth year], PNT:[id number]  Outpatient Primary MD for the patient is [name], MD   Admit date - 03/25/2020   LOS - 4  Chief Complaint  Patient presents with  . Shortness of Breath       Brief Narrative: Patient is a 65 y.o. male with PMHx of anxiety/depression-presented to [facility] on 2/15 with shortness of breath-found to have large right-sided loculated pleural effusion and COVID-19 infection.   COVID-19 vaccinated status: Unvaccinated  Significant Events: 2/15>> Admit to [facility] for shortness of breath-found to have a large right-sided loculated pleural effusion and COVID-19 infection  2/16 >> transferred to [facility]   Significant studies: 2/15>> chest x-ray: Large right-sided pleural effusion 2/15>> CTA chest: Large/loculated right pleural effusion with extensive pulmonary collapse.  5.6 right adrenal mass 2/16>> Echo: EF 55-60%-no obvious vegetation seen on valves. 2/18>> CT chest without contrast: Effusion is significantly smaller compared to prior exam, dilated/fluid-filled esophagus 2/18>> barium esophagogram: Moderate esophageal dysmotility-no stricture/obstruction 2/18>> bilateral lower extremity Doppler: Negative for DVT.   COVID-19 medications: Steroids: 2/15>> 2/16 Remdesivir: 2/14>>2/17  Antibiotics: Vancomycin: 2/14>> 2/16 Cefepime: 2/14>>2/15 Ancef: 2/16>> 2/18 Unasyn: 2/18>>  Microbiology data: 2/15 >>blood culture: Staph hominis, staph epidermidis 2/15>> pleural fluid culture: No growth  2/16>> blood culture: No growth  Procedures: 2/15>> chest tube insertion  Consults: CCM,cards  DVT prophylaxis: enoxaparin  (LOVENOX) injection 30 mg Start: 03/27/20 1230 SCDs Start: 03/25/20 0502    Subjective:   No major issues overnight-lying comfortably in bed.  Inquiring when he can go home.   Assessment  & Plan :   Acute Hypoxic Resp Failure due to loculated large right-sided pleural effusion-likely empyema: No major issues overnight-seen by ID yesterday-switch to Unasyn-could have had pneumonia/empyema due to aspiration given CT findings/esophagogram findings.  PCCM following.  Per CT surgery-not a surgical candidate.    Coag negative staph bacteremia: Per ID-this could be a contaminant-repeat cultures negative so far.  Switch to IV Unasyn.  Echo without vegetations   Paroxysmal AV block: Continue telemetry monitoring-avoid AV nodal  blocking agents-cardiology suspect that this is vagally mediated-if he becomes more symptomatic-cardiology thinking of placing a temporary pacer.    COVID-19 infection: Suspect mostly incidental finding-completed 3 days of Remdesivir.    Fever: afebrile O2 requirements:  SpO2: 100 % O2 Flow Rate (L/min): 2 L/min   COVID-19 Labs: Recent Labs    03/27/20 0037 03/28/20 0112 03/29/20 0329  DDIMER 3.39* 2.66* 2.49*  FERRITIN 412* 331 361*  CRP 12.1* 7.4* 10.1*       Component Value Date/Time   BNP 189.0 (H) 03/25/2020 0205    Recent Labs  Lab 03/25/20 0205  PROCALCITON 0.32    Lab Results  Component Value Date   SARSCOV2NAA POSITIVE (A) 03/25/2020     Prone/Incentive Spirometry: encouraged incentive spirometry use 3-4/hour.  Elevated D-dimer: On prophylactic Lovenox-check lower extremity Doppler.  Dilated esophagus on CT: Barium esophagogram negative for obstruction-suspicion for motility disorder.  Will need outpatient EGD  Right adrenal mass: Chronic-benign based on 2010 abdominal ultrasound  Alcohol abuse: Claims he quit 10 years back-watch closely-no signs of withdrawal.  3 mm left apical nodule: Stable for outpatient  follow-up  Depression/anxiety: Stable today-continue trazodone/Cymbalta and Atarax.    Constipation:   ABG: No results found for: PHART, PCO2ART, PO2ART, HCO3, TCO2, ACIDBASEDEF, O2SAT  Vent Settings: N/A  Condition -Guarded  Family Communication  : Called spouse Boyd Kerbs [phone]-on 2/19.  Code Status :  Full Code  Diet :  Diet Order            Diet regular Room service appropriate? Yes; Fluid consistency: Thin  Diet effective now                  Disposition Plan  :   Status is: Inpatient  Remains inpatient appropriate because:Inpatient level of care appropriate due to severity of illness   Dispo: The patient is from: Home              Anticipated d/c is to: Home              Anticipated d/c date is: > 3 days              Patient currently is not medically stable to d/c.   Difficult to place patient No   Barriers to discharge: Empyema with bacteremia-chest tube in place-on IV antibiotics  Antimicorbials  :    Anti-infectives (From admission, onward)   Start     Dose/Rate Route Frequency Ordered Stop   03/28/20 1800  Ampicillin-Sulbactam (UNASYN) 3 g in sodium chloride 0.9 % 100 mL IVPB        3 g 200 mL/hr over 30 Minutes Intravenous Every 6 hours 03/28/20 1657     03/26/20 1400  ceFAZolin (ANCEF) IVPB 2g/100 mL premix  Status:  Discontinued        2 g 200 mL/hr over 30 Minutes Intravenous Every 8 hours 03/26/20 1114 03/28/20 1657   03/26/20 1000  remdesivir 100 mg in sodium chloride 0.9 % 100 mL IVPB        100 mg 200 mL/hr over 30 Minutes Intravenous Daily 03/25/20 0347 03/27/20 1027   03/25/20 2200  vancomycin (VANCOCIN) IVPB 1000 mg/200 mL premix  Status:  Discontinued        1,000 mg 200 mL/hr over 60 Minutes Intravenous Every 12 hours 03/25/20 0510 03/26/20 1114   03/25/20 1400  ceFEPIme (MAXIPIME) 2 g in sodium chloride 0.9 % 100 mL IVPB  Status:  Discontinued  2 g 200 mL/hr over 30 Minutes Intravenous Every 8 hours 03/25/20 0530  03/26/20 1114   03/25/20 0500  ceFEPIme (MAXIPIME) 2 g in sodium chloride 0.9 % 100 mL IVPB        2 g 200 mL/hr over 30 Minutes Intravenous  Once 03/25/20 0446 03/25/20 0638   03/25/20 0500  vancomycin (VANCOCIN) IVPB 1000 mg/200 mL premix        1,000 mg 200 mL/hr over 60 Minutes Intravenous  Once 03/25/20 0452 03/25/20 0723   03/25/20 0400  remdesivir 100 mg in sodium chloride 0.9 % 100 mL IVPB        100 mg 200 mL/hr over 30 Minutes Intravenous Every 30 min 03/25/20 0347 03/25/20 0527      Inpatient Medications  Scheduled Meds: . albuterol  2 puff Inhalation Q6H  . vitamin C  500 mg Oral Daily  . DULoxetine  60 mg Oral Daily  . enoxaparin (LOVENOX) injection  30 mg Subcutaneous BID  . insulin aspart  0-15 Units Subcutaneous TID WC  . insulin aspart  0-5 Units Subcutaneous QHS  . pantoprazole  40 mg Oral QHS  . phosphorus  250 mg Oral BID  . polyethylene glycol  17 g Oral Daily  . psyllium  1 packet Oral Daily  . risperiDONE  0.5 mg Oral QHS  . senna  2 tablet Oral QHS  . senna-docusate  2 tablet Oral Once  . sodium chloride flush  10 mL Intracatheter Q8H  . thiamine  100 mg Oral Daily  . traZODone  100 mg Oral QHS  . zinc sulfate  220 mg Oral Daily   Continuous Infusions: . ampicillin-sulbactam (UNASYN) IV 3 g (03/29/20 1317)   PRN Meds:.acetaminophen, bisacodyl, guaiFENesin-dextromethorphan, hydrOXYzine, morphine injection, ondansetron (ZOFRAN) IV, oxyCODONE   Time Spent in minutes  25  See all Orders from today for further details   Jeoffrey Massed M.D on 03/29/2020 at 2:27 PM  To page go to [email or web address] - use universal password  [facility] -  Office  [phone]    Objective:   Vitals:   03/28/20 0455 03/28/20 1500 03/28/20 2014 03/28/20 2130  BP: 106/67 110/69 102/71   Pulse: 72 66 88   Resp: 20 12 20    Temp: 98.1 F (36.7 C) 98.2 F (36.8 C) 98.1 F (36.7 C)   TempSrc: Oral Oral Axillary   SpO2: 98% 100% 100% 100%  Weight:       Height:        Wt Readings from Last 3 Encounters:  03/27/20 58.8 kg  05/20/19 68 kg  11/23/17 75.8 kg     Intake/Output Summary (Last 24 hours) at 03/29/2020 1427 Last data filed at 03/29/2020 0736 Gross per 24 hour  Intake 230 ml  Output 1110 ml  Net -880 ml     Physical Exam Gen Exam:Alert awake-not in any distress HEENT:atraumatic, normocephalic Chest: B/L clear to auscultation anteriorly CVS:S1S2 regular Abdomen:soft non tender, non distended Extremities:no edema Neurology: Non focal Skin: no rash   Data Review:    CBC Recent Labs  Lab 03/25/20 0205 03/26/20 0149 03/27/20 0037 03/28/20 0112 03/29/20 0329  WBC 29.5* 38.4* 29.4* 15.8* 14.4*  HGB 11.6* 11.7* 10.9* 11.8* 11.2*  HCT 34.5* 35.6* 30.9* 33.4* 33.9*  PLT 467* 468* 471* 463* 490*  MCV 91.0 90.1 87.3 87.7 88.5  MCH 30.6 29.6 30.8 31.0 29.2  MCHC 33.6 32.9 35.3 35.3 33.0  RDW 14.6 14.5 14.6 14.3 14.2  LYMPHSABS 0.5* 0.4* 0.8 1.5 1.9  MONOABS 1.3* 1.4* 1.8* 1.5* 1.4*  EOSABS 0.0 0.0 0.0 0.1 0.2  BASOSABS 0.1 0.1 0.1 0.0 0.0    Chemistries  Recent Labs  Lab 03/25/20 0205 03/26/20 0149 03/27/20 0037 03/28/20 0112 03/29/20 0329  NA 135 135 137 134* 137  K 3.6 4.2 4.4 3.9 4.1  CL 101 102 101 99 100  CO2 26 23 26 28 31   GLUCOSE 133* 171* 131* 105* 114*  BUN 20 21 22 20 17   CREATININE 0.65 0.50* 0.62 0.57* 0.56*  CALCIUM 8.9 8.9 8.8* 8.2* 8.4*  MG  --  1.8 2.1 1.9 2.1  AST 16 14* 38 25 13*  ALT 14 14 35 34 24  ALKPHOS 50 58 51 45 41  BILITOT 0.5 0.6 0.4 0.8 0.7   ------------------------------------------------------------------------------------------------------------------ No results for input(s): CHOL, HDL, LDLCALC, TRIG, CHOLHDL, LDLDIRECT in the last 72 hours.  Lab Results  Component Value Date   HGBA1C 5.6 01/10/2014   ------------------------------------------------------------------------------------------------------------------ No results for input(s): TSH, T4TOTAL,  T3FREE, THYROIDAB in the last 72 hours.  Invalid input(s): FREET3 ------------------------------------------------------------------------------------------------------------------ Recent Labs    03/28/20 0112 03/29/20 0329  FERRITIN 331 361*    Coagulation profile No results for input(s): INR, PROTIME in the last 168 hours.  Recent Labs    03/28/20 0112 03/29/20 0329  DDIMER 2.66* 2.49*    Cardiac Enzymes No results for input(s): CKMB, TROPONINI, MYOGLOBIN in the last 168 hours.  Invalid input(s): CK ------------------------------------------------------------------------------------------------------------------    Component Value Date/Time   BNP 189.0 (H) 03/25/2020 0205    Micro Results Recent Results (from the past 240 hour(s))  Culture, blood (Routine X 2) w Reflex to ID Panel     Status: Abnormal   Collection Time: 03/25/20  2:05 AM   Specimen: Right Antecubital; Blood  Result Value Ref Range Status   Specimen Description   Final    RIGHT ANTECUBITAL Performed at [facility], [address] [place], [place] [address]    Special Requests   Final    BAA Blood Culture adequate volume Performed at [facility], [address], [place], [place] [address]    Culture  Setup Time   Final    GRAM POSITIVE COCCI IN BOTH AEROBIC AND ANAEROBIC BOTTLES Gram Stain Report Called to,Read Back By and Verified With: KNECHT,LINDSAY @ [facility] W @2327  BY MATTHEWS, B 2.15.22    Culture (A)  Final    STAPHYLOCOCCUS HOMINIS STAPHYLOCOCCUS EPIDERMIDIS SUSCEPTIBILITIES PERFORMED ON PREVIOUS CULTURE WITHIN THE LAST 5 DAYS. Performed at [facility], [address], [place], [place] [address]    Report Status 03/29/2020 FINAL  Final  Resp Panel by RT-PCR (Flu A&B, Covid) Nasopharyngeal Swab     Status: Abnormal   Collection Time: 03/25/20  2:10 AM   Specimen: Nasopharyngeal Swab; Nasopharyngeal(NP) swabs in vial transport medium  Result Value Ref Range Status   SARS  Coronavirus 2 by RT PCR POSITIVE (A) NEGATIVE Final    Comment: RESULT CALLED TO, READ BACK BY AND VERIFIED WITH: WALKER,T@0326  BY MATTHEWS,B 2.15.22 (NOTE) SARS-CoV-2 target nucleic acids are DETECTED.  The SARS-CoV-2 RNA is generally detectable in upper respiratory specimens during the acute phase of infection. Positive results are indicative of the presence of the identified virus, but do not rule out bacterial infection or co-infection with other pathogens not detected by the test. Clinical correlation with patient history and other diagnostic information is necessary to determine patient infection status. The expected result is Negative.  Fact Sheet for Patients: BloggerCourse.com  Fact Sheet for Healthcare Providers: SeriousBroker.it  This test is not yet approved or cleared by the [place] FDA and  has been authorized for detection and/or diagnosis of SARS-CoV-2 by FDA under an Emergency Use Authorization (EUA).  This EUA will remain in effect (meaning this test can  be used) for the duration of  the COVID-19 declaration under Section 564(b)(1) of the Act, 21 U.S.C. section 360bbb-3(b)(1), unless the authorization is terminated or revoked sooner.     Influenza A by PCR NEGATIVE NEGATIVE Final   Influenza B by PCR NEGATIVE NEGATIVE Final    Comment: (NOTE) The Xpert Xpress SARS-CoV-2/FLU/RSV plus assay is intended as an aid in the diagnosis of influenza from Nasopharyngeal swab specimens and should not be used as a sole basis for treatment. Nasal washings and aspirates are unacceptable for Xpert Xpress SARS-CoV-2/FLU/RSV testing.  Fact Sheet for Patients: BloggerCourse.com  Fact Sheet for Healthcare Providers: SeriousBroker.it  This test is not yet approved or cleared by the [place] FDA and has been authorized for detection and/or diagnosis of SARS-CoV-2  by FDA under an Emergency Use Authorization (EUA). This EUA will remain in effect (meaning this test can be used) for the duration of the COVID-19 declaration under Section 564(b)(1) of the Act, 21 U.S.C. section 360bbb-3(b)(1), unless the authorization is terminated or revoked.  Performed at [facility], [address] [place], [place] [address]   Culture, blood (Routine X 2) w Reflex to ID Panel     Status: Abnormal   Collection Time: 03/25/20  2:45 AM   Specimen: Right Antecubital; Blood  Result Value Ref Range Status   Specimen Description   Final    RIGHT ANTECUBITAL Performed at [facility], [address], [place], [place] [address]    Special Requests   Final    BAA Blood Culture adequate volume Performed at [facility], [address] [place], [place] [address]    Culture  Setup Time   Final    GRAM POSITIVE COCCI IN BOTH AEROBIC AND ANAEROBIC BOTTLES Gram Stain Report Called to,Read Back By and Verified With: [email or web address] 5w@2109  by matthews,b 2.15.22 Organism ID to follow CRITICAL RESULT CALLED TO, READ BACK BY AND VERIFIED WITH: Melven Sartorius [facility] 03/26/20 0451 JDW Performed at [facility], [address] [place], [place] [address]    Culture (A)  Final    STAPHYLOCOCCUS HOMINIS STAPHYLOCOCCUS EPIDERMIDIS    Report Status 03/29/2020 FINAL  Final   Organism ID, Bacteria STAPHYLOCOCCUS HOMINIS  Final   Organism ID, Bacteria STAPHYLOCOCCUS EPIDERMIDIS  Final      Susceptibility   Staphylococcus epidermidis - MIC*    CIPROFLOXACIN <=0.5 SENSITIVE Sensitive     ERYTHROMYCIN <=0.25 SENSITIVE Sensitive     GENTAMICIN <=0.5 SENSITIVE Sensitive     OXACILLIN <=0.25 SENSITIVE Sensitive     TETRACYCLINE <=1 SENSITIVE Sensitive     VANCOMYCIN 1 SENSITIVE Sensitive     TRIMETH/SULFA <=10 SENSITIVE Sensitive     CLINDAMYCIN <=0.25 SENSITIVE Sensitive     RIFAMPIN <=0.5 SENSITIVE Sensitive     Inducible Clindamycin NEGATIVE Sensitive     *  STAPHYLOCOCCUS EPIDERMIDIS   Staphylococcus hominis - MIC*    CIPROFLOXACIN <=0.5 SENSITIVE Sensitive     ERYTHROMYCIN >=8 RESISTANT Resistant     GENTAMICIN <=0.5 SENSITIVE Sensitive     OXACILLIN <=0.25 SENSITIVE Sensitive     TETRACYCLINE 2 SENSITIVE Sensitive     VANCOMYCIN 1 SENSITIVE Sensitive     TRIMETH/SULFA <=10 SENSITIVE Sensitive  CLINDAMYCIN <=0.25 SENSITIVE Sensitive     RIFAMPIN <=0.5 SENSITIVE Sensitive     Inducible Clindamycin NEGATIVE Sensitive     * STAPHYLOCOCCUS HOMINIS  Blood Culture ID Panel (Reflexed)     Status: Abnormal   Collection Time: 03/25/20  2:45 AM  Result Value Ref Range Status   Enterococcus faecalis NOT DETECTED NOT DETECTED Final   Enterococcus Faecium NOT DETECTED NOT DETECTED Final   Listeria monocytogenes NOT DETECTED NOT DETECTED Final   Staphylococcus species DETECTED (A) NOT DETECTED Final    Comment: CRITICAL RESULT CALLED TO, READ BACK BY AND VERIFIED WITH: J LEDFORD PHARMD 03/26/20 0451 JDW    Staphylococcus aureus (BCID) NOT DETECTED NOT DETECTED Final   Staphylococcus epidermidis DETECTED (A) NOT DETECTED Final    Comment: CRITICAL RESULT CALLED TO, READ BACK BY AND VERIFIED WITH: J LEDFORD PHARMD 03/26/20 0451 JDW    Staphylococcus lugdunensis NOT DETECTED NOT DETECTED Final   Streptococcus species NOT DETECTED NOT DETECTED Final   Streptococcus agalactiae NOT DETECTED NOT DETECTED Final   Streptococcus pneumoniae NOT DETECTED NOT DETECTED Final   Streptococcus pyogenes NOT DETECTED NOT DETECTED Final   A.calcoaceticus-baumannii NOT DETECTED NOT DETECTED Final   Bacteroides fragilis NOT DETECTED NOT DETECTED Final   Enterobacterales NOT DETECTED NOT DETECTED Final   Enterobacter cloacae complex NOT DETECTED NOT DETECTED Final   Escherichia coli NOT DETECTED NOT DETECTED Final   Klebsiella aerogenes NOT DETECTED NOT DETECTED Final   Klebsiella oxytoca NOT DETECTED NOT DETECTED Final   Klebsiella pneumoniae NOT DETECTED NOT  DETECTED Final   Proteus species NOT DETECTED NOT DETECTED Final   Salmonella species NOT DETECTED NOT DETECTED Final   Serratia marcescens NOT DETECTED NOT DETECTED Final   Haemophilus influenzae NOT DETECTED NOT DETECTED Final   Neisseria meningitidis NOT DETECTED NOT DETECTED Final   Pseudomonas aeruginosa NOT DETECTED NOT DETECTED Final   Stenotrophomonas maltophilia NOT DETECTED NOT DETECTED Final   Candida albicans NOT DETECTED NOT DETECTED Final   Candida auris NOT DETECTED NOT DETECTED Final   Candida glabrata NOT DETECTED NOT DETECTED Final   Candida krusei NOT DETECTED NOT DETECTED Final   Candida parapsilosis NOT DETECTED NOT DETECTED Final   Candida tropicalis NOT DETECTED NOT DETECTED Final   Cryptococcus neoformans/gattii NOT DETECTED NOT DETECTED Final   Methicillin resistance mecA/C NOT DETECTED NOT DETECTED Final    Comment: Performed at [facility], [address] [place], [place] [address]  MRSA PCR Screening     Status: None   Collection Time: 03/25/20  9:59 AM   Specimen: Nasal Mucosa; Nasopharyngeal  Result Value Ref Range Status   MRSA by PCR NEGATIVE NEGATIVE Final    Comment:        The GeneXpert MRSA Assay (FDA approved for NASAL specimens only), is one component of a comprehensive MRSA colonization surveillance program. It is not intended to diagnose MRSA infection nor to guide or monitor treatment for MRSA infections. Performed at [facility], [address] [place], [place] [address]   Body fluid culture w Gram Stain     Status: None   Collection Time: 03/25/20  4:45 PM   Specimen: Pleura; Body Fluid  Result Value Ref Range Status   Specimen Description PLEURAL FLUID RIGHT  Final   Special Requests Normal  Final   Gram Stain   Final    RARE WBC PRESENT, PREDOMINANTLY MONONUCLEAR NO ORGANISMS SEEN    Culture   Final    NO GROWTH Performed  at [facility], [address] [place], [place] [address]    Report Status  03/28/2020 FINAL  Final  Culture, blood (routine x 2)     Status: None (Preliminary result)   Collection Time: 03/26/20 11:00 AM   Specimen: BLOOD  Result Value Ref Range Status   Specimen Description BLOOD SITE NOT SPECIFIED  Final   Special Requests   Final    BOTTLES DRAWN AEROBIC ONLY Blood Culture adequate volume   Culture   Final    NO GROWTH 3 DAYS Performed at [facility], [address] [place], [place] [address]    Report Status PENDING  Incomplete  Culture, blood (routine x 2)     Status: None (Preliminary result)   Collection Time: 03/26/20 11:07 AM   Specimen: BLOOD LEFT ARM  Result Value Ref Range Status   Specimen Description BLOOD LEFT ARM  Final   Special Requests   Final    BOTTLES DRAWN AEROBIC ONLY Blood Culture results may not be optimal due to an inadequate volume of blood received in culture bottles   Culture   Final    NO GROWTH 3 DAYS Performed at [facility], [address] [place], [place] [address]    Report Status PENDING  Incomplete    Radiology Reports CT CHEST WO CONTRAST  Result Date: 03/28/2020 CLINICAL DATA:  Pleural effusion. EXAM: CT CHEST WITHOUT CONTRAST TECHNIQUE: Multidetector CT imaging of the chest was performed following the standard protocol without IV contrast. COMPARISON:  March 27, 2020.  March 25, 2020. FINDINGS: Cardiovascular: Atherosclerosis of thoracic aorta is noted without aneurysm formation. Normal cardiac size. No pericardial effusion. Mild coronary artery calcifications are noted. Mediastinum/Nodes: No adenopathy is noted. Thyroid gland is unremarkable. Dilated and fluid-filled esophagus is noted. There appears to be aspirated material within the trachea. Lungs/Pleura: No pneumothorax is noted. Interval placement of pigtail drainage catheter into right pleural effusion noted on prior exam. The effusion is significantly smaller compared to prior exam, a small residual effusion and adjacent atelectasis or  infiltrate seen posteriorly in the right lower lobe. Mild left posterior basilar subsegmental atelectasis is noted. Upper Abdomen: Stable partially calcified right adrenal mass is noted. Musculoskeletal: No chest wall mass or suspicious bone lesions identified. IMPRESSION: 1. Interval placement of pigtail drainage catheter into right pleural effusion noted on prior exam. The effusion is significantly smaller compared to prior exam, a small residual effusion and adjacent atelectasis or infiltrate seen posteriorly in the right lower lobe. 2. There appears to be aspirated material within the trachea. 3. Dilated and fluid-filled esophagus is noted. Distal obstruction cannot be excluded. 4. Stable partially calcified right adrenal mass is noted. 5. Mild coronary artery calcifications are noted. 6. Aortic atherosclerosis. Aortic Atherosclerosis (ICD10-I70.0). Electronically Signed   By: Lupita Raider M.D.   On: 03/28/2020 07:58   CT ANGIO CHEST PE W OR WO CONTRAST  Result Date: 03/25/2020 CLINICAL DATA:  Shortness of breath. EXAM: CT ANGIOGRAPHY CHEST WITH CONTRAST TECHNIQUE: Multidetector CT imaging of the chest was performed using the standard protocol during bolus administration of intravenous contrast. Multiplanar CT image reconstructions and MIPs were obtained to evaluate the vascular anatomy. CONTRAST:  OMNIPAQUE IOHEXOL 350 MG/ML SOLN COMPARISON:  None. FINDINGS: Cardiovascular: Satisfactory opacification of the pulmonary arteries to the segmental level. No evidence of pulmonary embolism. Normal heart size. No pericardial effusion. Atheromatous calcification of the aorta Mediastinum/Nodes: No visible adenopathy or mass. Lungs/Pleura: Large right pleural effusion with  loculation causing scalloping of the lung. The apicomedial opacity by radiography is also loculated pleural fluid. No visible mass. Extensive atelectasis in the right lung with collapsed lower and middle lobes. Secretions/debris seen in the  central right-sided airways. Mild left-sided atelectasis. Tiny left apical pulmonary nodule measuring 3 mm, which can be re-evaluated at follow-up. Upper Abdomen: Right adrenal mass which is partially covered at 5.6 cm with low-density appearance and coarse calcifications. A right suprarenal mass of similar size was described on an abdominal ultrasound April 21, 2008. Musculoskeletal: No bony erosion adjacent to the pleural fluid. Review of the MIP images confirms the above findings. IMPRESSION: 1. Large and loculated right pleural effusion with extensive right pulmonary collapse. No visible underlying tumor; empyema is the leading consideration. 2. Partially covered 5.6 cm right adrenal mass, chronic and benign based on a 2010 abdominal ultrasound. Electronically Signed   By: Marnee Spring M.D.   On: 03/25/2020 04:20   DG CHEST PORT 1 VIEW  Result Date: 03/27/2020 CLINICAL DATA:  Pleural effusion.  Chest tube. EXAM: PORTABLE CHEST 1 VIEW COMPARISON:  03/26/2020. FINDINGS: Right chest tube in stable position. No pneumothorax noted on today's exam. Stable right base and right upper medial loculated pleural effusions. Persistent bibasilar atelectasis. Heart size stable. IMPRESSION: Right chest tube in stable position. No pneumothorax noted on today's exam. Stable right base and right upper medial loculated pleural effusions. Electronically Signed   By: Maisie Fus  Register   On: 03/27/2020 07:24   DG CHEST PORT 1 VIEW  Result Date: 03/26/2020 CLINICAL DATA:  Pleural effusion.  Chest tube. EXAM: PORTABLE CHEST 1 VIEW COMPARISON:  Chest x-ray 03/25/2020.  CT chest 03/25/2020. FINDINGS: Right chest tube in stable position. Tiny right apical pneumothorax again noted without interim change. Continued improvement in right base pleural effusion. Persistent right upper medial loculated pleural effusion again noted. Right base atelectasis/infiltrate. Heart size stable. IMPRESSION: 1. Right chest tube in stable position.  Tiny right apical pneumothorax again noted without interim change. Continued improvement in right base pleural effusion. Persistent right upper medial loculated pleural effusion again noted. 2.  Right base atelectasis/infiltrate. Electronically Signed   By: Maisie Fus  Register   On: 03/26/2020 07:05   DG CHEST PORT 1 VIEW  Result Date: 03/25/2020 CLINICAL DATA:  Chest tube. EXAM: PORTABLE CHEST 1 VIEW COMPARISON:  CT 03/25/2020.  Chest x-ray 03/25/2020. FINDINGS: Interim placement right chest tube. Interim significant improvement right-sided pleural effusion with mild to moderate residual. Loculated pleural fluid component appears to be present over the upper medial chest. Right base atelectasis. Heart size stable. IMPRESSION: Interim placement of right chest tube. Interim significant improvement of right-sided pleural effusion with mild to moderate residual. Loculated pleural fluid component appears to be present over the upper medial chest. Associated very tiny right apical pneumothorax cannot be excluded on today's exam. Critical Value/emergent results were called by telephone at the time of interpretation on 03/25/2020 at 1:27 pm to nurse Lenda Kelp, who verbally acknowledged these results. Electronically Signed   By: Maisie Fus  Register   On: 03/25/2020 13:28   DG Chest Port 1 View  Result Date: 03/25/2020 CLINICAL DATA:  Cough and shortness of breath EXAM: PORTABLE CHEST 1 VIEW COMPARISON:  01/09/2014 FINDINGS: Large right pleural effusion. Basilar atelectasis. Left lung is clear. Medial opacity at the right lung apex. IMPRESSION: 1. Large right pleural effusion and basilar atelectasis. 2. Medial opacity at the right lung apex. CT recommended for better characterization. Electronically Signed   By: Chrisandra Netters.D.  On: 03/25/2020 02:13   VAS [place] LOWER EXTREMITY VENOUS (DVT)  Result Date: 03/29/2020  Lower Venous DVT Study Indications: Elevated Ddimer.  Risk Factors: COVID 19 positive. Limitations: Body  habitus and poor ultrasound/tissue interface. Comparison Study: No prior studies. Performing Technologist: Chanda Busing RVT  Examination Guidelines: A complete evaluation includes B-mode imaging, spectral Doppler, color Doppler, and power Doppler as needed of all accessible portions of each vessel. Bilateral testing is considered an integral part of a complete examination. Limited examinations for reoccurring indications may be performed as noted. The reflux portion of the exam is performed with the patient in reverse Trendelenburg.  +---------+---------------+---------+-----------+----------+--------------+ RIGHT    CompressibilityPhasicitySpontaneityPropertiesThrombus Aging +---------+---------------+---------+-----------+----------+--------------+ CFV      Full           Yes      Yes                                 +---------+---------------+---------+-----------+----------+--------------+ SFJ      Full                                                        +---------+---------------+---------+-----------+----------+--------------+ FV Prox  Full                                                        +---------+---------------+---------+-----------+----------+--------------+ FV Mid   Full                                                        +---------+---------------+---------+-----------+----------+--------------+ FV DistalFull                                                        +---------+---------------+---------+-----------+----------+--------------+ PFV      Full                                                        +---------+---------------+---------+-----------+----------+--------------+ POP      Full           Yes      Yes                                 +---------+---------------+---------+-----------+----------+--------------+ PTV      Full                                                         +---------+---------------+---------+-----------+----------+--------------+ PERO  Full                                                        +---------+---------------+---------+-----------+----------+--------------+   +---------+---------------+---------+-----------+----------+--------------+ LEFT     CompressibilityPhasicitySpontaneityPropertiesThrombus Aging +---------+---------------+---------+-----------+----------+--------------+ CFV      Full           Yes      Yes                                 +---------+---------------+---------+-----------+----------+--------------+ SFJ      Full                                                        +---------+---------------+---------+-----------+----------+--------------+ FV Prox  Full                                                        +---------+---------------+---------+-----------+----------+--------------+ FV Mid   Full                                                        +---------+---------------+---------+-----------+----------+--------------+ FV DistalFull                                                        +---------+---------------+---------+-----------+----------+--------------+ PFV      Full                                                        +---------+---------------+---------+-----------+----------+--------------+ POP      Full           Yes      Yes                                 +---------+---------------+---------+-----------+----------+--------------+ PTV      Full                                                        +---------+---------------+---------+-----------+----------+--------------+ PERO     Full                                                        +---------+---------------+---------+-----------+----------+--------------+  Summary: RIGHT: - There is no evidence of deep vein thrombosis in the lower extremity.  - No cystic structure found in  the popliteal fossa.  LEFT: - There is no evidence of deep vein thrombosis in the lower extremity.  - No cystic structure found in the popliteal fossa.  *See table(s) above for measurements and observations. Electronically signed by Heath Lark on 03/29/2020 at 10:58:15 AM.    Final    ECHOCARDIOGRAM LIMITED  Result Date: 03/26/2020    ECHOCARDIOGRAM LIMITED REPORT   Patient Name:   KARLA VINES Date of Exam: 03/26/2020 Medical Rec #:  [id number]      Height:       72.0 in Accession #:    [id number]     Weight:       130.3 lb Date of Birth:  [birth year]      BSA:          1.776 m Patient Age:    64 years       BP:           92/64 mmHg Patient Gender: M              HR:           73 bpm. Exam Location:  Inpatient Procedure: Limited Echo, Cardiac Doppler and Color Doppler Indications:    Bradycardia  History:        Patient has no prior history of Echocardiogram examinations.                 Arrythmias:Bradycardia; Signs/Symptoms:Shortness of Breath.                 Alcohol abuse, Loculated Right pleural effusion, chest tube                 present, COVID+.  Sonographer:    Lavenia Atlas Referring Phys: [id number] Ronnald Ramp O'NEAL IMPRESSIONS  1. Left ventricular ejection fraction, by estimation, is 55 to 60%. The left ventricle has normal function. The left ventricle has no regional wall motion abnormalities. There is mild left ventricular hypertrophy. Left ventricular diastolic parameters were normal.  2. Right ventricular systolic function is normal. The right ventricular size is mildly enlarged. Tricuspid regurgitation signal is inadequate for assessing PA pressure.  3. A small pericardial effusion is present.  4. The mitral valve is normal in structure. Trivial mitral valve regurgitation.  5. The aortic valve is tricuspid. Aortic valve regurgitation is not visualized. No aortic stenosis is present.  6. The inferior vena cava is normal in size with <50% respiratory variability, suggesting right atrial  pressure of 8 mmHg. FINDINGS  Left Ventricle: Left ventricular ejection fraction, by estimation, is 55 to 60%. The left ventricle has normal function. The left ventricle has no regional wall motion abnormalities. The left ventricular internal cavity size was small. There is mild left ventricular hypertrophy. Left ventricular diastolic parameters were normal. Right Ventricle: The right ventricular size is mildly enlarged. Right ventricular systolic function is normal. Tricuspid regurgitation signal is inadequate for assessing PA pressure. Pericardium: A small pericardial effusion is present. Mitral Valve: The mitral valve is normal in structure. Trivial mitral valve regurgitation. Tricuspid Valve: The tricuspid valve is normal in structure. Tricuspid valve regurgitation is trivial. Aortic Valve: The aortic valve is tricuspid. Aortic valve regurgitation is not visualized. No aortic stenosis is present. Aorta: The aortic root is normal in size and structure. Venous: The inferior vena cava is normal in size with less than 50% respiratory variability,  suggesting right atrial pressure of 8 mmHg. LEFT VENTRICLE PLAX 2D LVIDd:         3.60 cm  Diastology LVIDs:         2.70 cm  LV e' medial:    7.94 cm/s LV PW:         0.90 cm  LV E/e' medial:  8.8 LV IVS:        1.00 cm  LV e' lateral:   8.27 cm/s LVOT diam:     1.90 cm  LV E/e' lateral: 8.4 LVOT Area:     2.84 cm  RIGHT VENTRICLE RV S prime:     4.03 cm/s LEFT ATRIUM         Index LA diam:    2.60 cm 1.46 cm/m   AORTA Ao Root diam: 3.10 cm MITRAL VALVE MV Area (PHT): 4.89 cm    SHUNTS MV Decel Time: 155 msec    Systemic Diam: 1.90 cm MV E velocity: 69.80 cm/s MV A velocity: 56.10 cm/s MV E/A ratio:  1.24 Epifanio Lesches MD Electronically signed by Epifanio Lesches MD Signature Date/Time: 03/26/2020/11:38:24 PM    Final    DG ESOPHAGUS W SINGLE CM (SOL OR THIN BA)  Result Date: 03/28/2020 CLINICAL DATA:  Dilated esophagus recent CT. Complicated right pleural  effusion requiring chest tube placement. COVID-19 infection. EXAM: ESOPHOGRAM/BARIUM SWALLOW TECHNIQUE: Single contrast examination was performed using water-soluble contrast. FLUOROSCOPY TIME:  Fluoroscopy Time: 1 minutes and 0 seconds of low-dose pulsed fluoroscopy Radiation Exposure Index (if provided by the fluoroscopic device): 3.5 mGy Number of Acquired Spot Images: 1 COMPARISON:  Chest CT 03/28/2020 and 03/25/2020 FINDINGS: Full COVID precautions were utilized. The study was performed with the patient in the supine and semi erect positions. The patient was only able to swallow small to moderate boluses of contrast. No tracheobronchial aspiration was observed. There is moderate esophageal dysmotility with a decreased primary stripping wave and increased tertiary contractions. No focal mucosal ulceration or stricture identified. Compared with the earlier CT, the esophagus is no longer distended, and no significant fluid is present within the esophageal lumen. There is no evidence of esophageal obstruction or perforation. A barium tablet was not administered. IMPRESSION: Moderate esophageal dysmotility. No evidence of esophageal dilatation, stricture, obstruction or perforation. Endoscopic evaluation may be warranted after COVID restrictions have expired if the patient has persistent unexplained symptoms. Electronically Signed   By: Carey Bullocks M.D.   On: 03/28/2020 15:37

## 2020-03-30 DIAGNOSIS — J869 Pyothorax without fistula: Secondary | ICD-10-CM

## 2020-03-30 LAB — MAGNESIUM: Magnesium: 2.1 mg/dL (ref 1.7–2.4)

## 2020-03-30 LAB — CBC WITH DIFFERENTIAL/PLATELET
Abs Immature Granulocytes: 0.15 10*3/uL — ABNORMAL HIGH (ref 0.00–0.07)
Basophils Absolute: 0 10*3/uL (ref 0.0–0.1)
Basophils Relative: 0 %
Eosinophils Absolute: 0.4 10*3/uL (ref 0.0–0.5)
Eosinophils Relative: 3 %
HCT: 30.8 % — ABNORMAL LOW (ref 39.0–52.0)
Hemoglobin: 10.1 g/dL — ABNORMAL LOW (ref 13.0–17.0)
Immature Granulocytes: 1 %
Lymphocytes Relative: 17 %
Lymphs Abs: 2 10*3/uL (ref 0.7–4.0)
MCH: 29.6 pg (ref 26.0–34.0)
MCHC: 32.8 g/dL (ref 30.0–36.0)
MCV: 90.3 fL (ref 80.0–100.0)
Monocytes Absolute: 1.2 10*3/uL — ABNORMAL HIGH (ref 0.1–1.0)
Monocytes Relative: 10 %
Neutro Abs: 8 10*3/uL — ABNORMAL HIGH (ref 1.7–7.7)
Neutrophils Relative %: 69 %
Platelets: 489 10*3/uL — ABNORMAL HIGH (ref 150–400)
RBC: 3.41 MIL/uL — ABNORMAL LOW (ref 4.22–5.81)
RDW: 14 % (ref 11.5–15.5)
WBC: 11.8 10*3/uL — ABNORMAL HIGH (ref 4.0–10.5)
nRBC: 0 % (ref 0.0–0.2)

## 2020-03-30 LAB — COMPREHENSIVE METABOLIC PANEL
ALT: 19 U/L (ref 0–44)
AST: 15 U/L (ref 15–41)
Albumin: 2.1 g/dL — ABNORMAL LOW (ref 3.5–5.0)
Alkaline Phosphatase: 37 U/L — ABNORMAL LOW (ref 38–126)
Anion gap: 9 (ref 5–15)
BUN: 18 mg/dL (ref 8–23)
CO2: 29 mmol/L (ref 22–32)
Calcium: 8.2 mg/dL — ABNORMAL LOW (ref 8.9–10.3)
Chloride: 97 mmol/L — ABNORMAL LOW (ref 98–111)
Creatinine, Ser: 0.56 mg/dL — ABNORMAL LOW (ref 0.61–1.24)
GFR, Estimated: >60 mL/min (ref >60)
Glucose, Bld: 126 mg/dL — ABNORMAL HIGH (ref 70–99)
Potassium: 3.5 mmol/L (ref 3.5–5.1)
Sodium: 135 mmol/L (ref 135–145)
Total Bilirubin: 0.7 mg/dL (ref 0.3–1.2)
Total Protein: 4.9 g/dL — ABNORMAL LOW (ref 6.5–8.1)

## 2020-03-30 LAB — D-DIMER, QUANTITATIVE: D-Dimer, Quant: 2.12 ug/mL-FEU — ABNORMAL HIGH (ref 0.00–0.50)

## 2020-03-30 LAB — FERRITIN: Ferritin: 338 ng/mL — ABNORMAL HIGH (ref 24–336)

## 2020-03-30 LAB — GLUCOSE, CAPILLARY
Glucose-Capillary: 101 mg/dL — ABNORMAL HIGH (ref 70–99)
Glucose-Capillary: 133 mg/dL — ABNORMAL HIGH (ref 70–99)

## 2020-03-30 LAB — C-REACTIVE PROTEIN: CRP: 5.2 mg/dL — ABNORMAL HIGH (ref <1.0)

## 2020-03-30 MED ORDER — OXYCODONE HCL 5 MG PO TABS: 5.0000 mg | ORAL_TABLET | Freq: Four times a day (QID) | ORAL | 0 refills | Status: DC | PRN

## 2020-03-30 MED ORDER — ALBUTEROL SULFATE HFA 108 (90 BASE) MCG/ACT IN AERS: 2.0000 | INHALATION_SPRAY | Freq: Four times a day (QID) | RESPIRATORY_TRACT | Status: DC | PRN

## 2020-03-30 MED ORDER — ALBUTEROL SULFATE HFA 108 (90 BASE) MCG/ACT IN AERS
2.0000 | INHALATION_SPRAY | Freq: Two times a day (BID) | RESPIRATORY_TRACT | Status: DC
  Administered 2020-03-30: 2 via RESPIRATORY_TRACT

## 2020-03-30 MED ORDER — PANTOPRAZOLE SODIUM 40 MG PO TBEC
40.0000 mg | DELAYED_RELEASE_TABLET | Freq: Every day | ORAL | 0 refills | Status: DC
Start: 2020-03-30 — End: 2020-11-05

## 2020-03-30 MED ORDER — POLYETHYLENE GLYCOL 3350 17 G PO PACK: 17.0000 g | PACK | Freq: Every day | ORAL | 0 refills | Status: DC

## 2020-03-30 MED ORDER — AMOXICILLIN-POT CLAVULANATE 875-125 MG PO TABS: 1.0000 | ORAL_TABLET | Freq: Two times a day (BID) | ORAL | 0 refills | Status: AC

## 2020-03-30 NOTE — Progress Notes (Addendum)
Id Brief Note  Called by primary, Dr Jeoffrey Massed regarding potential PO option for discharge given patient's erratic behavior with the staff and possible plan to discharge.   I have not seen him today. Chart reviewed. Chest tube seems to have removed. Afebrile, leukocytosis downtrending. Repeat blood cx 2/16 NG in 4 days   If planned to discharge, recommend to give him around 3-4 weeks of Augmentin with a FU with PCP or pulmonary.   Odette Fraction, MD Infectious Diseases [facility]

## 2020-03-30 NOTE — Discharge Summary (Signed)
PATIENT DETAILS Name: Mike Moran Age: 65 y.o. Sex: male Date of Birth: 02-04-1956 MRN: [id number]. Admitting Physician: Frankey Shown, DO RFF:[id number], [name], MD  Admit Date: 03/25/2020 Discharge date: 03/30/2020  Recommendations for Outpatient Follow-up:  1. Follow up with PCP in 1-2 weeks 2. Please obtain CMP/CBC in one week 3. Repeat Chest Xray in 4-6 week 4. Please follow cultures until final 5. Needs outpatient GI evaluation-for possible EGD-see below. 6. 3 mm apical nodule in left lung-needs outpatient monitoring by PCP  Admitted From:  Home  Disposition: Home    Home Health: No  Equipment/Devices: None  Discharge Condition: Stable  CODE STATUS: FULL CODE  Diet recommendation:  Diet Order            Diet general           Diet regular Room service appropriate? Yes; Fluid consistency: Thin  Diet effective now                 Brief Narrative: Patient is a 65 y.o. male with PMHx of anxiety/depression-presented to [facility] on 2/15 with shortness of breath-found to have large right-sided loculated pleural effusion and COVID-19 infection.  COVID-19 vaccinated status: Unvaccinated  Significant Events: 2/15>> Admit to [facility] for shortness of breath-found to have a large right-sided loculated pleural effusion and COVID-19 infection  2/16 >> transferred to [facility]   Significant studies: 2/15>> chest x-ray: Large right-sided pleural effusion 2/15>> CTA chest: Large/loculated right pleural effusion with extensive pulmonary collapse.  5.6 right adrenal mass 2/16>> Echo: EF 55-60%-no obvious vegetation seen on valves. 2/18>> CT chest without contrast: Effusion is significantly smaller compared to prior exam, dilated/fluid-filled esophagus 2/18>> barium esophagogram: Moderate esophageal dysmotility-no stricture/obstruction 2/18>> bilateral lower extremity Doppler: Negative for DVT.   COVID-19 medications: Steroids: 2/15>> 2/16 Remdesivir:  2/14>>2/17  Antibiotics: Vancomycin: 2/14>> 2/16 Cefepime: 2/14>>2/15 Ancef: 2/16>> 2/18 Unasyn: 2/18>>  Microbiology data: 2/15 >>blood culture: Staph hominis, staph epidermidis 2/15>> pleural fluid culture: No growth  2/16>> blood culture: No growth  Procedures: 2/15>> 2/19 chest tube (placed by PCCM)  Consults: CCM,cards, ID   Brief Hospital Course: Acute Hypoxic Resp Failure due to loculated large right-sided pleural effusion-likely empyema:  PCCM place chest tube-present broad-spectrum antimicrobial therapy-pleural fluid cultures negative so far.  Followed closely by ID/PCCM/cardiothoracic surgery.  Not felt to be a surgical candidate by CT surgery.  Chest tube was finally removed on 2/19.  Suspicion for aspiration given CT/barium esophagogram findings.  Discussed with infectious disease MD-Dr. Dickey Gave today-okay to discharge-needs for additional weeks of Augmentin.  He continues to complain of some pain at the chest tube site-we will continue narcotics for a few more days-we will provide him a prescription for a few days on discharge.  Coag negative staph bacteremia: Per ID-this could be a contaminant-repeat cultures negative so far.  Echo without vegetations   Paroxysmal AV block:  Monitor closely on telemetry-all AV nodal blocking agents were avoided.  Cardiology followed closely-EP was also consulted.  This was thought to be vagally mediated-since he is asymptomatic-apart from observation nothing further was recommended by cardiology/EP.    COVID-19 infection: Suspect mostly incidental finding-completed 3 days of Remdesivir.    Elevated D-dimer: Placed on prophylactic Lovenox-lower extremity Doppler was negative.  Dilated esophagus on CT: Barium esophagogram negative for obstruction-suspicion for motility disorder.  Will need outpatient EGD.  Will discharge on oral PPI for now.  Right adrenal mass: Chronic-benign based on 2010 abdominal ultrasound  Alcohol  abuse: Claims he quit 10 years back-watch  closely-no signs of withdrawal.  3 mm left apical nodule: Stable for outpatient follow-up  Depression/anxiety: Stable today-continue trazodone/Cymbalta and Atarax.    Constipation: Resolved-had BM following placement of laxative/suppository  Note-patient has been very disruptive-uncooperative with nursing staff-see detailed nursing note on 2/19.   Discharge Diagnoses:  Principal Problem:   Pneumonia due to COVID-19 virus Active Problems:   Respiratory failure with hypoxia (HCC)   Pleural effusion on right   Sepsis (HCC)   Leukocytosis   Normocytic anemia   Thrombocytosis   Pulmonary nodule   Alcohol abuse   Elevated brain natriuretic peptide (BNP) level   AV heart block   Discharge Instructions:    Person Under Monitoring Name: Mike Moran  Location: [address] [place] [address]   Infection Prevention Recommendations for Individuals Confirmed to have, or Being Evaluated for, 2019 Novel Coronavirus (COVID-19) Infection Who Receive Care at Home  Individuals who are confirmed to have, or are being evaluated for, COVID-19 should follow the prevention steps below until a healthcare provider or local or state health department says they can return to normal activities.  Stay home except to get medical care You should restrict activities outside your home, except for getting medical care. Do not go to work, school, or public areas, and do not use public transportation or taxis.  Call ahead before visiting your doctor Before your medical appointment, call the healthcare provider and tell them that you have, or are being evaluated for, COVID-19 infection. This will help the healthcare provider's office take steps to keep other people from getting infected. Ask your healthcare provider to call the local or state health department.  Monitor your symptoms Seek prompt medical attention if your illness is worsening  (e.g., difficulty breathing). Before going to your medical appointment, call the healthcare provider and tell them that you have, or are being evaluated for, COVID-19 infection. Ask your healthcare provider to call the local or state health department.  Wear a facemask You should wear a facemask that covers your nose and mouth when you are in the same room with other people and when you visit a healthcare provider. People who live with or visit you should also wear a facemask while they are in the same room with you.  Separate yourself from other people in your home As much as possible, you should stay in a different room from other people in your home. Also, you should use a separate bathroom, if available.  Avoid sharing household items You should not share dishes, drinking glasses, cups, eating utensils, towels, bedding, or other items with other people in your home. After using these items, you should wash them thoroughly with soap and water.  Cover your coughs and sneezes Cover your mouth and nose with a tissue when you cough or sneeze, or you can cough or sneeze into your sleeve. Throw used tissues in a lined trash can, and immediately wash your hands with soap and water for at least 20 seconds or use an alcohol-based hand rub.  Wash your [employer] your hands often and thoroughly with soap and water for at least 20 seconds. You can use an alcohol-based hand sanitizer if soap and water are not available and if your hands are not visibly dirty. Avoid touching your eyes, nose, and mouth with unwashed hands.   Prevention Steps for Caregivers and Household Members of Individuals Confirmed to have, or Being Evaluated for, COVID-19 Infection Being Cared for in the Home  If you live  with, or provide care at home for, a person confirmed to have, or being evaluated for, COVID-19 infection please follow these guidelines to prevent infection:  Follow healthcare provider's  instructions Make sure that you understand and can help the patient follow any healthcare provider instructions for all care.  Provide for the patient's basic needs You should help the patient with basic needs in the home and provide support for getting groceries, prescriptions, and other personal needs.  Monitor the patient's symptoms If they are getting sicker, call his or her medical provider and tell them that the patient has, or is being evaluated for, COVID-19 infection. This will help the healthcare provider's office take steps to keep other people from getting infected. Ask the healthcare provider to call the local or state health department.  Limit the number of people who have contact with the patient  If possible, have only one caregiver for the patient.  Other household members should stay in another home or place of residence. If this is not possible, they should stay  in another room, or be separated from the patient as much as possible. Use a separate bathroom, if available.  Restrict visitors who do not have an essential need to be in the home.  Keep older adults, very young children, and other sick people away from the patient Keep older adults, very young children, and those who have compromised immune systems or chronic health conditions away from the patient. This includes people with chronic heart, lung, or kidney conditions, diabetes, and cancer.  Ensure good ventilation Make sure that shared spaces in the home have good air flow, such as from an air conditioner or an opened window, weather permitting.  Wash your hands often  Wash your hands often and thoroughly with soap and water for at least 20 seconds. You can use an alcohol based hand sanitizer if soap and water are not available and if your hands are not visibly dirty.  Avoid touching your eyes, nose, and mouth with unwashed hands.  Use disposable paper towels to dry your hands. If not available, use  dedicated cloth towels and replace them when they become wet.  Wear a facemask and gloves  Wear a disposable facemask at all times in the room and gloves when you touch or have contact with the patient's blood, body fluids, and/or secretions or excretions, such as sweat, saliva, sputum, nasal mucus, vomit, urine, or feces.  Ensure the mask fits over your nose and mouth tightly, and do not touch it during use.  Throw out disposable facemasks and gloves after using them. Do not reuse.  Wash your hands immediately after removing your facemask and gloves.  If your personal clothing becomes contaminated, carefully remove clothing and launder. Wash your hands after handling contaminated clothing.  Place all used disposable facemasks, gloves, and other waste in a lined container before disposing them with other household waste.  Remove gloves and wash your hands immediately after handling these items.  Do not share dishes, glasses, or other household items with the patient  Avoid sharing household items. You should not share dishes, drinking glasses, cups, eating utensils, towels, bedding, or other items with a patient who is confirmed to have, or being evaluated for, COVID-19 infection.  After the person uses these items, you should wash them thoroughly with soap and water.  Wash laundry thoroughly  Immediately remove and wash clothes or bedding that have blood, body fluids, and/or secretions or excretions, such as sweat, saliva, sputum, nasal  mucus, vomit, urine, or feces, on them.  Wear gloves when handling laundry from the patient.  Read and follow directions on labels of laundry or clothing items and detergent. In general, wash and dry with the warmest temperatures recommended on the label.  Clean all areas the individual has used often  Clean all touchable surfaces, such as counters, tabletops, doorknobs, bathroom fixtures, toilets, phones, keyboards, tablets, and bedside tables, every  day. Also, clean any surfaces that may have blood, body fluids, and/or secretions or excretions on them.  Wear gloves when cleaning surfaces the patient has come in contact with.  Use a diluted bleach solution (e.g., dilute bleach with 1 part bleach and 10 parts water) or a household disinfectant with a label that says EPA-registered for coronaviruses. To make a bleach solution at home, add 1 tablespoon of bleach to 1 quart (4 cups) of water. For a larger supply, add  cup of bleach to 1 gallon (16 cups) of water.  Read labels of cleaning products and follow recommendations provided on product labels. Labels contain instructions for safe and effective use of the cleaning product including precautions you should take when applying the product, such as wearing gloves or eye protection and making sure you have good ventilation during use of the product.  Remove gloves and wash hands immediately after cleaning.  Monitor yourself for signs and symptoms of illness Caregivers and household members are considered close contacts, should monitor their health, and will be asked to limit movement outside of the home to the extent possible. Follow the monitoring steps for close contacts listed on the symptom monitoring form.   ? If you have additional questions, contact your local health department or call the epidemiologist on call at [phone] (available 24/7). ? This guidance is subject to change. For the most up-to-date guidance from [facility], please refer to their website: [email or web address]hu    Activity:  As tolerated   Discharge Instructions    Call MD for:  difficulty breathing, headache or visual disturbances   Complete by: As directed    Diet general   Complete by: As directed    Discharge instructions   Complete by: As directed    1.)  Please ask your primary care practitioner to refer you to a pulmonologist.  2.)  Please ask your  primary care practitioner to refer to your gastroenterologist-you had a dilated food pipe seen on imaging studies-you may require endoscopy.  3.)  Continue oral Augmentin (antibiotic) for total of 4 weeks.  4.)  You have a small nodule in your left lung-please ask your primary care practitioner to repeat CT imaging studies.  In some cases the small nodules can turn cancerous.   Follow with Primary MD  [name], MD in 1-2 weeks  Please get a complete blood count and chemistry panel checked by your Primary MD at your next visit, and again as instructed by your Primary MD.  Get Medicines reviewed and adjusted: Please take all your medications with you for your next visit with your Primary MD  Laboratory/radiological data: Please request your Primary MD to go over all hospital tests and procedure/radiological results at the follow up, please ask your Primary MD to get all Hospital records sent to his/her office.  In some cases, they will be blood work, cultures and biopsy results pending at the time of your discharge. Please request that your primary care M.D. follows up on these results.  Also Note the following: If you experience worsening of your  admission symptoms, develop shortness of breath, life threatening emergency, suicidal or homicidal thoughts you must seek medical attention immediately by calling 911 or calling your MD immediately  if symptoms less severe.  You must read complete instructions/literature along with all the possible adverse reactions/side effects for all the Medicines you take and that have been prescribed to you. Take any new Medicines after you have completely understood and accpet all the possible adverse reactions/side effects.   Do not drive when taking Pain medications or sleeping medications (Benzodaizepines)  Do not take more than prescribed Pain, Sleep and Anxiety Medications. It is not advisable to combine anxiety,sleep and pain medications without  talking with your primary care practitioner  Special Instructions: If you have smoked or chewed Tobacco  in the last 2 yrs please stop smoking, stop any regular Alcohol  and or any Recreational drug use.  Wear Seat belts while driving.  Please note: You were cared for by a hospitalist during your hospital stay. Once you are discharged, your primary care physician will handle any further medical issues. Please note that NO REFILLS for any discharge medications will be authorized once you are discharged, as it is imperative that you return to your primary care physician (or establish a relationship with a primary care physician if you do not have one) for your post hospital discharge needs so that they can reassess your need for medications and monitor your lab values.   Increase activity slowly   Complete by: As directed      Allergies as of 03/30/2020      Reactions   Codeine Nausea And Vomiting   Lexapro [escitalopram Oxalate] Other (See Comments)   lethargic      Medication List    STOP taking these medications   doxycycline 100 MG capsule Commonly known as: VIBRAMYCIN   triamcinolone 0.1 % Commonly known as: KENALOG     TAKE these medications   amoxicillin-clavulanate 875-125 MG tablet Commonly known as: Augmentin Take 1 tablet by mouth 2 (two) times daily for 28 days.   aspirin EC 81 MG tablet Take 1 tablet (81 mg total) by mouth daily. For heart health   DULoxetine 60 MG capsule Commonly known as: CYMBALTA Take 1 capsule (60 mg total) by mouth daily.   hydrOXYzine 25 MG tablet Commonly known as: ATARAX/VISTARIL Take 1 tablet (25 mg total) by mouth every 6 (six) hours as needed. for anxiety   multivitamin with minerals tablet Take 1 tablet by mouth daily.   oxyCODONE 5 MG immediate release tablet Commonly known as: Oxy IR/ROXICODONE Take 1 tablet (5 mg total) by mouth every 6 (six) hours as needed for moderate pain or severe pain (unrelieved by tramdol).    pantoprazole 40 MG tablet Commonly known as: Protonix Take 1 tablet (40 mg total) by mouth daily.   polyethylene glycol 17 g packet Commonly known as: MIRALAX / GLYCOLAX Take 17 g by mouth daily. Start taking on: March 31, 2020   risperiDONE 0.5 MG tablet Commonly known as: RISPERDAL TAKE (1) TABLET BY MOUTH AT BEDTIME.   traZODone 50 MG tablet Commonly known as: DESYREL Take 1 tablet (50 mg total) by mouth at bedtime as needed and may repeat dose one time if needed for sleep.       Allergies  Allergen Reactions  . Codeine Nausea And Vomiting  . Lexapro [Escitalopram Oxalate] Other (See Comments)    lethargic    Other Procedures/Studies: CT CHEST WO CONTRAST  Result Date: 03/28/2020 CLINICAL DATA:  Pleural effusion. EXAM: CT CHEST WITHOUT CONTRAST TECHNIQUE: Multidetector CT imaging of the chest was performed following the standard protocol without IV contrast. COMPARISON:  March 27, 2020.  March 25, 2020. FINDINGS: Cardiovascular: Atherosclerosis of thoracic aorta is noted without aneurysm formation. Normal cardiac size. No pericardial effusion. Mild coronary artery calcifications are noted. Mediastinum/Nodes: No adenopathy is noted. Thyroid gland is unremarkable. Dilated and fluid-filled esophagus is noted. There appears to be aspirated material within the trachea. Lungs/Pleura: No pneumothorax is noted. Interval placement of pigtail drainage catheter into right pleural effusion noted on prior exam. The effusion is significantly smaller compared to prior exam, a small residual effusion and adjacent atelectasis or infiltrate seen posteriorly in the right lower lobe. Mild left posterior basilar subsegmental atelectasis is noted. Upper Abdomen: Stable partially calcified right adrenal mass is noted. Musculoskeletal: No chest wall mass or suspicious bone lesions identified. IMPRESSION: 1. Interval placement of pigtail drainage catheter into right pleural effusion noted on prior  exam. The effusion is significantly smaller compared to prior exam, a small residual effusion and adjacent atelectasis or infiltrate seen posteriorly in the right lower lobe. 2. There appears to be aspirated material within the trachea. 3. Dilated and fluid-filled esophagus is noted. Distal obstruction cannot be excluded. 4. Stable partially calcified right adrenal mass is noted. 5. Mild coronary artery calcifications are noted. 6. Aortic atherosclerosis. Aortic Atherosclerosis (ICD10-I70.0). Electronically Signed   By: Lupita Raider M.D.   On: 03/28/2020 07:58   CT ANGIO CHEST PE W OR WO CONTRAST  Result Date: 03/25/2020 CLINICAL DATA:  Shortness of breath. EXAM: CT ANGIOGRAPHY CHEST WITH CONTRAST TECHNIQUE: Multidetector CT imaging of the chest was performed using the standard protocol during bolus administration of intravenous contrast. Multiplanar CT image reconstructions and MIPs were obtained to evaluate the vascular anatomy. CONTRAST:  OMNIPAQUE IOHEXOL 350 MG/ML SOLN COMPARISON:  None. FINDINGS: Cardiovascular: Satisfactory opacification of the pulmonary arteries to the segmental level. No evidence of pulmonary embolism. Normal heart size. No pericardial effusion. Atheromatous calcification of the aorta Mediastinum/Nodes: No visible adenopathy or mass. Lungs/Pleura: Large right pleural effusion with loculation causing scalloping of the lung. The apicomedial opacity by radiography is also loculated pleural fluid. No visible mass. Extensive atelectasis in the right lung with collapsed lower and middle lobes. Secretions/debris seen in the central right-sided airways. Mild left-sided atelectasis. Tiny left apical pulmonary nodule measuring 3 mm, which can be re-evaluated at follow-up. Upper Abdomen: Right adrenal mass which is partially covered at 5.6 cm with low-density appearance and coarse calcifications. A right suprarenal mass of similar size was described on an abdominal ultrasound April 21, 2008. Musculoskeletal: No bony erosion adjacent to the pleural fluid. Review of the MIP images confirms the above findings. IMPRESSION: 1. Large and loculated right pleural effusion with extensive right pulmonary collapse. No visible underlying tumor; empyema is the leading consideration. 2. Partially covered 5.6 cm right adrenal mass, chronic and benign based on a 2010 abdominal ultrasound. Electronically Signed   By: Marnee Spring M.D.   On: 03/25/2020 04:20   DG CHEST PORT 1 VIEW  Result Date: 03/27/2020 CLINICAL DATA:  Pleural effusion.  Chest tube. EXAM: PORTABLE CHEST 1 VIEW COMPARISON:  03/26/2020. FINDINGS: Right chest tube in stable position. No pneumothorax noted on today's exam. Stable right base and right upper medial loculated pleural effusions. Persistent bibasilar atelectasis. Heart size stable. IMPRESSION: Right chest tube in stable position. No pneumothorax noted on today's exam. Stable right base and right upper medial loculated pleural  effusions. Electronically Signed   By: Maisie Fushomas  Register   On: 03/27/2020 07:24   DG CHEST PORT 1 VIEW  Result Date: 03/26/2020 CLINICAL DATA:  Pleural effusion.  Chest tube. EXAM: PORTABLE CHEST 1 VIEW COMPARISON:  Chest x-ray 03/25/2020.  CT chest 03/25/2020. FINDINGS: Right chest tube in stable position. Tiny right apical pneumothorax again noted without interim change. Continued improvement in right base pleural effusion. Persistent right upper medial loculated pleural effusion again noted. Right base atelectasis/infiltrate. Heart size stable. IMPRESSION: 1. Right chest tube in stable position. Tiny right apical pneumothorax again noted without interim change. Continued improvement in right base pleural effusion. Persistent right upper medial loculated pleural effusion again noted. 2.  Right base atelectasis/infiltrate. Electronically Signed   By: Maisie Fushomas  Register   On: 03/26/2020 07:05   DG CHEST PORT 1 VIEW  Result Date: 03/25/2020 CLINICAL DATA:   Chest tube. EXAM: PORTABLE CHEST 1 VIEW COMPARISON:  CT 03/25/2020.  Chest x-ray 03/25/2020. FINDINGS: Interim placement right chest tube. Interim significant improvement right-sided pleural effusion with mild to moderate residual. Loculated pleural fluid component appears to be present over the upper medial chest. Right base atelectasis. Heart size stable. IMPRESSION: Interim placement of right chest tube. Interim significant improvement of right-sided pleural effusion with mild to moderate residual. Loculated pleural fluid component appears to be present over the upper medial chest. Associated very tiny right apical pneumothorax cannot be excluded on today's exam. Critical Value/emergent results were called by telephone at the time of interpretation on 03/25/2020 at 1:27 pm to nurse Lenda KelpLeanna, who verbally acknowledged these results. Electronically Signed   By: Maisie Fushomas  Register   On: 03/25/2020 13:28   DG Chest Port 1 View  Result Date: 03/25/2020 CLINICAL DATA:  Cough and shortness of breath EXAM: PORTABLE CHEST 1 VIEW COMPARISON:  01/09/2014 FINDINGS: Large right pleural effusion. Basilar atelectasis. Left lung is clear. Medial opacity at the right lung apex. IMPRESSION: 1. Large right pleural effusion and basilar atelectasis. 2. Medial opacity at the right lung apex. CT recommended for better characterization. Electronically Signed   By: Deatra RobinsonKevin  Herman M.D.   On: 03/25/2020 02:13   VAS US LOWER EXTREMITY VENOUS (DVT)  Result Date: 03/29/2020  Lower Venous DVT Study Indications: Elevated Ddimer.  Risk Factors: COVID 19 positive. Limitations: Body habitus and poor ultrasound/tissue interface. Comparison Study: No prior studies. Performing Technologist: Chanda BusingGregory Collins RVT  Examination Guidelines: A complete evaluation includes B-mode imaging, spectral Doppler, color Doppler, and power Doppler as needed of all accessible portions of each vessel. Bilateral testing is considered an integral part of a complete  examination. Limited examinations for reoccurring indications may be performed as noted. The reflux portion of the exam is performed with the patient in reverse Trendelenburg.  +---------+---------------+---------+-----------+----------+--------------+ RIGHT    CompressibilityPhasicitySpontaneityPropertiesThrombus Aging +---------+---------------+---------+-----------+----------+--------------+ CFV      Full           Yes      Yes                                 +---------+---------------+---------+-----------+----------+--------------+ SFJ      Full                                                        +---------+---------------+---------+-----------+----------+--------------+ FV Prox  Full                                                        +---------+---------------+---------+-----------+----------+--------------+ FV Mid   Full                                                        +---------+---------------+---------+-----------+----------+--------------+ FV DistalFull                                                        +---------+---------------+---------+-----------+----------+--------------+ PFV      Full                                                        +---------+---------------+---------+-----------+----------+--------------+ POP      Full           Yes      Yes                                 +---------+---------------+---------+-----------+----------+--------------+ PTV      Full                                                        +---------+---------------+---------+-----------+----------+--------------+ PERO     Full                                                        +---------+---------------+---------+-----------+----------+--------------+   +---------+---------------+---------+-----------+----------+--------------+ LEFT     CompressibilityPhasicitySpontaneityPropertiesThrombus Aging  +---------+---------------+---------+-----------+----------+--------------+ CFV      Full           Yes      Yes                                 +---------+---------------+---------+-----------+----------+--------------+ SFJ      Full                                                        +---------+---------------+---------+-----------+----------+--------------+ FV Prox  Full                                                        +---------+---------------+---------+-----------+----------+--------------+  FV Mid   Full                                                        +---------+---------------+---------+-----------+----------+--------------+ FV DistalFull                                                        +---------+---------------+---------+-----------+----------+--------------+ PFV      Full                                                        +---------+---------------+---------+-----------+----------+--------------+ POP      Full           Yes      Yes                                 +---------+---------------+---------+-----------+----------+--------------+ PTV      Full                                                        +---------+---------------+---------+-----------+----------+--------------+ PERO     Full                                                        +---------+---------------+---------+-----------+----------+--------------+     Summary: RIGHT: - There is no evidence of deep vein thrombosis in the lower extremity.  - No cystic structure found in the popliteal fossa.  LEFT: - There is no evidence of deep vein thrombosis in the lower extremity.  - No cystic structure found in the popliteal fossa.  *See table(s) above for measurements and observations. Electronically signed by Heath Lark on 03/29/2020 at 10:58:15 AM.    Final    ECHOCARDIOGRAM LIMITED  Result Date: 03/26/2020    ECHOCARDIOGRAM LIMITED REPORT   Patient  Name:   Mike Moran Date of Exam: 03/26/2020 Medical Rec #:  [id number]      Height:       72.0 in Accession #:    [id number]     Weight:       130.3 lb Date of Birth:  1955-08-14      BSA:          1.776 m Patient Age:    64 years       BP:           92/64 mmHg Patient Gender: M              HR:           73 bpm. Exam Location:  Inpatient Procedure: Limited Echo, Cardiac Doppler and Color Doppler Indications:  Bradycardia  History:        Patient has no prior history of Echocardiogram examinations.                 Arrythmias:Bradycardia; Signs/Symptoms:Shortness of Breath.                 Alcohol abuse, Loculated Right pleural effusion, chest tube                 present, COVID+.  Sonographer:    Lavenia Atlas Referring Phys: [id number] Ronnald Ramp O'NEAL IMPRESSIONS  1. Left ventricular ejection fraction, by estimation, is 55 to 60%. The left ventricle has normal function. The left ventricle has no regional wall motion abnormalities. There is mild left ventricular hypertrophy. Left ventricular diastolic parameters were normal.  2. Right ventricular systolic function is normal. The right ventricular size is mildly enlarged. Tricuspid regurgitation signal is inadequate for assessing PA pressure.  3. A small pericardial effusion is present.  4. The mitral valve is normal in structure. Trivial mitral valve regurgitation.  5. The aortic valve is tricuspid. Aortic valve regurgitation is not visualized. No aortic stenosis is present.  6. The inferior vena cava is normal in size with <50% respiratory variability, suggesting right atrial pressure of 8 mmHg. FINDINGS  Left Ventricle: Left ventricular ejection fraction, by estimation, is 55 to 60%. The left ventricle has normal function. The left ventricle has no regional wall motion abnormalities. The left ventricular internal cavity size was small. There is mild left ventricular hypertrophy. Left ventricular diastolic parameters were normal. Right Ventricle: The  right ventricular size is mildly enlarged. Right ventricular systolic function is normal. Tricuspid regurgitation signal is inadequate for assessing PA pressure. Pericardium: A small pericardial effusion is present. Mitral Valve: The mitral valve is normal in structure. Trivial mitral valve regurgitation. Tricuspid Valve: The tricuspid valve is normal in structure. Tricuspid valve regurgitation is trivial. Aortic Valve: The aortic valve is tricuspid. Aortic valve regurgitation is not visualized. No aortic stenosis is present. Aorta: The aortic root is normal in size and structure. Venous: The inferior vena cava is normal in size with less than 50% respiratory variability, suggesting right atrial pressure of 8 mmHg. LEFT VENTRICLE PLAX 2D LVIDd:         3.60 cm  Diastology LVIDs:         2.70 cm  LV e' medial:    7.94 cm/s LV PW:         0.90 cm  LV E/e' medial:  8.8 LV IVS:        1.00 cm  LV e' lateral:   8.27 cm/s LVOT diam:     1.90 cm  LV E/e' lateral: 8.4 LVOT Area:     2.84 cm  RIGHT VENTRICLE RV S prime:     4.03 cm/s LEFT ATRIUM         Index LA diam:    2.60 cm 1.46 cm/m   AORTA Ao Root diam: 3.10 cm MITRAL VALVE MV Area (PHT): 4.89 cm    SHUNTS MV Decel Time: 155 msec    Systemic Diam: 1.90 cm MV E velocity: 69.80 cm/s MV A velocity: 56.10 cm/s MV E/A ratio:  1.24 Epifanio Lesches MD Electronically signed by Epifanio Lesches MD Signature Date/Time: 03/26/2020/11:38:24 PM    Final    DG ESOPHAGUS W SINGLE CM (SOL OR THIN BA)  Result Date: 03/28/2020 CLINICAL DATA:  Dilated esophagus recent CT. Complicated right pleural effusion requiring chest tube placement. COVID-19 infection. EXAM: ESOPHOGRAM/BARIUM  SWALLOW TECHNIQUE: Single contrast examination was performed using water-soluble contrast. FLUOROSCOPY TIME:  Fluoroscopy Time: 1 minutes and 0 seconds of low-dose pulsed fluoroscopy Radiation Exposure Index (if provided by the fluoroscopic device): 3.5 mGy Number of Acquired Spot Images: 1  COMPARISON:  Chest CT 03/28/2020 and 03/25/2020 FINDINGS: Full COVID precautions were utilized. The study was performed with the patient in the supine and semi erect positions. The patient was only able to swallow small to moderate boluses of contrast. No tracheobronchial aspiration was observed. There is moderate esophageal dysmotility with a decreased primary stripping wave and increased tertiary contractions. No focal mucosal ulceration or stricture identified. Compared with the earlier CT, the esophagus is no longer distended, and no significant fluid is present within the esophageal lumen. There is no evidence of esophageal obstruction or perforation. A barium tablet was not administered. IMPRESSION: Moderate esophageal dysmotility. No evidence of esophageal dilatation, stricture, obstruction or perforation. Endoscopic evaluation may be warranted after COVID restrictions have expired if the patient has persistent unexplained symptoms. Electronically Signed   By: Carey Bullocks M.D.   On: 03/28/2020 15:37     TODAY-DAY OF DISCHARGE:  Subjective:   Mike Moran today has no headache,no chest abdominal pain,no new weakness tingling or numbness, feels much better wants to go home today.   Objective:   Blood pressure (!) 90/53, pulse 70, temperature 98.2 F (36.8 C), temperature source Axillary, resp. rate 16, height 6' (1.829 m), weight 58.9 kg, SpO2 98 %.  Intake/Output Summary (Last 24 hours) at 03/30/2020 1112 Last data filed at 03/29/2020 1350 Gross per 24 hour  Intake 200 ml  Output -  Net 200 ml   Filed Weights   03/26/20 0500 03/27/20 0452 03/30/20 0446  Weight: 59.1 kg 58.8 kg 58.9 kg    Exam: Awake Alert, Oriented *3, No new F.N deficits, Normal affect [place].AT,PERRAL Supple Neck,No JVD, No cervical lymphadenopathy appriciated.  Symmetrical Chest wall movement, Good air movement bilaterally, CTAB RRR,No Gallops,Rubs or new Murmurs, No Parasternal Heave +ve B.Sounds, Abd Soft,  Non tender, No organomegaly appriciated, No rebound -guarding or rigidity. No Cyanosis, Clubbing or edema, No new Rash or bruise   PERTINENT RADIOLOGIC STUDIES: CT CHEST WO CONTRAST  Result Date: 03/28/2020 CLINICAL DATA:  Pleural effusion. EXAM: CT CHEST WITHOUT CONTRAST TECHNIQUE: Multidetector CT imaging of the chest was performed following the standard protocol without IV contrast. COMPARISON:  March 27, 2020.  March 25, 2020. FINDINGS: Cardiovascular: Atherosclerosis of thoracic aorta is noted without aneurysm formation. Normal cardiac size. No pericardial effusion. Mild coronary artery calcifications are noted. Mediastinum/Nodes: No adenopathy is noted. Thyroid gland is unremarkable. Dilated and fluid-filled esophagus is noted. There appears to be aspirated material within the trachea. Lungs/Pleura: No pneumothorax is noted. Interval placement of pigtail drainage catheter into right pleural effusion noted on prior exam. The effusion is significantly smaller compared to prior exam, a small residual effusion and adjacent atelectasis or infiltrate seen posteriorly in the right lower lobe. Mild left posterior basilar subsegmental atelectasis is noted. Upper Abdomen: Stable partially calcified right adrenal mass is noted. Musculoskeletal: No chest wall mass or suspicious bone lesions identified. IMPRESSION: 1. Interval placement of pigtail drainage catheter into right pleural effusion noted on prior exam. The effusion is significantly smaller compared to prior exam, a small residual effusion and adjacent atelectasis or infiltrate seen posteriorly in the right lower lobe. 2. There appears to be aspirated material within the trachea. 3. Dilated and fluid-filled esophagus is noted. Distal obstruction cannot be excluded. 4.  Stable partially calcified right adrenal mass is noted. 5. Mild coronary artery calcifications are noted. 6. Aortic atherosclerosis. Aortic Atherosclerosis (ICD10-I70.0). Electronically  Signed   By: Lupita Raider M.D.   On: 03/28/2020 07:58   CT ANGIO CHEST PE W OR WO CONTRAST  Result Date: 03/25/2020 CLINICAL DATA:  Shortness of breath. EXAM: CT ANGIOGRAPHY CHEST WITH CONTRAST TECHNIQUE: Multidetector CT imaging of the chest was performed using the standard protocol during bolus administration of intravenous contrast. Multiplanar CT image reconstructions and MIPs were obtained to evaluate the vascular anatomy. CONTRAST:  OMNIPAQUE IOHEXOL 350 MG/ML SOLN COMPARISON:  None. FINDINGS: Cardiovascular: Satisfactory opacification of the pulmonary arteries to the segmental level. No evidence of pulmonary embolism. Normal heart size. No pericardial effusion. Atheromatous calcification of the aorta Mediastinum/Nodes: No visible adenopathy or mass. Lungs/Pleura: Large right pleural effusion with loculation causing scalloping of the lung. The apicomedial opacity by radiography is also loculated pleural fluid. No visible mass. Extensive atelectasis in the right lung with collapsed lower and middle lobes. Secretions/debris seen in the central right-sided airways. Mild left-sided atelectasis. Tiny left apical pulmonary nodule measuring 3 mm, which can be re-evaluated at follow-up. Upper Abdomen: Right adrenal mass which is partially covered at 5.6 cm with low-density appearance and coarse calcifications. A right suprarenal mass of similar size was described on an abdominal ultrasound April 21, 2008. Musculoskeletal: No bony erosion adjacent to the pleural fluid. Review of the MIP images confirms the above findings. IMPRESSION: 1. Large and loculated right pleural effusion with extensive right pulmonary collapse. No visible underlying tumor; empyema is the leading consideration. 2. Partially covered 5.6 cm right adrenal mass, chronic and benign based on a 2010 abdominal ultrasound. Electronically Signed   By: Marnee Spring M.D.   On: 03/25/2020 04:20   DG CHEST PORT 1 VIEW  Result Date:  03/27/2020 CLINICAL DATA:  Pleural effusion.  Chest tube. EXAM: PORTABLE CHEST 1 VIEW COMPARISON:  03/26/2020. FINDINGS: Right chest tube in stable position. No pneumothorax noted on today's exam. Stable right base and right upper medial loculated pleural effusions. Persistent bibasilar atelectasis. Heart size stable. IMPRESSION: Right chest tube in stable position. No pneumothorax noted on today's exam. Stable right base and right upper medial loculated pleural effusions. Electronically Signed   By: Maisie Fus  Register   On: 03/27/2020 07:24   DG CHEST PORT 1 VIEW  Result Date: 03/26/2020 CLINICAL DATA:  Pleural effusion.  Chest tube. EXAM: PORTABLE CHEST 1 VIEW COMPARISON:  Chest x-ray 03/25/2020.  CT chest 03/25/2020. FINDINGS: Right chest tube in stable position. Tiny right apical pneumothorax again noted without interim change. Continued improvement in right base pleural effusion. Persistent right upper medial loculated pleural effusion again noted. Right base atelectasis/infiltrate. Heart size stable. IMPRESSION: 1. Right chest tube in stable position. Tiny right apical pneumothorax again noted without interim change. Continued improvement in right base pleural effusion. Persistent right upper medial loculated pleural effusion again noted. 2.  Right base atelectasis/infiltrate. Electronically Signed   By: Maisie Fus  Register   On: 03/26/2020 07:05   DG CHEST PORT 1 VIEW  Result Date: 03/25/2020 CLINICAL DATA:  Chest tube. EXAM: PORTABLE CHEST 1 VIEW COMPARISON:  CT 03/25/2020.  Chest x-ray 03/25/2020. FINDINGS: Interim placement right chest tube. Interim significant improvement right-sided pleural effusion with mild to moderate residual. Loculated pleural fluid component appears to be present over the upper medial chest. Right base atelectasis. Heart size stable. IMPRESSION: Interim placement of right chest tube. Interim significant improvement of right-sided pleural effusion  with mild to moderate residual.  Loculated pleural fluid component appears to be present over the upper medial chest. Associated very tiny right apical pneumothorax cannot be excluded on today's exam. Critical Value/emergent results were called by telephone at the time of interpretation on 03/25/2020 at 1:27 pm to nurse Lenda Kelp, who verbally acknowledged these results. Electronically Signed   By: Maisie Fus  Register   On: 03/25/2020 13:28   DG Chest Port 1 View  Result Date: 03/25/2020 CLINICAL DATA:  Cough and shortness of breath EXAM: PORTABLE CHEST 1 VIEW COMPARISON:  01/09/2014 FINDINGS: Large right pleural effusion. Basilar atelectasis. Left lung is clear. Medial opacity at the right lung apex. IMPRESSION: 1. Large right pleural effusion and basilar atelectasis. 2. Medial opacity at the right lung apex. CT recommended for better characterization. Electronically Signed   By: Deatra Robinson M.D.   On: 03/25/2020 02:13   VAS [place] LOWER EXTREMITY VENOUS (DVT)  Result Date: 03/29/2020  Lower Venous DVT Study Indications: Elevated Ddimer.  Risk Factors: COVID 19 positive. Limitations: Body habitus and poor ultrasound/tissue interface. Comparison Study: No prior studies. Performing Technologist: Chanda Busing RVT  Examination Guidelines: A complete evaluation includes B-mode imaging, spectral Doppler, color Doppler, and power Doppler as needed of all accessible portions of each vessel. Bilateral testing is considered an integral part of a complete examination. Limited examinations for reoccurring indications may be performed as noted. The reflux portion of the exam is performed with the patient in reverse Trendelenburg.  +---------+---------------+---------+-----------+----------+--------------+ RIGHT    CompressibilityPhasicitySpontaneityPropertiesThrombus Aging +---------+---------------+---------+-----------+----------+--------------+ CFV      Full           Yes      Yes                                  +---------+---------------+---------+-----------+----------+--------------+ SFJ      Full                                                        +---------+---------------+---------+-----------+----------+--------------+ FV Prox  Full                                                        +---------+---------------+---------+-----------+----------+--------------+ FV Mid   Full                                                        +---------+---------------+---------+-----------+----------+--------------+ FV DistalFull                                                        +---------+---------------+---------+-----------+----------+--------------+ PFV      Full                                                        +---------+---------------+---------+-----------+----------+--------------+  POP      Full           Yes      Yes                                 +---------+---------------+---------+-----------+----------+--------------+ PTV      Full                                                        +---------+---------------+---------+-----------+----------+--------------+ PERO     Full                                                        +---------+---------------+---------+-----------+----------+--------------+   +---------+---------------+---------+-----------+----------+--------------+ LEFT     CompressibilityPhasicitySpontaneityPropertiesThrombus Aging +---------+---------------+---------+-----------+----------+--------------+ CFV      Full           Yes      Yes                                 +---------+---------------+---------+-----------+----------+--------------+ SFJ      Full                                                        +---------+---------------+---------+-----------+----------+--------------+ FV Prox  Full                                                         +---------+---------------+---------+-----------+----------+--------------+ FV Mid   Full                                                        +---------+---------------+---------+-----------+----------+--------------+ FV DistalFull                                                        +---------+---------------+---------+-----------+----------+--------------+ PFV      Full                                                        +---------+---------------+---------+-----------+----------+--------------+ POP      Full           Yes      Yes                                 +---------+---------------+---------+-----------+----------+--------------+  PTV      Full                                                        +---------+---------------+---------+-----------+----------+--------------+ PERO     Full                                                        +---------+---------------+---------+-----------+----------+--------------+     Summary: RIGHT: - There is no evidence of deep vein thrombosis in the lower extremity.  - No cystic structure found in the popliteal fossa.  LEFT: - There is no evidence of deep vein thrombosis in the lower extremity.  - No cystic structure found in the popliteal fossa.  *See table(s) above for measurements and observations. Electronically signed by Heath Lark on 03/29/2020 at 10:58:15 AM.    Final    ECHOCARDIOGRAM LIMITED  Result Date: 03/26/2020    ECHOCARDIOGRAM LIMITED REPORT   Patient Name:   Mike Moran Date of Exam: 03/26/2020 Medical Rec #:  [id number]      Height:       72.0 in Accession #:    [id number]     Weight:       130.3 lb Date of Birth:  [birth year]      BSA:          1.776 m Patient Age:    64 years       BP:           92/64 mmHg Patient Gender: M              HR:           73 bpm. Exam Location:  Inpatient Procedure: Limited Echo, Cardiac Doppler and Color Doppler Indications:    Bradycardia  History:        Patient  has no prior history of Echocardiogram examinations.                 Arrythmias:Bradycardia; Signs/Symptoms:Shortness of Breath.                 Alcohol abuse, Loculated Right pleural effusion, chest tube                 present, COVID+.  Sonographer:    Lavenia Atlas Referring Phys: [id number] Ronnald Ramp O'NEAL IMPRESSIONS  1. Left ventricular ejection fraction, by estimation, is 55 to 60%. The left ventricle has normal function. The left ventricle has no regional wall motion abnormalities. There is mild left ventricular hypertrophy. Left ventricular diastolic parameters were normal.  2. Right ventricular systolic function is normal. The right ventricular size is mildly enlarged. Tricuspid regurgitation signal is inadequate for assessing PA pressure.  3. A small pericardial effusion is present.  4. The mitral valve is normal in structure. Trivial mitral valve regurgitation.  5. The aortic valve is tricuspid. Aortic valve regurgitation is not visualized. No aortic stenosis is present.  6. The inferior vena cava is normal in size with <50% respiratory variability, suggesting right atrial pressure of 8 mmHg. FINDINGS  Left Ventricle: Left ventricular ejection fraction, by estimation, is 55 to 60%. The left ventricle has normal function. The left  ventricle has no regional wall motion abnormalities. The left ventricular internal cavity size was small. There is mild left ventricular hypertrophy. Left ventricular diastolic parameters were normal. Right Ventricle: The right ventricular size is mildly enlarged. Right ventricular systolic function is normal. Tricuspid regurgitation signal is inadequate for assessing PA pressure. Pericardium: A small pericardial effusion is present. Mitral Valve: The mitral valve is normal in structure. Trivial mitral valve regurgitation. Tricuspid Valve: The tricuspid valve is normal in structure. Tricuspid valve regurgitation is trivial. Aortic Valve: The aortic valve is tricuspid.  Aortic valve regurgitation is not visualized. No aortic stenosis is present. Aorta: The aortic root is normal in size and structure. Venous: The inferior vena cava is normal in size with less than 50% respiratory variability, suggesting right atrial pressure of 8 mmHg. LEFT VENTRICLE PLAX 2D LVIDd:         3.60 cm  Diastology LVIDs:         2.70 cm  LV e' medial:    7.94 cm/s LV PW:         0.90 cm  LV E/e' medial:  8.8 LV IVS:        1.00 cm  LV e' lateral:   8.27 cm/s LVOT diam:     1.90 cm  LV E/e' lateral: 8.4 LVOT Area:     2.84 cm  RIGHT VENTRICLE RV S prime:     4.03 cm/s LEFT ATRIUM         Index LA diam:    2.60 cm 1.46 cm/m   AORTA Ao Root diam: 3.10 cm MITRAL VALVE MV Area (PHT): 4.89 cm    SHUNTS MV Decel Time: 155 msec    Systemic Diam: 1.90 cm MV E velocity: 69.80 cm/s MV A velocity: 56.10 cm/s MV E/A ratio:  1.24 Epifanio Lesches MD Electronically signed by Epifanio Lesches MD Signature Date/Time: 03/26/2020/11:38:24 PM    Final    DG ESOPHAGUS W SINGLE CM (SOL OR THIN BA)  Result Date: 03/28/2020 CLINICAL DATA:  Dilated esophagus recent CT. Complicated right pleural effusion requiring chest tube placement. COVID-19 infection. EXAM: ESOPHOGRAM/BARIUM SWALLOW TECHNIQUE: Single contrast examination was performed using water-soluble contrast. FLUOROSCOPY TIME:  Fluoroscopy Time: 1 minutes and 0 seconds of low-dose pulsed fluoroscopy Radiation Exposure Index (if provided by the fluoroscopic device): 3.5 mGy Number of Acquired Spot Images: 1 COMPARISON:  Chest CT 03/28/2020 and 03/25/2020 FINDINGS: Full COVID precautions were utilized. The study was performed with the patient in the supine and semi erect positions. The patient was only able to swallow small to moderate boluses of contrast. No tracheobronchial aspiration was observed. There is moderate esophageal dysmotility with a decreased primary stripping wave and increased tertiary contractions. No focal mucosal ulceration or stricture  identified. Compared with the earlier CT, the esophagus is no longer distended, and no significant fluid is present within the esophageal lumen. There is no evidence of esophageal obstruction or perforation. A barium tablet was not administered. IMPRESSION: Moderate esophageal dysmotility. No evidence of esophageal dilatation, stricture, obstruction or perforation. Endoscopic evaluation may be warranted after COVID restrictions have expired if the patient has persistent unexplained symptoms. Electronically Signed   By: Carey Bullocks M.D.   On: 03/28/2020 15:37     PERTINENT LAB RESULTS: CBC: Recent Labs    03/29/20 0329 03/30/20 0151  WBC 14.4* 11.8*  HGB 11.2* 10.1*  HCT 33.9* 30.8*  PLT 490* 489*   CMET CMP     Component Value Date/Time   NA 135 03/30/2020 0151  K 3.5 03/30/2020 0151   CL 97 (L) 03/30/2020 0151   CO2 29 03/30/2020 0151   GLUCOSE 126 (H) 03/30/2020 0151   BUN 18 03/30/2020 0151   CREATININE 0.56 (L) 03/30/2020 0151   CALCIUM 8.2 (L) 03/30/2020 0151   PROT 4.9 (L) 03/30/2020 0151   ALBUMIN 2.1 (L) 03/30/2020 0151   AST 15 03/30/2020 0151   ALT 19 03/30/2020 0151   ALKPHOS 37 (L) 03/30/2020 0151   BILITOT 0.7 03/30/2020 0151   GFRNONAA >60 03/30/2020 0151   GFRAA >60 07/29/2015 0857    GFR Estimated Creatinine Clearance: 75.7 mL/min (A) (by C-G formula based on SCr of 0.56 mg/dL (L)). No results for input(s): LIPASE, AMYLASE in the last 72 hours. No results for input(s): CKTOTAL, CKMB, CKMBINDEX, TROPONINI in the last 72 hours. Invalid input(s): POCBNP Recent Labs    03/29/20 0329 03/30/20 0151  DDIMER 2.49* 2.12*   No results for input(s): HGBA1C in the last 72 hours. No results for input(s): CHOL, HDL, LDLCALC, TRIG, CHOLHDL, LDLDIRECT in the last 72 hours. No results for input(s): TSH, T4TOTAL, T3FREE, THYROIDAB in the last 72 hours.  Invalid input(s): FREET3 Recent Labs    03/29/20 0329 03/30/20 0151  FERRITIN 361* 338*   Coags: No  results for input(s): INR in the last 72 hours.  Invalid input(s): PT Microbiology: Recent Results (from the past 240 hour(s))  Culture, blood (Routine X 2) w Reflex to ID Panel     Status: Abnormal   Collection Time: 03/25/20  2:05 AM   Specimen: Right Antecubital; Blood  Result Value Ref Range Status   Specimen Description   Final    RIGHT ANTECUBITAL Performed at [facility], [address] [place], [place] [address]    Special Requests   Final    BAA Blood Culture adequate volume Performed at [facility], [address] [place], [place] [address]    Culture  Setup Time   Final    GRAM POSITIVE COCCI IN BOTH AEROBIC AND ANAEROBIC BOTTLES Gram Stain Report Called to,Read Back By and Verified With: KNECHT,LINDSAY @ [facility] 5 W @2327  BY MATTHEWS, B 2.15.22    Culture (A)  Final    STAPHYLOCOCCUS HOMINIS STAPHYLOCOCCUS EPIDERMIDIS SUSCEPTIBILITIES PERFORMED ON PREVIOUS CULTURE WITHIN THE LAST 5 DAYS. Performed at [facility], [address] [place], [place] [address]    Report Status 03/29/2020 FINAL  Final  Resp Panel by RT-PCR (Flu A&B, Covid) Nasopharyngeal Swab     Status: Abnormal   Collection Time: 03/25/20  2:10 AM   Specimen: Nasopharyngeal Swab; Nasopharyngeal(NP) swabs in vial transport medium  Result Value Ref Range Status   SARS Coronavirus 2 by RT PCR POSITIVE (A) NEGATIVE Final    Comment: RESULT CALLED TO, READ BACK BY AND VERIFIED WITH: WALKER,T@0326  BY MATTHEWS,B 2.15.22 (NOTE) SARS-CoV-2 target nucleic acids are DETECTED.  The SARS-CoV-2 RNA is generally detectable in upper respiratory specimens during the acute phase of infection. Positive results are indicative of the presence of the identified virus, but do not rule out bacterial infection or co-infection with other pathogens not detected by the test. Clinical correlation with patient history and other diagnostic information is necessary to determine patient infection status. The expected result  is Negative.  Fact Sheet for Patients: BloggerCourse.com  Fact Sheet for Healthcare Providers: SeriousBroker.it  This test is not yet approved or cleared by the [place] FDA and  has been authorized for detection and/or diagnosis of SARS-CoV-2 by FDA under an Emergency  Use Authorization (EUA).  This EUA will remain in effect (meaning this test can  be used) for the duration of  the COVID-19 declaration under Section 564(b)(1) of the Act, 21 U.S.C. section 360bbb-3(b)(1), unless the authorization is terminated or revoked sooner.     Influenza A by PCR NEGATIVE NEGATIVE Final   Influenza B by PCR NEGATIVE NEGATIVE Final    Comment: (NOTE) The Xpert Xpress SARS-CoV-2/FLU/RSV plus assay is intended as an aid in the diagnosis of influenza from Nasopharyngeal swab specimens and should not be used as a sole basis for treatment. Nasal washings and aspirates are unacceptable for Xpert Xpress SARS-CoV-2/FLU/RSV testing.  Fact Sheet for Patients: BloggerCourse.com  Fact Sheet for Healthcare Providers: SeriousBroker.it  This test is not yet approved or cleared by the [place] FDA and has been authorized for detection and/or diagnosis of SARS-CoV-2 by FDA under an Emergency Use Authorization (EUA). This EUA will remain in effect (meaning this test can be used) for the duration of the COVID-19 declaration under Section 564(b)(1) of the Act, 21 U.S.C. section 360bbb-3(b)(1), unless the authorization is terminated or revoked.  Performed at [facility], [address] [place], [place] [address]   Culture, blood (Routine X 2) w Reflex to ID Panel     Status: Abnormal   Collection Time: 03/25/20  2:45 AM   Specimen: Right Antecubital; Blood  Result Value Ref Range Status   Specimen Description   Final    RIGHT ANTECUBITAL Performed at [facility], [address], [place],  [place] [address]    Special Requests   Final    BAA Blood Culture adequate volume Performed at [facility], [address], [place], [place] [address]    Culture  Setup Time   Final    GRAM POSITIVE COCCI IN BOTH AEROBIC AND ANAEROBIC BOTTLES Gram Stain Report Called to,Read Back By and Verified With: [email or web address] 5w@2109  by matthews,b 2.15.22 Organism ID to follow CRITICAL RESULT CALLED TO, READ BACK BY AND VERIFIED WITH: Melven [facility] 03/26/20 0451 JDW Performed at [facility], [address] [place], [place] [address]    Culture (A)  Final    STAPHYLOCOCCUS HOMINIS STAPHYLOCOCCUS EPIDERMIDIS    Report Status 03/29/2020 FINAL  Final   Organism ID, Bacteria STAPHYLOCOCCUS HOMINIS  Final   Organism ID, Bacteria STAPHYLOCOCCUS EPIDERMIDIS  Final      Susceptibility   Staphylococcus epidermidis - MIC*    CIPROFLOXACIN <=0.5 SENSITIVE Sensitive     ERYTHROMYCIN <=0.25 SENSITIVE Sensitive     GENTAMICIN <=0.5 SENSITIVE Sensitive     OXACILLIN <=0.25 SENSITIVE Sensitive     TETRACYCLINE <=1 SENSITIVE Sensitive     VANCOMYCIN 1 SENSITIVE Sensitive     TRIMETH/SULFA <=10 SENSITIVE Sensitive     CLINDAMYCIN <=0.25 SENSITIVE Sensitive     RIFAMPIN <=0.5 SENSITIVE Sensitive     Inducible Clindamycin NEGATIVE Sensitive     * STAPHYLOCOCCUS EPIDERMIDIS   Staphylococcus hominis - MIC*    CIPROFLOXACIN <=0.5 SENSITIVE Sensitive     ERYTHROMYCIN >=8 RESISTANT Resistant     GENTAMICIN <=0.5 SENSITIVE Sensitive     OXACILLIN <=0.25 SENSITIVE Sensitive     TETRACYCLINE 2 SENSITIVE Sensitive     VANCOMYCIN 1 SENSITIVE Sensitive     TRIMETH/SULFA <=10 SENSITIVE Sensitive     CLINDAMYCIN <=0.25 SENSITIVE Sensitive     RIFAMPIN <=0.5 SENSITIVE Sensitive     Inducible Clindamycin NEGATIVE Sensitive     * STAPHYLOCOCCUS HOMINIS  Blood Culture ID Panel (Reflexed)  Status: Abnormal   Collection Time: 03/25/20  2:45 AM  Result Value Ref Range Status   Enterococcus faecalis  NOT DETECTED NOT DETECTED Final   Enterococcus Faecium NOT DETECTED NOT DETECTED Final   Listeria monocytogenes NOT DETECTED NOT DETECTED Final   Staphylococcus species DETECTED (A) NOT DETECTED Final    Comment: CRITICAL RESULT CALLED TO, READ BACK BY AND VERIFIED WITH: J LEDFORD PHARMD 03/26/20 0451 JDW    Staphylococcus aureus (BCID) NOT DETECTED NOT DETECTED Final   Staphylococcus epidermidis DETECTED (A) NOT DETECTED Final    Comment: CRITICAL RESULT CALLED TO, READ BACK BY AND VERIFIED WITH: J LEDFORD PHARMD 03/26/20 0451 JDW    Staphylococcus lugdunensis NOT DETECTED NOT DETECTED Final   Streptococcus species NOT DETECTED NOT DETECTED Final   Streptococcus agalactiae NOT DETECTED NOT DETECTED Final   Streptococcus pneumoniae NOT DETECTED NOT DETECTED Final   Streptococcus pyogenes NOT DETECTED NOT DETECTED Final   A.calcoaceticus-baumannii NOT DETECTED NOT DETECTED Final   Bacteroides fragilis NOT DETECTED NOT DETECTED Final   Enterobacterales NOT DETECTED NOT DETECTED Final   Enterobacter cloacae complex NOT DETECTED NOT DETECTED Final   Escherichia coli NOT DETECTED NOT DETECTED Final   Klebsiella aerogenes NOT DETECTED NOT DETECTED Final   Klebsiella oxytoca NOT DETECTED NOT DETECTED Final   Klebsiella pneumoniae NOT DETECTED NOT DETECTED Final   Proteus species NOT DETECTED NOT DETECTED Final   Salmonella species NOT DETECTED NOT DETECTED Final   Serratia marcescens NOT DETECTED NOT DETECTED Final   Haemophilus influenzae NOT DETECTED NOT DETECTED Final   Neisseria meningitidis NOT DETECTED NOT DETECTED Final   Pseudomonas aeruginosa NOT DETECTED NOT DETECTED Final   Stenotrophomonas maltophilia NOT DETECTED NOT DETECTED Final   Candida albicans NOT DETECTED NOT DETECTED Final   Candida auris NOT DETECTED NOT DETECTED Final   Candida glabrata NOT DETECTED NOT DETECTED Final   Candida krusei NOT DETECTED NOT DETECTED Final   Candida parapsilosis NOT DETECTED NOT DETECTED  Final   Candida tropicalis NOT DETECTED NOT DETECTED Final   Cryptococcus neoformans/gattii NOT DETECTED NOT DETECTED Final   Methicillin resistance mecA/C NOT DETECTED NOT DETECTED Final    Comment: Performed at [facility], [address], [place], [place] [address]  MRSA PCR Screening     Status: None   Collection Time: 03/25/20  9:59 AM   Specimen: Nasal Mucosa; Nasopharyngeal  Result Value Ref Range Status   MRSA by PCR NEGATIVE NEGATIVE Final    Comment:        The GeneXpert MRSA Assay (FDA approved for NASAL specimens only), is one component of a comprehensive MRSA colonization surveillance program. It is not intended to diagnose MRSA infection nor to guide or monitor treatment for MRSA infections. Performed at [facility], [address] [place], [place] [address]   Body fluid culture w Gram Stain     Status: None   Collection Time: 03/25/20  4:45 PM   Specimen: Pleura; Body Fluid  Result Value Ref Range Status   Specimen Description PLEURAL FLUID RIGHT  Final   Special Requests Normal  Final   Gram Stain   Final    RARE WBC PRESENT, PREDOMINANTLY MONONUCLEAR NO ORGANISMS SEEN    Culture   Final    NO GROWTH Performed at [facility], [address], [place], [place] [address]    Report Status 03/28/2020 FINAL  Final  Culture, blood (routine x 2)     Status: None (Preliminary result)  Collection Time: 03/26/20 11:00 AM   Specimen: BLOOD  Result Value Ref Range Status   Specimen Description BLOOD SITE NOT SPECIFIED  Final   Special Requests   Final    BOTTLES DRAWN AEROBIC ONLY Blood Culture adequate volume   Culture   Final    NO GROWTH 4 DAYS Performed at [facility], [address], [place], [place] [address]    Report Status PENDING  Incomplete  Culture, blood (routine x 2)     Status: None (Preliminary result)   Collection Time: 03/26/20 11:07 AM   Specimen: BLOOD LEFT ARM  Result Value Ref Range Status   Specimen  Description BLOOD LEFT ARM  Final   Special Requests   Final    BOTTLES DRAWN AEROBIC ONLY Blood Culture results may not be optimal due to an inadequate volume of blood received in culture bottles   Culture   Final    NO GROWTH 4 DAYS Performed at [facility], [address] [place], [place] [address]    Report Status PENDING  Incomplete    FURTHER DISCHARGE INSTRUCTIONS:  Get Medicines reviewed and adjusted: Please take all your medications with you for your next visit with your Primary MD  Laboratory/radiological data: Please request your Primary MD to go over all hospital tests and procedure/radiological results at the follow up, please ask your Primary MD to get all Hospital records sent to his/her office.  In some cases, they will be blood work, cultures and biopsy results pending at the time of your discharge. Please request that your primary care M.D. goes through all the records of your hospital data and follows up on these results.  Also Note the following: If you experience worsening of your admission symptoms, develop shortness of breath, life threatening emergency, suicidal or homicidal thoughts you must seek medical attention immediately by calling 911 or calling your MD immediately  if symptoms less severe.  You must read complete instructions/literature along with all the possible adverse reactions/side effects for all the Medicines you take and that have been prescribed to you. Take any new Medicines after you have completely understood and accpet all the possible adverse reactions/side effects.   Do not drive when taking Pain medications or sleeping medications (Benzodaizepines)  Do not take more than prescribed Pain, Sleep and Anxiety Medications. It is not advisable to combine anxiety,sleep and pain medications without talking with your primary care practitioner  Special Instructions: If you have smoked or chewed Tobacco  in the last 2 yrs please stop smoking,  stop any regular Alcohol  and or any Recreational drug use.  Wear Seat belts while driving.  Please note: You were cared for by a hospitalist during your hospital stay. Once you are discharged, your primary care physician will handle any further medical issues. Please note that NO REFILLS for any discharge medications will be authorized once you are discharged, as it is imperative that you return to your primary care physician (or establish a relationship with a primary care physician if you do not have one) for your post hospital discharge needs so that they can reassess your need for medications and monitor your lab values.  Total Time spent coordinating discharge including counseling, education and face to face time equals 35 minutes.  Signed: Cyril Railey 03/30/2020 11:12 AM

## 2020-03-30 NOTE — TOC Transition Note (Signed)
Transition of Care [facility]) - CM/SW Discharge Note   Patient Details  Name: Mike Moran MRN: [id number] Date of Birth: 05-06-1955  [facility] CM/SW Contact:  Kermit Balo, RN Phone Number: 03/30/2020, 1:52 PM   Clinical Narrative:    Pt is discharging home with self care. No oxygen needs per MD.  Pt has transportation home.   Final next level of care: Home/Self Care Barriers to Discharge: No Barriers Identified   Patient Goals and CMS Choice        Discharge Placement                       Discharge Plan and Services                                     Social Determinants of Health (SDOH) Interventions     Readmission Risk Interventions No flowsheet data found.

## 2020-03-31 ENCOUNTER — Other Ambulatory Visit (HOSPITAL_COMMUNITY): Payer: Self-pay | Admitting: Psychiatry

## 2020-03-31 ENCOUNTER — Telehealth (HOSPITAL_COMMUNITY): Payer: Self-pay | Admitting: *Deleted

## 2020-03-31 LAB — CULTURE, BLOOD (ROUTINE X 2)
Culture: NO GROWTH
Culture: NO GROWTH
Special Requests: ADEQUATE

## 2020-03-31 MED ORDER — HYDROXYZINE HCL 25 MG PO TABS: 25.0000 mg | ORAL_TABLET | Freq: Four times a day (QID) | ORAL | 0 refills | Status: DC | PRN

## 2020-03-31 MED ORDER — DULOXETINE HCL 60 MG PO CPEP: 60.0000 mg | ORAL_CAPSULE | Freq: Every day | ORAL | 0 refills | Status: DC

## 2020-03-31 MED ORDER — RISPERIDONE 0.5 MG PO TABS: ORAL_TABLET | ORAL | 2 refills | Status: DC

## 2020-03-31 NOTE — Telephone Encounter (Signed)
Informed patient and his wife with what provider stated. They would like for his Medication to now go to [place] from now on.   They also would like their Risperidone sent into the pharmacy to get refilled.

## 2020-03-31 NOTE — Telephone Encounter (Signed)
Already sent.

## 2020-03-31 NOTE — Telephone Encounter (Signed)
Patient wife called stating patient only have 3 days left of his risperiDONE and his hydrOXYzine. Per pt wife she is at the pharmacy right now and they do not have any refills on file. Per pt chart, pt is next f/u from last visit is in 2023

## 2020-03-31 NOTE — Telephone Encounter (Signed)
Staff called patient pharmacy to verify that patient risperiDONE was received by provider today. Per pharmacy staff they do not have new script on file for this medication and did not receive one today. Per pt chart, the last time this medication was sent to the pharmacy [facility]) was in Jan 2022.  Pharmacy and patient would like for pt risperiDONE to be sent to [facility].

## 2020-03-31 NOTE — Telephone Encounter (Signed)
He told me last time he called to send in a weeks supply because they are waiting for meds from mail order pharmacy. I sent in a 30 day supply now to [facility]. Please find out where they want meds in the future

## 2020-03-31 NOTE — Telephone Encounter (Signed)
noted 

## 2020-03-31 NOTE — Telephone Encounter (Signed)
sent 

## 2020-04-09 ENCOUNTER — Telehealth (HOSPITAL_COMMUNITY): Payer: Self-pay

## 2020-04-09 NOTE — Telephone Encounter (Signed)
Medication management - prior authorization request submitted on-line with CoverMyMeds for patient's continued prescription of Hydroxyxzine under [employer].  Decision pending.

## 2020-04-10 NOTE — Telephone Encounter (Signed)
Medication management - Telephone call from Tiffany, representative with [facility] Medicare to report patient's Hydroxyzine was approved from 04/09/20-04/09/21.  Called pharmacist at [facility] to inform of approval.

## 2020-04-25 ENCOUNTER — Other Ambulatory Visit (HOSPITAL_COMMUNITY): Payer: Self-pay | Admitting: Psychiatry

## 2020-05-01 DIAGNOSIS — H35371 Puckering of macula, right eye: Secondary | ICD-10-CM | POA: Diagnosis not present

## 2020-05-01 DIAGNOSIS — H2513 Age-related nuclear cataract, bilateral: Secondary | ICD-10-CM | POA: Diagnosis not present

## 2020-05-31 ENCOUNTER — Other Ambulatory Visit (HOSPITAL_COMMUNITY): Payer: Self-pay | Admitting: Psychiatry

## 2020-07-09 ENCOUNTER — Other Ambulatory Visit (HOSPITAL_COMMUNITY): Payer: Self-pay | Admitting: Psychiatry

## 2020-08-26 ENCOUNTER — Other Ambulatory Visit (HOSPITAL_COMMUNITY): Payer: Self-pay | Admitting: Psychiatry

## 2020-09-16 DIAGNOSIS — H31011 Macula scars of posterior pole (postinflammatory) (post-traumatic), right eye: Secondary | ICD-10-CM | POA: Diagnosis not present

## 2020-09-29 DIAGNOSIS — L720 Epidermal cyst: Secondary | ICD-10-CM | POA: Diagnosis not present

## 2020-10-27 ENCOUNTER — Other Ambulatory Visit: Payer: Self-pay

## 2020-10-27 ENCOUNTER — Encounter (HOSPITAL_COMMUNITY): Payer: Self-pay | Admitting: Emergency Medicine

## 2020-10-27 ENCOUNTER — Emergency Department (HOSPITAL_COMMUNITY)
Admission: EM | Admit: 2020-10-27 | Discharge: 2020-10-27 | Disposition: A | Discharge status: Home / Self Care | Payer: Medicare Other | Attending: Emergency Medicine | Admitting: Emergency Medicine

## 2020-10-27 ENCOUNTER — Emergency Department (HOSPITAL_COMMUNITY): Payer: Medicare Other

## 2020-10-27 DIAGNOSIS — Z8616 Personal history of COVID-19: Secondary | ICD-10-CM | POA: Insufficient documentation

## 2020-10-27 DIAGNOSIS — Z7982 Long term (current) use of aspirin: Secondary | ICD-10-CM | POA: Diagnosis not present

## 2020-10-27 DIAGNOSIS — R197 Diarrhea, unspecified: Secondary | ICD-10-CM | POA: Diagnosis not present

## 2020-10-27 DIAGNOSIS — R110 Nausea: Secondary | ICD-10-CM | POA: Insufficient documentation

## 2020-10-27 DIAGNOSIS — Z87891 Personal history of nicotine dependence: Secondary | ICD-10-CM | POA: Insufficient documentation

## 2020-10-27 DIAGNOSIS — R1031 Right lower quadrant pain: Secondary | ICD-10-CM | POA: Diagnosis not present

## 2020-10-27 DIAGNOSIS — R109 Unspecified abdominal pain: Secondary | ICD-10-CM | POA: Diagnosis present

## 2020-10-27 LAB — CBC WITH DIFFERENTIAL/PLATELET
Abs Immature Granulocytes: 0.02 10*3/uL (ref 0.00–0.07)
Basophils Absolute: 0.1 10*3/uL (ref 0.0–0.1)
Basophils Relative: 1 %
Eosinophils Absolute: 0.3 10*3/uL (ref 0.0–0.5)
Eosinophils Relative: 3 %
HCT: 34.1 % — ABNORMAL LOW (ref 39.0–52.0)
Hemoglobin: 11.3 g/dL — ABNORMAL LOW (ref 13.0–17.0)
Immature Granulocytes: 0 %
Lymphocytes Relative: 30 %
Lymphs Abs: 2.4 10*3/uL (ref 0.7–4.0)
MCH: 31.2 pg (ref 26.0–34.0)
MCHC: 33.1 g/dL (ref 30.0–36.0)
MCV: 94.2 fL (ref 80.0–100.0)
Monocytes Absolute: 0.6 10*3/uL (ref 0.1–1.0)
Monocytes Relative: 8 %
Neutro Abs: 4.8 10*3/uL (ref 1.7–7.7)
Neutrophils Relative %: 58 %
Platelets: 345 10*3/uL (ref 150–400)
RBC: 3.62 MIL/uL — ABNORMAL LOW (ref 4.22–5.81)
RDW: 14.7 % (ref 11.5–15.5)
WBC: 8.2 10*3/uL (ref 4.0–10.5)
nRBC: 0 % (ref 0.0–0.2)

## 2020-10-27 LAB — URINALYSIS, ROUTINE W REFLEX MICROSCOPIC
Bilirubin Urine: NEGATIVE
Glucose, UA: NEGATIVE mg/dL
Hgb urine dipstick: NEGATIVE
Ketones, ur: NEGATIVE mg/dL
Leukocytes,Ua: NEGATIVE
Nitrite: NEGATIVE
Protein, ur: NEGATIVE mg/dL
Specific Gravity, Urine: 1.015 (ref 1.005–1.030)
pH: 8 (ref 5.0–8.0)

## 2020-10-27 LAB — COMPREHENSIVE METABOLIC PANEL
ALT: 25 U/L (ref 0–44)
AST: 27 U/L (ref 15–41)
Albumin: 4.3 g/dL (ref 3.5–5.0)
Alkaline Phosphatase: 48 U/L (ref 38–126)
Anion gap: 6 (ref 5–15)
BUN: 26 mg/dL — ABNORMAL HIGH (ref 8–23)
CO2: 28 mmol/L (ref 22–32)
Calcium: 9.3 mg/dL (ref 8.9–10.3)
Chloride: 103 mmol/L (ref 98–111)
Creatinine, Ser: 0.76 mg/dL (ref 0.61–1.24)
GFR, Estimated: >60 mL/min (ref >60)
Glucose, Bld: 125 mg/dL — ABNORMAL HIGH (ref 70–99)
Potassium: 3.8 mmol/L (ref 3.5–5.1)
Sodium: 137 mmol/L (ref 135–145)
Total Bilirubin: 0.3 mg/dL (ref 0.3–1.2)
Total Protein: 7.1 g/dL (ref 6.5–8.1)

## 2020-10-27 LAB — LIPASE, BLOOD: Lipase: 33 U/L (ref 11–51)

## 2020-10-27 MED ORDER — SODIUM CHLORIDE 0.9 % IV BOLUS
1000.0000 mL | Freq: Once | INTRAVENOUS | Status: AC
  Administered 2020-10-27: 1000 mL via INTRAVENOUS

## 2020-10-27 MED ORDER — IOHEXOL 350 MG/ML SOLN
80.0000 mL | Freq: Once | INTRAVENOUS | Status: AC | PRN
  Administered 2020-10-27: 80 mL via INTRAVENOUS

## 2020-10-27 MED ORDER — ONDANSETRON HCL 4 MG/2ML IJ SOLN
4.0000 mg | Freq: Once | INTRAMUSCULAR | Status: AC
  Administered 2020-10-27: 4 mg via INTRAVENOUS
  Filled 2020-10-27: qty 2

## 2020-10-27 MED ORDER — IOHEXOL 300 MG/ML  SOLN: 75.0000 mL | Freq: Once | INTRAMUSCULAR | Status: DC | PRN

## 2020-10-27 MED ORDER — HYDROMORPHONE HCL 1 MG/ML IJ SOLN
1.0000 mg | Freq: Once | INTRAMUSCULAR | Status: AC
Start: 2020-10-27 — End: 2020-10-27
  Administered 2020-10-27: 1 mg via INTRAVENOUS
  Filled 2020-10-27: qty 1

## 2020-10-27 NOTE — ED Triage Notes (Signed)
Pt c/o abd pain to RLQ due to hernia that he noticed about 4 wks ago. Pt states he notices more pain and increased size with every BM.

## 2020-10-27 NOTE — ED Provider Notes (Signed)
[name] EMERGENCY DEPARTMENT Provider Note   CSN: [id number] Arrival date & time: 10/27/20  0998     History Chief Complaint  Patient presents with   Abdominal Pain    Mike Moran is a 65 y.o. male.  Patient reports right inguinal pain for the past 4 to 5 weeks progressively worsening.  States occasionally he feels a bulge in that area which he feels is getting bigger.  He is having severe pain over the past 2 days with difficulty moving due to pain.  His daughter who is an ED nurse looked at the area and thought he could have a enlarged lymph node versus a hernia.  Patient reports nausea but no vomiting.  No fever.  Having intermittent diarrhea but moving his bowels every day.  Pain gets worse with attempted bowel movement.  Denies any black or bloody stools.  No chest pain or shortness of breath. Concerned that he has a hernia and wants it taken care of and " wants Dr. Lovell Sheehan to cut it out today". Denies any testicular pain.  Denies any pain with urination or blood in the urine  The history is provided by the patient.  Abdominal Pain Associated symptoms: diarrhea and nausea   Associated symptoms: no chest pain, no cough, no dysuria, no fever, no hematuria, no shortness of breath and no vomiting       Past Medical History:  Diagnosis Date   Anxiety    Depression     Patient Active Problem List   Diagnosis Date Noted   Pneumonia due to COVID-19 virus 03/25/2020   Respiratory failure with hypoxia (HCC) 03/25/2020   Pleural effusion on right 03/25/2020   Sepsis (HCC) 03/25/2020   Leukocytosis 03/25/2020   Normocytic anemia 03/25/2020   Thrombocytosis 03/25/2020   Pulmonary nodule 03/25/2020   Alcohol abuse 03/25/2020   Elevated brain natriuretic peptide (BNP) level 03/25/2020   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 No  pertinent surgical history.     History reviewed. No pertinent family history.  Social History   Tobacco Use   Smoking status: Former   Smokeless tobacco: Never  Substance Use Topics   Alcohol use: Yes    Alcohol/week: 0.0 standard drinks    Comment: occ- pt denies SA use   Drug use: No    Home Medications Prior to Admission medications   Medication Sig Start Date End Date Taking? Authorizing Provider  aspirin EC 81 MG tablet Take 1 tablet (81 mg total) by mouth daily. For heart health 08/04/15   Armandina Stammer I, NP  DULoxetine (CYMBALTA) 60 MG capsule TAKE (1) CAPSULE BY MOUTH ONCE DAILY. 09/01/20   Myrlene Broker, MD  hydrOXYzine (ATARAX/VISTARIL) 25 MG tablet TAKE ONE TABLET BY MOUTH EVERY 6 HOURS AS NEEDED FOR ANXIETY. 09/01/20   Myrlene Broker, MD  Multiple Vitamins-Minerals (MULTIVITAMIN WITH MINERALS) tablet Take 1 tablet by mouth daily.    [provider]  oxyCODONE (OXY IR/ROXICODONE) 5 MG immediate release tablet Take 1 tablet (5 mg total) by mouth every 6 (six) hours as needed for moderate pain or severe pain (unrelieved by tramdol). 03/30/20   Ghimire, Werner Lean, MD  pantoprazole (PROTONIX) 40 MG tablet Take 1 tablet (40 mg total) by mouth daily. 03/30/20   Ghimire, Werner Lean, MD  polyethylene glycol (MIRALAX / GLYCOLAX) 17 g packet Take 17  g by mouth daily. 03/31/20   Ghimire, Werner Lean, MD  risperiDONE (RISPERDAL) 0.5 MG tablet TAKE (1) TABLET BY MOUTH AT BEDTIME. 09/01/20   Myrlene Broker, MD  traZODone (DESYREL) 50 MG tablet TAKE (1) TABLET BY MOUTH AT BEDTIME AS NEEDED. MAY REPEAT DOSE (1) TIME IF NEEDED FOR SLEEP. 06/02/20   Myrlene Broker, MD    Allergies    Codeine and Lexapro [escitalopram oxalate]  Review of Systems   Review of Systems  Constitutional:  Negative for activity change, appetite change and fever.  HENT:  Negative for congestion.   Respiratory:  Negative for cough, chest tightness and shortness of breath.   Cardiovascular:  Negative for  chest pain.  Gastrointestinal:  Positive for abdominal pain, diarrhea and nausea. Negative for vomiting.  Genitourinary:  Negative for dysuria and hematuria.  Musculoskeletal:  Negative for arthralgias.  Skin:  Negative for rash.  Neurological:  Negative for dizziness, weakness and headaches.   all other systems are negative except as noted in the HPI and PMH.   Physical Exam Updated Vital Signs BP 121/73   Pulse 60   Temp 98.2 F (36.8 C) (Oral)   Resp 20   Ht 6' (1.829 m)   Wt 58.9 kg   SpO2 100%   BMI 17.61 kg/m   Physical Exam Vitals and nursing note reviewed.  Constitutional:      General: He is not in acute distress.    Appearance: He is well-developed.  HENT:     Head: Normocephalic and atraumatic.     Mouth/Throat:     Pharynx: No oropharyngeal exudate.  Eyes:     Conjunctiva/sclera: Conjunctivae normal.     Pupils: Pupils are equal, round, and reactive to light.  Neck:     Comments: No meningismus. Cardiovascular:     Rate and Rhythm: Normal rate and regular rhythm.     Heart sounds: Normal heart sounds. No murmur heard. Pulmonary:     Effort: Pulmonary effort is normal. No respiratory distress.     Breath sounds: Normal breath sounds.  Abdominal:     Palpations: Abdomen is soft.     Tenderness: There is no abdominal tenderness. There is no guarding or rebound.     Comments: No obvious hernia seen with patient lying down or standing. Mild right-sided lower abdominal tenderness.  Genitourinary:    Comments: No testicular tenderness Musculoskeletal:        General: No tenderness. Normal range of motion.     Cervical back: Normal range of motion and neck supple.  Skin:    General: Skin is warm.  Neurological:     Mental Status: He is alert and oriented to person, place, and time.     Cranial Nerves: No cranial nerve deficit.     Motor: No abnormal muscle tone.     Coordination: Coordination normal.     Comments:  5/5 strength throughout. CN 2-12  intact.Equal grip strength.   Psychiatric:        Behavior: Behavior normal.    ED Results / Procedures / Treatments   Labs (all labs ordered are listed, but only abnormal results are displayed) Labs Reviewed  CBC WITH DIFFERENTIAL/PLATELET - Abnormal; Notable for the following components:      Result Value   RBC 3.62 (*)    Hemoglobin 11.3 (*)    HCT 34.1 (*)    All other components within normal limits  COMPREHENSIVE METABOLIC PANEL  LIPASE, BLOOD  URINALYSIS, ROUTINE W REFLEX  MICROSCOPIC    EKG None  Radiology No results found.  Procedures Procedures   Medications Ordered in ED Medications  sodium chloride 0.9 % bolus 1,000 mL (has no administration in time range)  ondansetron (ZOFRAN) injection 4 mg (has no administration in time range)  HYDROmorphone (DILAUDID) injection 1 mg (has no administration in time range)    ED Course  I have reviewed the triage vital signs and the nursing notes.  Pertinent labs & imaging results that were available during my care of the patient were reviewed by me and considered in my medical decision making (see chart for details).    MDM Rules/Calculators/A&P                          Right inguinal pain for several weeks progressively worsening with concern for hernia No evidence of clinical obstruction or incarceration or strangulation  Will treat symptoms, obtain labs and urinalysis.  Will obtain CT scan to evaluate for evidence of hernia as not as visible clinically.  Care to be transferred at shift change Final Clinical Impression(s) / ED Diagnoses Final diagnoses:  None    Rx / DC Orders ED Discharge Orders     None        Icy Fuhrmann, Jeannett Senior, MD 10/27/20 [phone]

## 2020-10-27 NOTE — ED Notes (Signed)
Attempted to call spouse per pt request. No answer.

## 2020-10-27 NOTE — ED Notes (Signed)
Called CT to see status of CT. New order placed per Dr. Bernette Mayers.

## 2020-10-27 NOTE — ED Provider Notes (Signed)
Care of the patient assumed at the change of shift. Here for suspected inguinal hernia. Pending CT.  Physical Exam  BP 111/70   Pulse (!) 43   Temp 98.2 F (36.8 C) (Oral)   Resp 16   Ht 6' (1.829 m)   Wt 58.9 kg   SpO2 97%   BMI 17.61 kg/m   Physical Exam  ED Course/Procedures   Clinical Course as of 10/27/20 1013  Mon Oct 27, 2020  1008 CT is neg. Labs unremarkable. Patient has small non tender lymph nodes. Recommend PCP follow up.  [CS]    Clinical Course User Index [CS] Pollyann Savoy, MD    Procedures  MDM         Pollyann Savoy, MD 10/27/20 1013

## 2020-10-28 ENCOUNTER — Ambulatory Visit: Payer: Medicare Other | Admitting: General Surgery

## 2020-10-28 ENCOUNTER — Encounter: Payer: Self-pay | Admitting: General Surgery

## 2020-10-28 VITALS — BP 99/72 | HR 97 | Temp 98.0°F | Resp 20 | Ht 72.0 in | Wt 123.0 lb

## 2020-10-28 DIAGNOSIS — K4090 Unilateral inguinal hernia, without obstruction or gangrene, not specified as recurrent: Secondary | ICD-10-CM | POA: Diagnosis not present

## 2020-10-28 MED ORDER — OXYCODONE-ACETAMINOPHEN 7.5-325 MG PO TABS: 1.0000 | ORAL_TABLET | Freq: Four times a day (QID) | ORAL | 0 refills | Status: DC | PRN

## 2020-10-28 NOTE — Progress Notes (Signed)
Mike Moran; [id number]; October 06, 1955   HPI Patient is a 65 year old white male who was referred to my care by Dr. [name] in the emergency room for evaluation and treatment of a swelling in the right inguinal region.  Patient states is been present for over a month.  It is made worse with straining.  He was seen in the emergency room and a CT scan of the abdomen did not reveal hernia.  It is felt that he may have a swollen lymph node.  He denies any nausea or vomiting.  The pain radiates to his right testicle. Past Medical History:  Diagnosis Date   Anxiety    Depression     History reviewed. No pertinent surgical history.  History reviewed. No pertinent family history.  Current Outpatient Medications on File Prior to Visit  Medication Sig Dispense Refill   aspirin EC 81 MG tablet Take 1 tablet (81 mg total) by mouth daily. For heart health     DULoxetine (CYMBALTA) 60 MG capsule TAKE (1) CAPSULE BY MOUTH ONCE DAILY. 90 capsule 2   hydrOXYzine (ATARAX/VISTARIL) 25 MG tablet TAKE ONE TABLET BY MOUTH EVERY 6 HOURS AS NEEDED FOR ANXIETY. 90 tablet 5   Multiple Vitamins-Minerals (MULTIVITAMIN WITH MINERALS) tablet Take 1 tablet by mouth daily.     pantoprazole (PROTONIX) 40 MG tablet Take 1 tablet (40 mg total) by mouth daily. 30 tablet 0   polyethylene glycol (MIRALAX / GLYCOLAX) 17 g packet Take 17 g by mouth daily. 14 each 0   risperiDONE (RISPERDAL) 0.5 MG tablet TAKE (1) TABLET BY MOUTH AT BEDTIME. 30 tablet 5   traZODone (DESYREL) 50 MG tablet TAKE (1) TABLET BY MOUTH AT BEDTIME AS NEEDED. MAY REPEAT DOSE (1) TIME IF NEEDED FOR SLEEP. 60 tablet 2   No current facility-administered medications on file prior to visit.    Allergies  Allergen Reactions   Codeine Nausea And Vomiting   Lexapro [Escitalopram Oxalate] Other (See Comments)    lethargic    Social History   Substance and Sexual Activity  Alcohol Use Yes   Alcohol/week: 0.0 standard drinks   Comment: occ- pt  denies SA use    Social History   Tobacco Use  Smoking Status Former  Smokeless Tobacco Never    Review of Systems  Constitutional: Negative.   HENT: Negative.    Eyes: Negative.   Respiratory: Negative.    Cardiovascular: Negative.   Gastrointestinal:  Positive for abdominal pain.  Genitourinary: Negative.   Musculoskeletal: Negative.   Skin: Negative.   Endo/Heme/Allergies: Negative.   Psychiatric/Behavioral:  The patient is nervous/anxious.    Objective   Vitals:   10/28/20 1539  BP: 99/72  Pulse: 97  Resp: 20  Temp: 98 F (36.7 C)  SpO2: 94%    Physical Exam Vitals reviewed.  Constitutional:      Appearance: Normal appearance. He is normal weight. He is not ill-appearing.  HENT:     Head: Normocephalic and atraumatic.  Cardiovascular:     Rate and Rhythm: Normal rate and regular rhythm.     Heart sounds: Normal heart sounds. No murmur heard.   No friction rub. No gallop.  Pulmonary:     Effort: Pulmonary effort is normal. No respiratory distress.     Breath sounds: Normal breath sounds. No stridor. No wheezing, rhonchi or rales.  Abdominal:     General: Abdomen is flat. Bowel sounds are normal.     Palpations: Abdomen is soft. There is no mass.  Tenderness: There is abdominal tenderness. There is no guarding or rebound.     Hernia: A hernia is present.     Comments: Tender over right inguinal hernia that is easily reducible.  Genitourinary:    Testes: Normal.  Skin:    General: Skin is warm and dry.  Neurological:     Mental Status: He is alert and oriented to person, place, and time.   ER notes reviewed Assessment  Symptomatic right inguinal hernia Plan  Patient is scheduled for right inguinal herniorrhaphy with mesh on 11/05/2020.  The risks and benefits of the procedure including bleeding, infection, mesh use, the possibility of recurrence of the hernia were fully explained to the patient, who gave informed consent.  Patient will be given  Percocet 7.5/325 1 tablet every 6 hours as needed pain, dispense 20.

## 2020-10-28 NOTE — H&P (View-Only) (Signed)
Mike Moran; [id number]; October 06, 1955   HPI Patient is a 65 year old white male who was referred to my care by Dr. [name] in the emergency room for evaluation and treatment of a swelling in the right inguinal region.  Patient states is been present for over a month.  It is made worse with straining.  He was seen in the emergency room and a CT scan of the abdomen did not reveal hernia.  It is felt that he may have a swollen lymph node.  He denies any nausea or vomiting.  The pain radiates to his right testicle. Past Medical History:  Diagnosis Date   Anxiety    Depression     History reviewed. No pertinent surgical history.  History reviewed. No pertinent family history.  Current Outpatient Medications on File Prior to Visit  Medication Sig Dispense Refill   aspirin EC 81 MG tablet Take 1 tablet (81 mg total) by mouth daily. For heart health     DULoxetine (CYMBALTA) 60 MG capsule TAKE (1) CAPSULE BY MOUTH ONCE DAILY. 90 capsule 2   hydrOXYzine (ATARAX/VISTARIL) 25 MG tablet TAKE ONE TABLET BY MOUTH EVERY 6 HOURS AS NEEDED FOR ANXIETY. 90 tablet 5   Multiple Vitamins-Minerals (MULTIVITAMIN WITH MINERALS) tablet Take 1 tablet by mouth daily.     pantoprazole (PROTONIX) 40 MG tablet Take 1 tablet (40 mg total) by mouth daily. 30 tablet 0   polyethylene glycol (MIRALAX / GLYCOLAX) 17 g packet Take 17 g by mouth daily. 14 each 0   risperiDONE (RISPERDAL) 0.5 MG tablet TAKE (1) TABLET BY MOUTH AT BEDTIME. 30 tablet 5   traZODone (DESYREL) 50 MG tablet TAKE (1) TABLET BY MOUTH AT BEDTIME AS NEEDED. MAY REPEAT DOSE (1) TIME IF NEEDED FOR SLEEP. 60 tablet 2   No current facility-administered medications on file prior to visit.    Allergies  Allergen Reactions   Codeine Nausea And Vomiting   Lexapro [Escitalopram Oxalate] Other (See Comments)    lethargic    Social History   Substance and Sexual Activity  Alcohol Use Yes   Alcohol/week: 0.0 standard drinks   Comment: occ- pt  denies SA use    Social History   Tobacco Use  Smoking Status Former  Smokeless Tobacco Never    Review of Systems  Constitutional: Negative.   HENT: Negative.    Eyes: Negative.   Respiratory: Negative.    Cardiovascular: Negative.   Gastrointestinal:  Positive for abdominal pain.  Genitourinary: Negative.   Musculoskeletal: Negative.   Skin: Negative.   Endo/Heme/Allergies: Negative.   Psychiatric/Behavioral:  The patient is nervous/anxious.    Objective   Vitals:   10/28/20 1539  BP: 99/72  Pulse: 97  Resp: 20  Temp: 98 F (36.7 C)  SpO2: 94%    Physical Exam Vitals reviewed.  Constitutional:      Appearance: Normal appearance. He is normal weight. He is not ill-appearing.  HENT:     Head: Normocephalic and atraumatic.  Cardiovascular:     Rate and Rhythm: Normal rate and regular rhythm.     Heart sounds: Normal heart sounds. No murmur heard.   No friction rub. No gallop.  Pulmonary:     Effort: Pulmonary effort is normal. No respiratory distress.     Breath sounds: Normal breath sounds. No stridor. No wheezing, rhonchi or rales.  Abdominal:     General: Abdomen is flat. Bowel sounds are normal.     Palpations: Abdomen is soft. There is no mass.  Tenderness: There is abdominal tenderness. There is no guarding or rebound.     Hernia: A hernia is present.     Comments: Tender over right inguinal hernia that is easily reducible.  Genitourinary:    Testes: Normal.  Skin:    General: Skin is warm and dry.  Neurological:     Mental Status: He is alert and oriented to person, place, and time.   ER notes reviewed Assessment  Symptomatic right inguinal hernia Plan  Patient is scheduled for right inguinal herniorrhaphy with mesh on 11/05/2020.  The risks and benefits of the procedure including bleeding, infection, mesh use, the possibility of recurrence of the hernia were fully explained to the patient, who gave informed consent.  Patient will be given  Percocet 7.5/325 1 tablet every 6 hours as needed pain, dispense 20.

## 2020-10-29 NOTE — Patient Instructions (Signed)
Mike Moran  10/29/2020     @PREFPERIOPPHARMACY @   Your procedure is scheduled on  11/05/2020.   Report to 11/07/2020 at  1135 AM   Call this number if you have problems the morning of surgery:  [phone]   Remember:  Do not eat or drink after midnight.      Take these medicines the morning of surgery with A SIP OF WATER       cymbalta, hydroxyzine, oxycodone (If needed), protonix.    Do not wear jewelry, make-up or nail polish.  Do not wear lotions, powders, or perfumes, or deodorant.  Do not shave 48 hours prior to surgery.  Men may shave face and neck.  Do not bring valuables to the hospital.  [facility] is not responsible for any belongings or valuables.  Contacts, dentures or bridgework may not be worn into surgery.  Leave your suitcase in the car.  After surgery it may be brought to your room.  For patients admitted to the hospital, discharge time will be determined by your treatment team.  Patients discharged the day of surgery will not be allowed to drive home and must have someone with them for 24 hours.    Special instructions:   DO NOT smoke tobacco or vape for 24 hours before your procedure.  Please read over the following fact sheets that you were given. Coughing and Deep Breathing, Surgical Site Infection Prevention, Anesthesia Post-op Instructions, and Care and Recovery After Surgery      Open Hernia Repair, Adult, Care After What can I expect after the procedure? After the procedure, it is common to have: Mild discomfort. Slight bruising. Mild swelling. Pain in the belly (abdomen). A small amount of blood from the cut from surgery (incision). Follow these instructions at home: Your doctor may give you more specific instructions. If you have problems, call your doctor. Medicines Take over-the-counter and prescription medicines only as told by your doctor. If told, take steps to prevent problems with pooping (constipation). You  may need to: Drink enough fluid to keep your pee (urine) pale yellow. Take medicines. You will be told what medicines to take. Eat foods that are high in fiber. These include beans, whole grains, and fresh fruits and vegetables. Limit foods that are high in fat and sugar. These include fried or sweet foods. Ask your doctor if you should avoid driving or using machines while you are taking your medicine. Incision care  Follow instructions from your doctor about how to take care of your incision. Make sure you: Wash your hands with soap and water for at least 20 seconds before and after you change your bandage (dressing). If you cannot use soap and water, use hand sanitizer. Change your bandage. Leave stitches or skin glue in place for at least 2 weeks. Leave tape strips alone unless you are told to take them off. You may trim the edges of the tape strips if they curl up. Check your incision every day for signs of infection. Check for: More redness, swelling, or pain. More fluid or blood. Warmth. Pus or a bad smell. Wear loose, soft clothing while your incision heals. Activity  Rest as told by your doctor. Do not lift anything that is heavier than 10 lb (4.5 kg), or the limit that you are told. Do not play contact sports until your doctor says that this is safe. If you were given a sedative during your procedure, do  not drive or use machines until your doctor says that it is safe. A sedative is a medicine that helps you relax. Return to your normal activities when your doctor says that it is safe. General instructions Do not take baths, swim, or use a hot tub. Ask your doctor about taking showers or sponge baths. Hold a pillow over your belly when you cough or sneeze. This helps with pain. Do not smoke or use any products that contain nicotine or tobacco. If you need help quitting, ask your doctor. Keep all follow-up visits. Contact a doctor if: You have any of these signs of infection  in or around your incision: More redness, swelling, or pain. More fluid or blood. Warmth. Pus. A bad smell. You have a fever or chills. You have blood in your poop (stool). You have not pooped (had a bowel movement) in 2-3 days. Medicine does not help your pain. Get help right away if: You have chest pain, or you are short of breath. You feel faint or light-headed. You have very bad pain. You vomit and your pain is worse. You have pain, swelling, or redness in a leg. These symptoms may be an emergency. Get help right away. Call your local emergency services (911 in the [place] Do not wait to see if the symptoms will go away. Do not drive yourself to the hospital. Summary After this procedure, it is common to have mild discomfort, slight bruising, and mild swelling. Follow instructions from your doctor about how to take care of your cut from surgery (incision). Check every day for signs of infection. Do not lift heavy objects or play contact sports until your doctor says it is safe. Return to your normal activities as told by your doctor. This information is not intended to replace advice given to you by your health care provider. Make sure you discuss any questions you have with your health care provider. Document Revised: 09/10/2019 Document Reviewed: 09/10/2019 Elsevier Patient Education  2022 [employer] General Anesthesia, Adult, Care After This sheet gives you information about how to care for yourself after your procedure. Your health care provider may also give you more specific instructions. If you have problems or questions, contact your health care provider. What can I expect after the procedure? After the procedure, the following side effects are common: Pain or discomfort at the IV site. Nausea. Vomiting. Sore throat. Trouble concentrating. Feeling cold or chills. Feeling weak or tired. Sleepiness and fatigue. Soreness and body aches. These side effects can affect  parts of the body that were not involved in surgery. Follow these instructions at home: For the time period you were told by your health care provider:  Rest. Do not participate in activities where you could fall or become injured. Do not drive or use machinery. Do not drink alcohol. Do not take sleeping pills or medicines that cause drowsiness. Do not make important decisions or sign legal documents. Do not take care of children on your own. Eating and drinking Follow any instructions from your health care provider about eating or drinking restrictions. When you feel hungry, start by eating small amounts of foods that are soft and easy to digest (bland), such as toast. Gradually return to your regular diet. Drink enough fluid to keep your urine pale yellow. If you vomit, rehydrate by drinking water, juice, or clear broth. General instructions If you have sleep apnea, surgery and certain medicines can increase your risk for breathing problems. Follow instructions from your health care  provider about wearing your sleep device: Anytime you are sleeping, including during daytime naps. While taking prescription pain medicines, sleeping medicines, or medicines that make you drowsy. Have a responsible adult stay with you for the time you are told. It is important to have someone help care for you until you are awake and alert. Return to your normal activities as told by your health care provider. Ask your health care provider what activities are safe for you. Take over-the-counter and prescription medicines only as told by your health care provider. If you smoke, do not smoke without supervision. Keep all follow-up visits as told by your health care provider. This is important. Contact a health care provider if: You have nausea or vomiting that does not get better with medicine. You cannot eat or drink without vomiting. You have pain that does not get better with medicine. You are unable to  pass urine. You develop a skin rash. You have a fever. You have redness around your IV site that gets worse. Get help right away if: You have difficulty breathing. You have chest pain. You have blood in your urine or stool, or you vomit blood. Summary After the procedure, it is common to have a sore throat or nausea. It is also common to feel tired. Have a responsible adult stay with you for the time you are told. It is important to have someone help care for you until you are awake and alert. When you feel hungry, start by eating small amounts of foods that are soft and easy to digest (bland), such as toast. Gradually return to your regular diet. Drink enough fluid to keep your urine pale yellow. Return to your normal activities as told by your health care provider. Ask your health care provider what activities are safe for you. This information is not intended to replace advice given to you by your health care provider. Make sure you discuss any questions you have with your health care provider. Document Revised: 10/11/2019 Document Reviewed: 05/10/2019 Elsevier Patient Education  2022 [employer] Inc. How to Use Chlorhexidine for Bathing Chlorhexidine gluconate (CHG) is a germ-killing (antiseptic) solution that is used to clean the skin. It can get rid of the bacteria that normally live on the skin and can keep them away for about 24 hours. To clean your skin with CHG, you may be given: A CHG solution to use in the shower or as part of a sponge bath. A prepackaged cloth that contains CHG. Cleaning your skin with CHG may help lower the risk for infection: While you are staying in the intensive care unit of the hospital. If you have a vascular access, such as a central line, to provide short-term or long-term access to your veins. If you have a catheter to drain urine from your bladder. If you are on a ventilator. A ventilator is a machine that helps you breathe by moving air in and out of  your lungs. After surgery. What are the risks? Risks of using CHG include: A skin reaction. Hearing loss, if CHG gets in your ears and you have a perforated eardrum. Eye injury, if CHG gets in your eyes and is not rinsed out. The CHG product catching fire. Make sure that you avoid smoking and flames after applying CHG to your skin. Do not use CHG: If you have a chlorhexidine allergy or have previously reacted to chlorhexidine. On babies younger than 30 months of age. How to use CHG solution Use CHG only as told  by your health care provider, and follow the instructions on the label. Use the full amount of CHG as directed. Usually, this is one bottle. During a shower Follow these steps when using CHG solution during a shower (unless your health care provider gives you different instructions): Start the shower. Use your normal soap and shampoo to wash your face and hair. Turn off the shower or move out of the shower stream. Pour the CHG onto a clean washcloth. Do not use any type of brush or rough-edged sponge. Starting at your neck, lather your body down to your toes. Make sure you follow these instructions: If you will be having surgery, pay special attention to the part of your body where you will be having surgery. Scrub this area for at least 1 minute. Do not use CHG on your head or face. If the solution gets into your ears or eyes, rinse them well with water. Avoid your genital area. Avoid any areas of skin that have broken skin, cuts, or scrapes. Scrub your back and under your arms. Make sure to wash skin folds. Let the lather sit on your skin for 1-2 minutes or as long as told by your health care provider. Thoroughly rinse your entire body in the shower. Make sure that all body creases and crevices are rinsed well. Dry off with a clean towel. Do not put any substances on your body afterward--such as powder, lotion, or perfume--unless you are told to do so by your health care  provider. Only use lotions that are recommended by the manufacturer. Put on clean clothes or pajamas. If it is the night before your surgery, sleep in clean sheets.  During a sponge bath Follow these steps when using CHG solution during a sponge bath (unless your health care provider gives you different instructions): Use your normal soap and shampoo to wash your face and hair. Pour the CHG onto a clean washcloth. Starting at your neck, lather your body down to your toes. Make sure you follow these instructions: If you will be having surgery, pay special attention to the part of your body where you will be having surgery. Scrub this area for at least 1 minute. Do not use CHG on your head or face. If the solution gets into your ears or eyes, rinse them well with water. Avoid your genital area. Avoid any areas of skin that have broken skin, cuts, or scrapes. Scrub your back and under your arms. Make sure to wash skin folds. Let the lather sit on your skin for 1-2 minutes or as long as told by your health care provider. Using a different clean, wet washcloth, thoroughly rinse your entire body. Make sure that all body creases and crevices are rinsed well. Dry off with a clean towel. Do not put any substances on your body afterward--such as powder, lotion, or perfume--unless you are told to do so by your health care provider. Only use lotions that are recommended by the manufacturer. Put on clean clothes or pajamas. If it is the night before your surgery, sleep in clean sheets. How to use CHG prepackaged cloths Only use CHG cloths as told by your health care provider, and follow the instructions on the label. Use the CHG cloth on clean, dry skin. Do not use the CHG cloth on your head or face unless your health care provider tells you to. When washing with the CHG cloth: Avoid your genital area. Avoid any areas of skin that have broken skin, cuts, or  scrapes. Before surgery Follow these steps  when using a CHG cloth to clean before surgery (unless your health care provider gives you different instructions): Using the CHG cloth, vigorously scrub the part of your body where you will be having surgery. Scrub using a back-and-forth motion for 3 minutes. The area on your body should be completely wet with CHG when you are done scrubbing. Do not rinse. Discard the cloth and let the area air-dry. Do not put any substances on the area afterward, such as powder, lotion, or perfume. Put on clean clothes or pajamas. If it is the night before your surgery, sleep in clean sheets.  For general bathing Follow these steps when using CHG cloths for general bathing (unless your health care provider gives you different instructions). Use a separate CHG cloth for each area of your body. Make sure you wash between any folds of skin and between your fingers and toes. Wash your body in the following order, switching to a new cloth after each step: The front of your neck, shoulders, and chest. Both of your arms, under your arms, and your hands. Your stomach and groin area, avoiding the genitals. Your right leg and foot. Your left leg and foot. The back of your neck, your back, and your buttocks. Do not rinse. Discard the cloth and let the area air-dry. Do not put any substances on your body afterward--such as powder, lotion, or perfume--unless you are told to do so by your health care provider. Only use lotions that are recommended by the manufacturer. Put on clean clothes or pajamas. Contact a health care provider if: Your skin gets irritated after scrubbing. You have questions about using your solution or cloth. You swallow any chlorhexidine. Call your local poison control center (1-605-514-1429 in the [place] Get help right away if: Your eyes itch badly, or they become very red or swollen. Your skin itches badly and is red or swollen. Your hearing changes. You have trouble seeing. You have swelling or  tingling in your mouth or throat. You have trouble breathing. These symptoms may represent a serious problem that is an emergency. Do not wait to see if the symptoms will go away. Get medical help right away. Call your local emergency services (911 in the [place] Do not drive yourself to the hospital. Summary Chlorhexidine gluconate (CHG) is a germ-killing (antiseptic) solution that is used to clean the skin. Cleaning your skin with CHG may help to lower your risk for infection. You may be given CHG to use for bathing. It may be in a bottle or in a prepackaged cloth to use on your skin. Carefully follow your health care provider's instructions and the instructions on the product label. Do not use CHG if you have a chlorhexidine allergy. Contact your health care provider if your skin gets irritated after scrubbing. This information is not intended to replace advice given to you by your health care provider. Make sure you discuss any questions you have with your health care provider. Document Revised: 04/07/2020 Document Reviewed: 04/07/2020 Elsevier Patient Education  2022 [employer].

## 2020-10-31 ENCOUNTER — Encounter (HOSPITAL_COMMUNITY)
Admission: RE | Admit: 2020-10-31 | Discharge: 2020-10-31 | Disposition: A | Discharge status: Home / Self Care | Payer: Medicare Other | Source: Ambulatory Visit | Attending: General Surgery | Admitting: General Surgery

## 2020-10-31 ENCOUNTER — Other Ambulatory Visit: Payer: Self-pay

## 2020-11-05 ENCOUNTER — Ambulatory Visit (HOSPITAL_COMMUNITY)
Admission: RE | Admit: 2020-11-05 | Discharge: 2020-11-05 | Disposition: A | Discharge status: Home / Self Care | Payer: Medicare Other | Source: Ambulatory Visit | Attending: General Surgery | Admitting: General Surgery

## 2020-11-05 ENCOUNTER — Ambulatory Visit (HOSPITAL_COMMUNITY): Payer: Medicare Other | Admitting: Anesthesiology

## 2020-11-05 ENCOUNTER — Encounter (HOSPITAL_COMMUNITY)
Admission: RE | Disposition: A | Discharge status: Home / Self Care | Payer: Self-pay | Source: Ambulatory Visit | Attending: General Surgery

## 2020-11-05 ENCOUNTER — Encounter (HOSPITAL_COMMUNITY): Payer: Self-pay | Admitting: General Surgery

## 2020-11-05 ENCOUNTER — Other Ambulatory Visit: Payer: Self-pay

## 2020-11-05 DIAGNOSIS — Z888 Allergy status to other drugs, medicaments and biological substances status: Secondary | ICD-10-CM | POA: Insufficient documentation

## 2020-11-05 DIAGNOSIS — Z885 Allergy status to narcotic agent status: Secondary | ICD-10-CM | POA: Diagnosis not present

## 2020-11-05 DIAGNOSIS — K4090 Unilateral inguinal hernia, without obstruction or gangrene, not specified as recurrent: Secondary | ICD-10-CM | POA: Insufficient documentation

## 2020-11-05 DIAGNOSIS — Z79899 Other long term (current) drug therapy: Secondary | ICD-10-CM | POA: Insufficient documentation

## 2020-11-05 DIAGNOSIS — Z87891 Personal history of nicotine dependence: Secondary | ICD-10-CM | POA: Diagnosis not present

## 2020-11-05 DIAGNOSIS — Z7982 Long term (current) use of aspirin: Secondary | ICD-10-CM | POA: Diagnosis not present

## 2020-11-05 HISTORY — PX: INGUINAL HERNIA REPAIR: SHX194

## 2020-11-05 SURGERY — REPAIR, HERNIA, INGUINAL, ADULT
Anesthesia: General | Site: Inguinal | Laterality: Right

## 2020-11-05 MED ORDER — FENTANYL CITRATE (PF) 100 MCG/2ML IJ SOLN
INTRAMUSCULAR | Status: AC
  Filled 2020-11-05: qty 2

## 2020-11-05 MED ORDER — CHLORHEXIDINE GLUCONATE 0.12 % MT SOLN
15.0000 mL | Freq: Once | OROMUCOSAL | Status: AC
  Administered 2020-11-05: 15 mL via OROMUCOSAL
  Filled 2020-11-05: qty 15

## 2020-11-05 MED ORDER — ORAL CARE MOUTH RINSE: 15.0000 mL | Freq: Once | OROMUCOSAL | Status: AC

## 2020-11-05 MED ORDER — CEFAZOLIN SODIUM-DEXTROSE 2-4 GM/100ML-% IV SOLN
2.0000 g | INTRAVENOUS | Status: AC
  Administered 2020-11-05: 2 g via INTRAVENOUS
  Filled 2020-11-05: qty 100

## 2020-11-05 MED ORDER — GLYCOPYRROLATE 0.2 MG/ML IJ SOLN
INTRAMUSCULAR | Status: DC | PRN
Start: 2020-11-05 — End: 2020-11-05
  Administered 2020-11-05: .2 mg via INTRAVENOUS

## 2020-11-05 MED ORDER — CHLORHEXIDINE GLUCONATE CLOTH 2 % EX PADS
6.0000 | MEDICATED_PAD | Freq: Once | CUTANEOUS | Status: AC
  Administered 2020-11-05: 6 via TOPICAL

## 2020-11-05 MED ORDER — ONDANSETRON HCL 4 MG/2ML IJ SOLN
INTRAMUSCULAR | Status: DC | PRN
  Administered 2020-11-05: 4 mg via INTRAVENOUS

## 2020-11-05 MED ORDER — BUPIVACAINE HCL (300 MG DOSE) 3 X 100 MG IL IMPL
DRUG_IMPLANT | Status: AC
  Filled 2020-11-05: qty 100

## 2020-11-05 MED ORDER — PHENYLEPHRINE 40 MCG/ML (10ML) SYRINGE FOR IV PUSH (FOR BLOOD PRESSURE SUPPORT)
PREFILLED_SYRINGE | INTRAVENOUS | Status: DC | PRN
  Administered 2020-11-05: 80 ug via INTRAVENOUS

## 2020-11-05 MED ORDER — HYDROMORPHONE HCL 1 MG/ML IJ SOLN: 0.2500 mg | INTRAMUSCULAR | Status: DC | PRN

## 2020-11-05 MED ORDER — SODIUM CHLORIDE 0.9 % IR SOLN
Status: DC | PRN
  Administered 2020-11-05: 1000 mL

## 2020-11-05 MED ORDER — FENTANYL CITRATE (PF) 100 MCG/2ML IJ SOLN
INTRAMUSCULAR | Status: DC | PRN
  Administered 2020-11-05: 50 ug via INTRAVENOUS

## 2020-11-05 MED ORDER — BUPIVACAINE HCL (300 MG DOSE) 3 X 100 MG IL IMPL
DRUG_IMPLANT | Status: DC | PRN
  Administered 2020-11-05: 200 mg

## 2020-11-05 MED ORDER — ONDANSETRON HCL 4 MG/2ML IJ SOLN: 4.0000 mg | Freq: Once | INTRAMUSCULAR | Status: DC | PRN

## 2020-11-05 MED ORDER — OXYCODONE-ACETAMINOPHEN 5-325 MG PO TABS
ORAL_TABLET | ORAL | Status: AC
  Filled 2020-11-05: qty 1

## 2020-11-05 MED ORDER — KETOROLAC TROMETHAMINE 30 MG/ML IJ SOLN
15.0000 mg | Freq: Once | INTRAMUSCULAR | Status: AC
  Administered 2020-11-05: 15 mg via INTRAVENOUS
  Filled 2020-11-05: qty 1

## 2020-11-05 MED ORDER — LIDOCAINE HCL (CARDIAC) PF 50 MG/5ML IV SOSY
PREFILLED_SYRINGE | INTRAVENOUS | Status: DC | PRN
  Administered 2020-11-05: 75 mg via INTRAVENOUS

## 2020-11-05 MED ORDER — MEPERIDINE HCL 50 MG/ML IJ SOLN: 6.2500 mg | INTRAMUSCULAR | Status: DC | PRN

## 2020-11-05 MED ORDER — OXYCODONE-ACETAMINOPHEN 7.5-325 MG PO TABS: 1.0000 | ORAL_TABLET | Freq: Four times a day (QID) | ORAL | 0 refills | Status: DC | PRN

## 2020-11-05 MED ORDER — OXYCODONE-ACETAMINOPHEN 5-325 MG PO TABS
1.0000 | ORAL_TABLET | Freq: Once | ORAL | Status: AC
  Administered 2020-11-05: 1 via ORAL

## 2020-11-05 MED ORDER — EPHEDRINE SULFATE 50 MG/ML IJ SOLN
INTRAMUSCULAR | Status: DC | PRN
  Administered 2020-11-05: 5 mg via INTRAVENOUS
  Administered 2020-11-05 (×2): 10 mg via INTRAVENOUS

## 2020-11-05 MED ORDER — PROPOFOL 10 MG/ML IV BOLUS
INTRAVENOUS | Status: DC | PRN
  Administered 2020-11-05: 30 mg via INTRAVENOUS
  Administered 2020-11-05: 150 mg via INTRAVENOUS

## 2020-11-05 MED ORDER — LACTATED RINGERS IV SOLN
INTRAVENOUS | Status: DC
  Administered 2020-11-05: 1000 mL via INTRAVENOUS

## 2020-11-05 SURGICAL SUPPLY — 33 items
ADH SKN CLS APL DERMABOND .7 (GAUZE/BANDAGES/DRESSINGS) ×1
CLOTH BEACON ORANGE TIMEOUT ST (SAFETY) ×2 IMPLANT
COVER LIGHT HANDLE STERIS (MISCELLANEOUS) ×4 IMPLANT
DERMABOND ADVANCED (GAUZE/BANDAGES/DRESSINGS) ×1
DERMABOND ADVANCED .7 DNX12 (GAUZE/BANDAGES/DRESSINGS) ×1 IMPLANT
DRAIN PENROSE 0.5X18 (DRAIN) ×2 IMPLANT
ELECT REM PT RETURN 9FT ADLT (ELECTROSURGICAL) ×2
ELECTRODE REM PT RTRN 9FT ADLT (ELECTROSURGICAL) ×1 IMPLANT
GAUZE 4X4 16PLY ~~LOC~~+RFID DBL (SPONGE) ×1 IMPLANT
GAUZE SPONGE 4X4 12PLY STRL (GAUZE/BANDAGES/DRESSINGS) ×2 IMPLANT
GLOVE SURG POLYISO LF SZ7.5 (GLOVE) ×2 IMPLANT
GLOVE SURG UNDER POLY LF SZ7 (GLOVE) ×6 IMPLANT
GOWN STRL REUS W/TWL LRG LVL3 (GOWN DISPOSABLE) ×6 IMPLANT
INST SET MINOR GENERAL (KITS) ×2 IMPLANT
KIT TURNOVER KIT A (KITS) ×2 IMPLANT
MANIFOLD NEPTUNE II (INSTRUMENTS) ×2 IMPLANT
MESH MARLEX PLUG MEDIUM (Mesh General) ×1 IMPLANT
NS IRRIG 1000ML POUR BTL (IV SOLUTION) ×2 IMPLANT
PACK MINOR (CUSTOM PROCEDURE TRAY) ×2 IMPLANT
PAD ARMBOARD 7.5X6 YLW CONV (MISCELLANEOUS) ×2 IMPLANT
PENCIL SMOKE EVACUATOR (MISCELLANEOUS) ×1 IMPLANT
SET BASIN LINEN APH (SET/KITS/TRAYS/PACK) ×2 IMPLANT
SOL PREP PROV IODINE SCRUB 4OZ (MISCELLANEOUS) ×2 IMPLANT
SUT MNCRL AB 4-0 PS2 18 (SUTURE) ×2 IMPLANT
SUT NOVA NAB GS-22 2 2-0 T-19 (SUTURE) ×5 IMPLANT
SUT SILK 3 0 (SUTURE)
SUT SILK 3-0 18XBRD TIE 12 (SUTURE) IMPLANT
SUT VIC AB 2-0 CT1 27 (SUTURE) ×2
SUT VIC AB 2-0 CT1 TAPERPNT 27 (SUTURE) ×1 IMPLANT
SUT VIC AB 3-0 SH 27 (SUTURE) ×4
SUT VIC AB 3-0 SH 27X BRD (SUTURE) ×1 IMPLANT
SUT VICRYL AB 2 0 TIES (SUTURE) IMPLANT
SUT VICRYL AB 3 0 TIES (SUTURE) IMPLANT

## 2020-11-05 NOTE — Anesthesia Preprocedure Evaluation (Addendum)
Anesthesia Evaluation  Patient identified by MRN, date of birth, ID band Patient awake    Reviewed: Allergy & Precautions, NPO status , Patient's Chart, lab work & pertinent test results  Airway Mallampati: II  TM Distance: >3 FB Neck ROM: Full    Dental  (+) Edentulous Upper, Edentulous Lower   Pulmonary pneumonia, former smoker,    Pulmonary exam normal breath sounds clear to auscultation       Cardiovascular Exercise Tolerance: Good + dysrhythmias  Rhythm:Irregular Rate:Bradycardia - Systolic murmurs, - Diastolic murmurs, - Friction Rub, - Carotid Bruit, - Peripheral Edema and - Systolic Click 25-Mar-2020 15:50:40 [facility] ROUTINE RECORD Sinus rhythm with Premature atrial complexes Possible Anterior infarct , age undetermined Abnormal ECG No significant change since last tracing Confirmed by Rinaldo Cloud ([id number]) on 03/25/2020 9:43:33 PM   Neuro/Psych PSYCHIATRIC DISORDERS Anxiety Depression    GI/Hepatic Neg liver ROS, GERD  Medicated,  Endo/Other  negative endocrine ROS  Renal/GU negative Renal ROS     Musculoskeletal negative musculoskeletal ROS (+)   Abdominal   Peds  Hematology  (+) anemia ,   Anesthesia Other Findings   Reproductive/Obstetrics negative OB ROS                          Anesthesia Physical Anesthesia Plan  ASA: 2  Anesthesia Plan: General   Post-op Pain Management:    Induction: Intravenous  PONV Risk Score and Plan: Ondansetron and Dexamethasone  Airway Management Planned: LMA and Oral ETT  Additional Equipment:   Intra-op Plan:   Post-operative Plan: Extubation in OR  Informed Consent: I have reviewed the patients History and Physical, chart, labs and discussed the procedure including the risks, benefits and alternatives for the proposed anesthesia with the patient or authorized representative who has indicated his/her  understanding and acceptance.     Dental advisory given  Plan Discussed with: CRNA and Surgeon  Anesthesia Plan Comments:        Anesthesia Quick Evaluation

## 2020-11-05 NOTE — Op Note (Signed)
Patient:  Mike Moran  DOB:  12/17/1955  MRN:  [id number]   Preop Diagnosis: Right inguinal hernia  Postop Diagnosis: Same  Procedure: Right inguinal herniorrhaphy with mesh  Surgeon: Franky Macho, MD  Anes: General  Indications: Patient is a 65 year old white male who presents with a symptomatic right inguinal hernia.  The risks and benefits of the procedure including bleeding, infection, mesh use, the possibility of recurrence of the hernia were fully explained to the patient, who gave informed consent.  Procedure note: The patient was placed in the supine position.  After general anesthesia was administered, the right groin region was prepped and draped using the usual sterile technique with Betadine.  Surgical site confirmation was performed.  Incision was made in the right groin region down to the external oblique aponeurosis.  The aponeurosis was incised to the external ring.  A Penrose drain was placed around the spermatic cord.  The vas deferens was noted within the spermatic cord.  The ilioinguinal nerve was identified and retracted inferiorly from the operative field.  An indirect hernia sac was found.  It was freed away from the spermatic cord up to the peritoneal reflection and inverted.  The medium size Bard PerFix plug was then inserted.  An onlay patch was placed along the floor of the inguinal canal and secured superiorly to the conjoined tendon and inferiorly to the shelving edge of Poupart's ligament using 2-0 Novafil interrupted sutures.  The internal ring was recreated using a 2-0 Novafil interrupted suture.  The external oblique aponeurosis was reapproximated using a 2-0 Vicryl running suture.  Eldridge Abrahams was placed over the mesh and in the subcutaneous tissue.  The subcutaneous layer was reapproximated using 3-0 Vicryl interrupted sutures.  The skin was closed using a 4-0 Monocryl subcuticular suture.  Dermabond was applied.  All tape and needle counts were correct at the  end of the procedure.  The patient was awakened and transferred to PACU in stable condition.  Complications:  None  EBL:  Minimal  Specimen:  None

## 2020-11-05 NOTE — Interval H&P Note (Signed)
History and Physical Interval Note:  11/05/2020 10:03 AM  Mike Moran  has presented today for surgery, with the diagnosis of Right inguinal hernia.  The various methods of treatment have been discussed with the patient and family. After consideration of risks, benefits and other options for treatment, the patient has consented to  Procedure(s): HERNIA REPAIR INGUINAL ADULT WITH MESH (Right) as a surgical intervention.  The patient's history has been reviewed, patient examined, no change in status, stable for surgery.  I have reviewed the patient's chart and labs.  Questions were answered to the patient's satisfaction.     Franky Macho

## 2020-11-05 NOTE — Transfer of Care (Signed)
Immediate Anesthesia Transfer of Care Note  Patient: Mike Moran  Procedure(s) Performed: HERNIA REPAIR INGUINAL ADULT WITH MESH (Right: Inguinal)  Patient Location: PACU  Anesthesia Type:General  Level of Consciousness: sedated, drowsy and responds to stimulation  Airway & Oxygen Therapy: Patient Spontanous Breathing and Patient connected to nasal cannula oxygen  Post-op Assessment: Report given to RN and Post -op Vital signs reviewed and stable  Post vital signs: Reviewed and stable  Last Vitals:  Vitals Value Taken Time  BP    Temp    Pulse 65 11/05/20 1240  Resp 9 11/05/20 1240  SpO2 100 % 11/05/20 1240  Vitals shown include unvalidated device data.  Last Pain:  Vitals:   11/05/20 0922  TempSrc: Oral  PainSc: 0-No pain         Complications: No notable events documented.

## 2020-11-05 NOTE — Anesthesia Procedure Notes (Signed)
Procedure Name: LMA Insertion Date/Time: 11/05/2020 11:41 AM Performed by: Moshe Salisbury, CRNA Pre-anesthesia Checklist: Patient identified, Patient being monitored, Emergency Drugs available, Timeout performed and Suction available Patient Re-evaluated:Patient Re-evaluated prior to induction Oxygen Delivery Method: Circle System Utilized Preoxygenation: Pre-oxygenation with 100% oxygen Induction Type: IV induction Ventilation: Mask ventilation without difficulty LMA: LMA inserted LMA Size: 4.0 Number of attempts: 1 Placement Confirmation: positive ETCO2 and breath sounds checked- equal and bilateral

## 2020-11-05 NOTE — Anesthesia Postprocedure Evaluation (Signed)
Anesthesia Post Note  Patient: Mike Moran  Procedure(s) Performed: HERNIA REPAIR INGUINAL ADULT WITH MESH (Right: Inguinal)  Patient location during evaluation: PACU Anesthesia Type: General Level of consciousness: awake and alert and oriented Pain management: satisfactory to patient (patient wants to go home and doesn't want iv pain meds) Vital Signs Assessment: post-procedure vital signs reviewed and stable Respiratory status: spontaneous breathing and respiratory function stable Cardiovascular status: blood pressure returned to baseline and stable Postop Assessment: no apparent nausea or vomiting Anesthetic complications: no   No notable events documented.   Last Vitals:  Vitals:   11/05/20 1315 11/05/20 1337  BP: 132/80 119/79  Pulse: 60 (!) 53  Resp: 15 18  Temp:  36.4 C  SpO2: 100% 100%    Last Pain:  Vitals:   11/05/20 1337  TempSrc: Oral  PainSc: 8                  Tayron Hunnell C Adron Geisel

## 2020-11-06 ENCOUNTER — Encounter (HOSPITAL_COMMUNITY): Payer: Self-pay | Admitting: General Surgery

## 2020-11-13 ENCOUNTER — Ambulatory Visit (INDEPENDENT_AMBULATORY_CARE_PROVIDER_SITE_OTHER): Payer: Medicare Other | Admitting: General Surgery

## 2020-11-13 ENCOUNTER — Encounter: Payer: Self-pay | Admitting: General Surgery

## 2020-11-13 ENCOUNTER — Other Ambulatory Visit: Payer: Self-pay

## 2020-11-13 VITALS — BP 96/61 | HR 68 | Temp 98.4°F | Resp 16 | Ht 72.0 in | Wt 124.0 lb

## 2020-11-13 DIAGNOSIS — Z09 Encounter for follow-up examination after completed treatment for conditions other than malignant neoplasm: Secondary | ICD-10-CM

## 2020-11-13 MED ORDER — OXYCODONE-ACETAMINOPHEN 7.5-325 MG PO TABS: 1.0000 | ORAL_TABLET | Freq: Four times a day (QID) | ORAL | 0 refills | Status: DC | PRN

## 2020-11-13 NOTE — Progress Notes (Signed)
Subjective:     Mike Moran  Here for follow-up, status post right inguinal herniorrhaphy with mesh.  Patient states he has only 1 pain pill left.  He is having mild incisional pain.  He is starting to increase his activity.  He has not done any heavy lifting. Objective:    BP 96/61   Pulse 68   Temp 98.4 F (36.9 C) (Other (Comment))   Resp 16   Ht 6' (1.829 m)   Wt 124 lb (56.2 kg)   SpO2 94%   BMI 16.82 kg/m   General:  alert, cooperative, and no distress  Right inguinal incision healing well.  No ecchymosis or seroma present.     Assessment:    Doing well postoperatively.    Plan:   I told him I would give him 1 more round of Percocet.  He should start supplementing ibuprofen for pain.  May increase activity as able.  Follow-up here as needed.

## 2020-11-22 ENCOUNTER — Other Ambulatory Visit (HOSPITAL_COMMUNITY): Payer: Self-pay | Admitting: Psychiatry

## 2021-01-03 ENCOUNTER — Other Ambulatory Visit (HOSPITAL_COMMUNITY): Payer: Self-pay | Admitting: Psychiatry

## 2021-01-04 ENCOUNTER — Emergency Department (HOSPITAL_COMMUNITY): Payer: Medicare Other | Admitting: Anesthesiology

## 2021-01-04 ENCOUNTER — Encounter (HOSPITAL_COMMUNITY): Payer: Self-pay | Admitting: General Surgery

## 2021-01-04 ENCOUNTER — Encounter (HOSPITAL_COMMUNITY)
Admission: EM | Disposition: A | Discharge status: Home / Self Care | Payer: Self-pay | Source: Home / Self Care | Attending: General Surgery

## 2021-01-04 ENCOUNTER — Emergency Department (HOSPITAL_COMMUNITY): Payer: Medicare Other

## 2021-01-04 ENCOUNTER — Inpatient Hospital Stay (HOSPITAL_COMMUNITY)
Admission: EM | Admit: 2021-01-04 | Discharge: 2021-01-07 | DRG: 340 | Disposition: A | Discharge status: Home / Self Care | Payer: Medicare Other | Attending: General Surgery | Admitting: General Surgery

## 2021-01-04 ENCOUNTER — Other Ambulatory Visit: Payer: Self-pay

## 2021-01-04 DIAGNOSIS — K3532 Acute appendicitis with perforation and localized peritonitis, without abscess: Secondary | ICD-10-CM

## 2021-01-04 DIAGNOSIS — Z01818 Encounter for other preprocedural examination: Secondary | ICD-10-CM | POA: Diagnosis not present

## 2021-01-04 DIAGNOSIS — R0689 Other abnormalities of breathing: Secondary | ICD-10-CM | POA: Diagnosis not present

## 2021-01-04 DIAGNOSIS — K353 Acute appendicitis with localized peritonitis, without perforation or gangrene: Secondary | ICD-10-CM | POA: Diagnosis not present

## 2021-01-04 DIAGNOSIS — Z20822 Contact with and (suspected) exposure to covid-19: Secondary | ICD-10-CM | POA: Diagnosis present

## 2021-01-04 DIAGNOSIS — Z885 Allergy status to narcotic agent status: Secondary | ICD-10-CM | POA: Diagnosis not present

## 2021-01-04 DIAGNOSIS — Z7982 Long term (current) use of aspirin: Secondary | ICD-10-CM

## 2021-01-04 DIAGNOSIS — Z87891 Personal history of nicotine dependence: Secondary | ICD-10-CM

## 2021-01-04 DIAGNOSIS — R109 Unspecified abdominal pain: Secondary | ICD-10-CM | POA: Diagnosis not present

## 2021-01-04 DIAGNOSIS — Z888 Allergy status to other drugs, medicaments and biological substances status: Secondary | ICD-10-CM

## 2021-01-04 DIAGNOSIS — F418 Other specified anxiety disorders: Secondary | ICD-10-CM | POA: Diagnosis not present

## 2021-01-04 DIAGNOSIS — R1084 Generalized abdominal pain: Secondary | ICD-10-CM | POA: Diagnosis not present

## 2021-01-04 DIAGNOSIS — I440 Atrioventricular block, first degree: Secondary | ICD-10-CM | POA: Diagnosis not present

## 2021-01-04 DIAGNOSIS — R000 Tachycardia, unspecified: Secondary | ICD-10-CM | POA: Diagnosis not present

## 2021-01-04 DIAGNOSIS — R1030 Lower abdominal pain, unspecified: Secondary | ICD-10-CM | POA: Diagnosis not present

## 2021-01-04 HISTORY — DX: Acute appendicitis with perforation, localized peritonitis, and gangrene, without abscess: K35.32

## 2021-01-04 HISTORY — PX: LAPAROSCOPIC APPENDECTOMY: SHX408

## 2021-01-04 LAB — CBC WITH DIFFERENTIAL/PLATELET
Abs Immature Granulocytes: 0.12 10*3/uL — ABNORMAL HIGH (ref 0.00–0.07)
Basophils Absolute: 0 10*3/uL (ref 0.0–0.1)
Basophils Relative: 0 %
Eosinophils Absolute: 0 10*3/uL (ref 0.0–0.5)
Eosinophils Relative: 0 %
HCT: 36.5 % — ABNORMAL LOW (ref 39.0–52.0)
Hemoglobin: 12.5 g/dL — ABNORMAL LOW (ref 13.0–17.0)
Immature Granulocytes: 1 %
Lymphocytes Relative: 4 %
Lymphs Abs: 0.9 10*3/uL (ref 0.7–4.0)
MCH: 31.8 pg (ref 26.0–34.0)
MCHC: 34.2 g/dL (ref 30.0–36.0)
MCV: 92.9 fL (ref 80.0–100.0)
Monocytes Absolute: 1.3 10*3/uL — ABNORMAL HIGH (ref 0.1–1.0)
Monocytes Relative: 6 %
Neutro Abs: 21.7 10*3/uL — ABNORMAL HIGH (ref 1.7–7.7)
Neutrophils Relative %: 89 %
Platelets: 323 10*3/uL (ref 150–400)
RBC: 3.93 MIL/uL — ABNORMAL LOW (ref 4.22–5.81)
RDW: 13 % (ref 11.5–15.5)
WBC: 24.1 10*3/uL — ABNORMAL HIGH (ref 4.0–10.5)
nRBC: 0 % (ref 0.0–0.2)

## 2021-01-04 LAB — RESP PANEL BY RT-PCR (FLU A&B, COVID) ARPGX2
Influenza A by PCR: NEGATIVE
Influenza B by PCR: NEGATIVE
SARS Coronavirus 2 by RT PCR: NEGATIVE

## 2021-01-04 LAB — URINALYSIS, ROUTINE W REFLEX MICROSCOPIC
Bilirubin Urine: NEGATIVE
Glucose, UA: NEGATIVE mg/dL
Hgb urine dipstick: NEGATIVE
Ketones, ur: NEGATIVE mg/dL
Leukocytes,Ua: NEGATIVE
Nitrite: NEGATIVE
Protein, ur: 30 mg/dL — AB
Specific Gravity, Urine: 1.015 (ref 1.005–1.030)
pH: 6.5 (ref 5.0–8.0)

## 2021-01-04 LAB — BASIC METABOLIC PANEL
Anion gap: 10 (ref 5–15)
BUN: 17 mg/dL (ref 8–23)
CO2: 27 mmol/L (ref 22–32)
Calcium: 9.3 mg/dL (ref 8.9–10.3)
Chloride: 96 mmol/L — ABNORMAL LOW (ref 98–111)
Creatinine, Ser: 0.58 mg/dL — ABNORMAL LOW (ref 0.61–1.24)
GFR, Estimated: >60 mL/min (ref >60)
Glucose, Bld: 147 mg/dL — ABNORMAL HIGH (ref 70–99)
Potassium: 3.4 mmol/L — ABNORMAL LOW (ref 3.5–5.1)
Sodium: 133 mmol/L — ABNORMAL LOW (ref 135–145)

## 2021-01-04 LAB — LACTIC ACID, PLASMA: Lactic Acid, Venous: 1.3 mmol/L (ref 0.5–1.9)

## 2021-01-04 LAB — URINALYSIS, MICROSCOPIC (REFLEX)

## 2021-01-04 SURGERY — APPENDECTOMY, LAPAROSCOPIC
Anesthesia: General

## 2021-01-04 MED ORDER — SUCCINYLCHOLINE CHLORIDE 200 MG/10ML IV SOSY
PREFILLED_SYRINGE | INTRAVENOUS | Status: DC | PRN
  Administered 2021-01-04: 120 mg via INTRAVENOUS

## 2021-01-04 MED ORDER — CHLORHEXIDINE GLUCONATE CLOTH 2 % EX PADS: 6.0000 | MEDICATED_PAD | Freq: Once | CUTANEOUS | Status: DC

## 2021-01-04 MED ORDER — ROCURONIUM BROMIDE 10 MG/ML (PF) SYRINGE
PREFILLED_SYRINGE | INTRAVENOUS | Status: DC | PRN
  Administered 2021-01-04: 40 mg via INTRAVENOUS

## 2021-01-04 MED ORDER — SODIUM CHLORIDE 0.9 % IR SOLN
Status: DC | PRN
  Administered 2021-01-04: 1000 mL

## 2021-01-04 MED ORDER — ENOXAPARIN SODIUM 40 MG/0.4ML IJ SOSY
40.0000 mg | PREFILLED_SYRINGE | INTRAMUSCULAR | Status: DC
  Administered 2021-01-05: 09:00:00 40 mg via SUBCUTANEOUS
  Filled 2021-01-04: qty 0.4

## 2021-01-04 MED ORDER — BUPIVACAINE LIPOSOME 1.3 % IJ SUSP
INTRAMUSCULAR | Status: AC
  Filled 2021-01-04: qty 20

## 2021-01-04 MED ORDER — DIPHENHYDRAMINE HCL 12.5 MG/5ML PO ELIX
12.5000 mg | ORAL_SOLUTION | Freq: Four times a day (QID) | ORAL | Status: DC | PRN
  Filled 2021-01-04: qty 5

## 2021-01-04 MED ORDER — HYDROMORPHONE HCL 1 MG/ML IJ SOLN: 1.0000 mg | INTRAMUSCULAR | Status: DC | PRN

## 2021-01-04 MED ORDER — MIDAZOLAM HCL 2 MG/2ML IJ SOLN
INTRAMUSCULAR | Status: AC
  Filled 2021-01-04: qty 2

## 2021-01-04 MED ORDER — ACETAMINOPHEN 325 MG PO TABS: 650.0000 mg | ORAL_TABLET | Freq: Four times a day (QID) | ORAL | Status: DC | PRN

## 2021-01-04 MED ORDER — ACETAMINOPHEN 650 MG RE SUPP
650.0000 mg | Freq: Four times a day (QID) | RECTAL | Status: DC | PRN
  Filled 2021-01-04: qty 1

## 2021-01-04 MED ORDER — KETOROLAC TROMETHAMINE 15 MG/ML IJ SOLN: 15.0000 mg | Freq: Four times a day (QID) | INTRAMUSCULAR | Status: DC | PRN

## 2021-01-04 MED ORDER — ONDANSETRON 4 MG PO TBDP: 4.0000 mg | ORAL_TABLET | Freq: Four times a day (QID) | ORAL | Status: DC | PRN

## 2021-01-04 MED ORDER — MIDAZOLAM HCL 5 MG/5ML IJ SOLN
INTRAMUSCULAR | Status: DC | PRN
  Administered 2021-01-04: 2 mg via INTRAVENOUS

## 2021-01-04 MED ORDER — PROPOFOL 10 MG/ML IV BOLUS
INTRAVENOUS | Status: DC | PRN
  Administered 2021-01-04: 100 mg via INTRAVENOUS

## 2021-01-04 MED ORDER — ONDANSETRON HCL 4 MG/2ML IJ SOLN
4.0000 mg | Freq: Four times a day (QID) | INTRAMUSCULAR | Status: DC | PRN
  Administered 2021-01-06: 4 mg via INTRAVENOUS
  Filled 2021-01-04: qty 2

## 2021-01-04 MED ORDER — ONDANSETRON HCL 4 MG/2ML IJ SOLN
4.0000 mg | Freq: Once | INTRAMUSCULAR | Status: AC
  Administered 2021-01-04: 02:00:00 4 mg via INTRAVENOUS
  Filled 2021-01-04: qty 2

## 2021-01-04 MED ORDER — KETOROLAC TROMETHAMINE 15 MG/ML IJ SOLN
INTRAMUSCULAR | Status: AC
  Filled 2021-01-04: qty 1

## 2021-01-04 MED ORDER — LIDOCAINE HCL (CARDIAC) PF 100 MG/5ML IV SOSY
PREFILLED_SYRINGE | INTRAVENOUS | Status: DC | PRN
  Administered 2021-01-04: 60 mg via INTRATRACHEAL

## 2021-01-04 MED ORDER — HYDROXYZINE HCL 25 MG PO TABS
25.0000 mg | ORAL_TABLET | Freq: Four times a day (QID) | ORAL | Status: DC | PRN
  Administered 2021-01-04: 22:00:00 25 mg via ORAL
  Filled 2021-01-04 (×2): qty 1

## 2021-01-04 MED ORDER — HYDROMORPHONE HCL 1 MG/ML IJ SOLN
1.0000 mg | INTRAMUSCULAR | Status: DC | PRN
  Administered 2021-01-04 – 2021-01-07 (×17): 1 mg via INTRAVENOUS
  Filled 2021-01-04 (×18): qty 1

## 2021-01-04 MED ORDER — SUGAMMADEX SODIUM 200 MG/2ML IV SOLN
INTRAVENOUS | Status: DC | PRN
  Administered 2021-01-04: 263.2 mg via INTRAVENOUS

## 2021-01-04 MED ORDER — TRAZODONE HCL 50 MG PO TABS
50.0000 mg | ORAL_TABLET | Freq: Every evening | ORAL | Status: DC | PRN
  Administered 2021-01-04: 22:00:00 50 mg via ORAL
  Filled 2021-01-04 (×2): qty 1

## 2021-01-04 MED ORDER — ONDANSETRON HCL 4 MG/2ML IJ SOLN
INTRAMUSCULAR | Status: DC | PRN
  Administered 2021-01-04: 4 mg via INTRAVENOUS

## 2021-01-04 MED ORDER — SODIUM CHLORIDE 0.9 % IV SOLN
2.0000 g | Freq: Once | INTRAVENOUS | Status: AC
  Administered 2021-01-04: 05:00:00 2 g via INTRAVENOUS
  Filled 2021-01-04: qty 20

## 2021-01-04 MED ORDER — PIPERACILLIN-TAZOBACTAM 3.375 G IVPB
3.3750 g | Freq: Three times a day (TID) | INTRAVENOUS | Status: DC
  Administered 2021-01-04 – 2021-01-07 (×9): 3.375 g via INTRAVENOUS
  Filled 2021-01-04 (×13): qty 50

## 2021-01-04 MED ORDER — OXYCODONE-ACETAMINOPHEN 5-325 MG PO TABS
1.0000 | ORAL_TABLET | ORAL | Status: DC | PRN
  Filled 2021-01-04: qty 1

## 2021-01-04 MED ORDER — LIDOCAINE HCL (PF) 2 % IJ SOLN
INTRAMUSCULAR | Status: AC
  Filled 2021-01-04: qty 5

## 2021-01-04 MED ORDER — SIMETHICONE 80 MG PO CHEW
40.0000 mg | CHEWABLE_TABLET | Freq: Four times a day (QID) | ORAL | Status: DC | PRN
  Filled 2021-01-04: qty 1

## 2021-01-04 MED ORDER — FENTANYL CITRATE (PF) 100 MCG/2ML IJ SOLN
INTRAMUSCULAR | Status: AC
  Filled 2021-01-04: qty 4

## 2021-01-04 MED ORDER — HYDROMORPHONE HCL 1 MG/ML IJ SOLN
1.0000 mg | Freq: Once | INTRAMUSCULAR | Status: AC
  Administered 2021-01-04: 02:00:00 1 mg via INTRAVENOUS
  Filled 2021-01-04: qty 1

## 2021-01-04 MED ORDER — LACTATED RINGERS IV SOLN: INTRAVENOUS | Status: DC | PRN

## 2021-01-04 MED ORDER — POVIDONE-IODINE 10 % EX OINT
TOPICAL_OINTMENT | CUTANEOUS | Status: AC
  Filled 2021-01-04: qty 1

## 2021-01-04 MED ORDER — IOHEXOL 300 MG/ML  SOLN
100.0000 mL | Freq: Once | INTRAMUSCULAR | Status: AC | PRN
  Administered 2021-01-04: 04:00:00 100 mL via INTRAVENOUS

## 2021-01-04 MED ORDER — KETOROLAC TROMETHAMINE 15 MG/ML IJ SOLN
15.0000 mg | Freq: Four times a day (QID) | INTRAMUSCULAR | Status: AC
  Administered 2021-01-04: 11:00:00 15 mg via INTRAVENOUS

## 2021-01-04 MED ORDER — SODIUM CHLORIDE 0.9 % IV SOLN: INTRAVENOUS | Status: DC

## 2021-01-04 MED ORDER — FENTANYL CITRATE (PF) 100 MCG/2ML IJ SOLN
INTRAMUSCULAR | Status: AC
  Filled 2021-01-04: qty 2

## 2021-01-04 MED ORDER — ONDANSETRON HCL 4 MG/2ML IJ SOLN: 4.0000 mg | Freq: Once | INTRAMUSCULAR | Status: DC | PRN

## 2021-01-04 MED ORDER — BUPIVACAINE LIPOSOME 1.3 % IJ SUSP
INTRAMUSCULAR | Status: DC | PRN
  Administered 2021-01-04: 20 mL

## 2021-01-04 MED ORDER — CHLORHEXIDINE GLUCONATE CLOTH 2 % EX PADS: 6.0000 | MEDICATED_PAD | Freq: Every day | CUTANEOUS | Status: DC

## 2021-01-04 MED ORDER — PHENYLEPHRINE 40 MCG/ML (10ML) SYRINGE FOR IV PUSH (FOR BLOOD PRESSURE SUPPORT)
PREFILLED_SYRINGE | INTRAVENOUS | Status: DC | PRN
  Administered 2021-01-04: 120 ug via INTRAVENOUS

## 2021-01-04 MED ORDER — ESMOLOL HCL 100 MG/10ML IV SOLN
INTRAVENOUS | Status: DC | PRN
  Administered 2021-01-04: 30 mg via INTRAVENOUS
  Administered 2021-01-04 (×2): 20 mg via INTRAVENOUS

## 2021-01-04 MED ORDER — RISPERIDONE 0.5 MG PO TABS
0.5000 mg | ORAL_TABLET | Freq: Every day | ORAL | Status: DC
  Administered 2021-01-04 – 2021-01-06 (×3): 0.5 mg via ORAL
  Filled 2021-01-04 (×4): qty 1

## 2021-01-04 MED ORDER — METRONIDAZOLE 500 MG/100ML IV SOLN
500.0000 mg | Freq: Once | INTRAVENOUS | Status: AC
  Administered 2021-01-04: 06:00:00 500 mg via INTRAVENOUS
  Filled 2021-01-04: qty 100

## 2021-01-04 MED ORDER — DEXAMETHASONE SODIUM PHOSPHATE 10 MG/ML IJ SOLN
INTRAMUSCULAR | Status: DC | PRN
  Administered 2021-01-04: 10 mg via INTRAVENOUS

## 2021-01-04 MED ORDER — HYDROMORPHONE HCL 1 MG/ML IJ SOLN: 0.2500 mg | INTRAMUSCULAR | Status: DC | PRN

## 2021-01-04 MED ORDER — ROCURONIUM BROMIDE 10 MG/ML (PF) SYRINGE
PREFILLED_SYRINGE | INTRAVENOUS | Status: AC
  Filled 2021-01-04: qty 10

## 2021-01-04 MED ORDER — HYDROMORPHONE HCL 1 MG/ML IJ SOLN
1.0000 mg | Freq: Once | INTRAMUSCULAR | Status: AC
Start: 2021-01-04 — End: 2021-01-04
  Administered 2021-01-04: 05:00:00 1 mg via INTRAVENOUS
  Filled 2021-01-04: qty 1

## 2021-01-04 MED ORDER — PROPOFOL 10 MG/ML IV BOLUS
INTRAVENOUS | Status: AC
  Filled 2021-01-04: qty 20

## 2021-01-04 MED ORDER — DULOXETINE HCL 60 MG PO CPEP
60.0000 mg | ORAL_CAPSULE | Freq: Every day | ORAL | Status: DC
  Administered 2021-01-04 – 2021-01-07 (×4): 60 mg via ORAL
  Filled 2021-01-04 (×6): qty 1

## 2021-01-04 MED ORDER — FENTANYL CITRATE (PF) 100 MCG/2ML IJ SOLN
INTRAMUSCULAR | Status: DC | PRN
  Administered 2021-01-04 (×6): 50 ug via INTRAVENOUS

## 2021-01-04 MED ORDER — DIPHENHYDRAMINE HCL 50 MG/ML IJ SOLN: 12.5000 mg | Freq: Four times a day (QID) | INTRAMUSCULAR | Status: DC | PRN

## 2021-01-04 MED ORDER — ONDANSETRON HCL 4 MG/2ML IJ SOLN
INTRAMUSCULAR | Status: AC
  Filled 2021-01-04: qty 2

## 2021-01-04 SURGICAL SUPPLY — 59 items
ADH SKN CLS APL DERMABOND .7 (GAUZE/BANDAGES/DRESSINGS) ×1
APL PRP STRL LF DISP 70% ISPRP (MISCELLANEOUS) ×1
BAG RETRIEVAL 10 (BASKET) ×1
CHLORAPREP W/TINT 26 (MISCELLANEOUS) ×2 IMPLANT
CLOTH BEACON ORANGE TIMEOUT ST (SAFETY) ×2 IMPLANT
COVER LIGHT HANDLE STERIS (MISCELLANEOUS) ×4 IMPLANT
CUTTER FLEX LINEAR 45M (STAPLE) ×2 IMPLANT
DERMABOND ADVANCED (GAUZE/BANDAGES/DRESSINGS) ×1
DERMABOND ADVANCED .7 DNX12 (GAUZE/BANDAGES/DRESSINGS) ×1 IMPLANT
DRSG TEGADERM 2-3/8X2-3/4 SM (GAUZE/BANDAGES/DRESSINGS) ×3 IMPLANT
ELECT REM PT RETURN 9FT ADLT (ELECTROSURGICAL) ×2
ELECTRODE REM PT RTRN 9FT ADLT (ELECTROSURGICAL) ×1 IMPLANT
EVACUATOR DRAINAGE 10X20 100CC (DRAIN) IMPLANT
EVACUATOR SILICONE 100CC (DRAIN) ×2
GAUZE 4X4 16PLY ~~LOC~~+RFID DBL (SPONGE) ×2 IMPLANT
GLOVE EUDERMIC 6.5 POWDERFREE (GLOVE) ×1 IMPLANT
GLOVE ORTHOPEDIC STR SZ6.5 (GLOVE) ×1 IMPLANT
GLOVE SURG GAMMEX LF SZ7.5 (GLOVE) ×1 IMPLANT
GLOVE SURG POLYISO LF SZ7.5 (GLOVE) ×2 IMPLANT
GLOVE SURG UNDER POLY LF SZ7 (GLOVE) ×6 IMPLANT
GOWN STRL REUS W/TWL LRG LVL3 (GOWN DISPOSABLE) ×4 IMPLANT
INST SET LAPROSCOPIC AP (KITS) ×2 IMPLANT
KIT TURNOVER KIT A (KITS) ×2 IMPLANT
MANIFOLD NEPTUNE II (INSTRUMENTS) ×2 IMPLANT
NDL HYPO 18GX1.5 BLUNT FILL (NEEDLE) ×1 IMPLANT
NDL HYPO 21X1.5 SAFETY (NEEDLE) ×1 IMPLANT
NDL INSUFFLATION 14GA 120MM (NEEDLE) ×1 IMPLANT
NEEDLE HYPO 18GX1.5 BLUNT FILL (NEEDLE) ×2 IMPLANT
NEEDLE HYPO 21X1.5 SAFETY (NEEDLE) ×2 IMPLANT
NEEDLE INSUFFLATION 14GA 120MM (NEEDLE) ×2 IMPLANT
NS IRRIG 1000ML POUR BTL (IV SOLUTION) ×2 IMPLANT
PACK LAP CHOLE LZT030E (CUSTOM PROCEDURE TRAY) ×2 IMPLANT
PAD ARMBOARD 7.5X6 YLW CONV (MISCELLANEOUS) ×2 IMPLANT
PENCIL SMOKE EVACUATOR COATED (MISCELLANEOUS) IMPLANT
RELOAD 45 VASCULAR/THIN (ENDOMECHANICALS) ×2 IMPLANT
RELOAD STAPLE 45 2.5 WHT GRN (ENDOMECHANICALS) IMPLANT
RELOAD STAPLE 45 3.5 BLU ETS (ENDOMECHANICALS) IMPLANT
RELOAD STAPLE TA45 3.5 REG BLU (ENDOMECHANICALS) IMPLANT
SET BASIN LINEN APH (SET/KITS/TRAYS/PACK) ×2 IMPLANT
SET TUBE IRRIG SUCTION NO TIP (IRRIGATION / IRRIGATOR) ×2 IMPLANT
SET TUBE SMOKE EVAC HIGH FLOW (TUBING) ×2 IMPLANT
SHEARS HARMONIC ACE PLUS 36CM (ENDOMECHANICALS) ×2 IMPLANT
SPONGE DRAIN TRACH 4X4 STRL 2S (GAUZE/BANDAGES/DRESSINGS) ×1 IMPLANT
SPONGE GAUZE 2X2 8PLY STRL LF (GAUZE/BANDAGES/DRESSINGS) ×3 IMPLANT
STAPLER VISISTAT (STAPLE) ×1 IMPLANT
SUT ETHILON 3 0 FSL (SUTURE) ×1 IMPLANT
SUT MNCRL AB 4-0 PS2 18 (SUTURE) ×4 IMPLANT
SUT VICRYL 0 UR6 27IN ABS (SUTURE) ×2 IMPLANT
SYR 20ML LL LF (SYRINGE) ×4 IMPLANT
SYS BAG RETRIEVAL 10MM (BASKET) ×1
SYSTEM BAG RETRIEVAL 10MM (BASKET) ×1 IMPLANT
TAPE HYPAFIX 4 X30'CHARGABLE (GAUZE/BANDAGES/DRESSINGS) ×1 IMPLANT
TRAY FOLEY W/BAG SLVR 16FR (SET/KITS/TRAYS/PACK) ×2
TRAY FOLEY W/BAG SLVR 16FR ST (SET/KITS/TRAYS/PACK) ×1 IMPLANT
TROCAR ENDO BLADELESS 11MM (ENDOMECHANICALS) ×2 IMPLANT
TROCAR ENDO BLADELESS 12MM (ENDOMECHANICALS) ×2 IMPLANT
TROCAR XCEL NON-BLD 5MMX100MML (ENDOMECHANICALS) ×2 IMPLANT
TUBE CONNECTING 12X1/4 (SUCTIONS) ×2 IMPLANT
WARMER LAPAROSCOPE (MISCELLANEOUS) ×2 IMPLANT

## 2021-01-04 NOTE — Transfer of Care (Signed)
Immediate Anesthesia Transfer of Care Note  Patient: Mike Moran  Procedure(s) Performed: APPENDECTOMY LAPAROSCOPIC  Patient Location: PACU  Anesthesia Type:General  Level of Consciousness: sedated, drowsy and responds to stimulation  Airway & Oxygen Therapy: Patient Spontanous Breathing and Patient connected to face mask oxygen  Post-op Assessment: Post -op Vital signs reviewed and stable  Post vital signs: Reviewed and stable  Last Vitals:  Vitals Value Taken Time  BP 129/87 01/04/21 1045  Temp 37.4 C 01/04/21 1045  Pulse 112 01/04/21 1052  Resp 12 01/04/21 1052  SpO2 100 % 01/04/21 1052  Vitals shown include unvalidated device data.  Last Pain:  Vitals:   01/04/21 1045  TempSrc:   PainSc: Asleep         Complications: No notable events documented.

## 2021-01-04 NOTE — Anesthesia Postprocedure Evaluation (Signed)
Anesthesia Post Note  Patient: Shara Blazing  Procedure(s) Performed: APPENDECTOMY LAPAROSCOPIC  Patient location during evaluation: PACU Anesthesia Type: General Level of consciousness: awake and alert, oriented and sedated Pain management: pain level controlled Vital Signs Assessment: post-procedure vital signs reviewed and stable Respiratory status: spontaneous breathing, nonlabored ventilation, respiratory function stable and patient connected to nasal cannula oxygen Cardiovascular status: blood pressure returned to baseline and stable Postop Assessment: no apparent nausea or vomiting Anesthetic complications: no   No notable events documented.   Last Vitals:  Vitals:   01/04/21 1215 01/04/21 1300  BP: 101/70 103/71  Pulse: 73 82  Resp: 16 18  Temp:  36.9 C  SpO2: 96% 96%    Last Pain:  Vitals:   01/04/21 1300  TempSrc: Oral  PainSc:                  Melvie Paglia C Dartanion Teo

## 2021-01-04 NOTE — H&P (Signed)
Reason for Consult: Acute appendicitis Referring Physician: Dr. Wilhemena Durie is an 65 y.o. male.  HPI: Patient is a 65 year old white male who presented to the emergency room with a 4-day history of worsening lower abdominal pain.  CT scan of the abdomen reveals acute appendicitis.  He also has twisting of the mesentery consistent with a partial small bowel obstruction.  He is status post a right inguinal herniorrhaphy by myself in September of this year.  He has decreased appetite.  He has been having episodes of emesis.  Past Medical History:  Diagnosis Date   Anxiety    Depression     Past Surgical History:  Procedure Laterality Date   INGUINAL HERNIA REPAIR Right 11/05/2020   Procedure: HERNIA REPAIR INGUINAL ADULT WITH MESH;  Surgeon: Franky Macho, MD;  Location: [facility];  Service: General;  Laterality: Right;    No family history on file.  Social History:  reports that he has quit smoking. He has never used smokeless tobacco. He reports current alcohol use. He reports that he does not use drugs.  Allergies:  Allergies  Allergen Reactions   Codeine Nausea And Vomiting   Lexapro [Escitalopram Oxalate] Other (See Comments)    lethargic    Medications: Prior to Admission: (Not in a hospital admission)   Results for orders placed or performed during the hospital encounter of 01/04/21 (from the past 48 hour(s))  CBC with Differential/Platelet     Status: Abnormal   Collection Time: 01/04/21  2:29 AM  Result Value Ref Range   WBC 24.1 (H) 4.0 - 10.5 K/uL   RBC 3.93 (L) 4.22 - 5.81 MIL/uL   Hemoglobin 12.5 (L) 13.0 - 17.0 g/dL   HCT 29.5 (L) 62.1 - 30.8 %   MCV 92.9 80.0 - 100.0 fL   MCH 31.8 26.0 - 34.0 pg   MCHC 34.2 30.0 - 36.0 g/dL   RDW 65.7 84.6 - 96.2 %   Platelets 323 150 - 400 K/uL   nRBC 0.0 0.0 - 0.2 %   Neutrophils Relative % 89 %   Neutro Abs 21.7 (H) 1.7 - 7.7 K/uL   Lymphocytes Relative 4 %   Lymphs Abs 0.9 0.7 - 4.0 K/uL   Monocytes Relative 6  %   Monocytes Absolute 1.3 (H) 0.1 - 1.0 K/uL   Eosinophils Relative 0 %   Eosinophils Absolute 0.0 0.0 - 0.5 K/uL   Basophils Relative 0 %   Basophils Absolute 0.0 0.0 - 0.1 K/uL   Immature Granulocytes 1 %   Abs Immature Granulocytes 0.12 (H) 0.00 - 0.07 K/uL    Comment: Performed at [facility], [address] [place], [place] [address]  Basic metabolic panel     Status: Abnormal   Collection Time: 01/04/21  2:29 AM  Result Value Ref Range   Sodium 133 (L) 135 - 145 mmol/L   Potassium 3.4 (L) 3.5 - 5.1 mmol/L   Chloride 96 (L) 98 - 111 mmol/L   CO2 27 22 - 32 mmol/L   Glucose, Bld 147 (H) 70 - 99 mg/dL    Comment: Glucose reference range applies only to samples taken after fasting for at least 8 hours.   BUN 17 8 - 23 mg/dL   Creatinine, Ser 1.32 (L) 0.61 - 1.24 mg/dL   Calcium 9.3 8.9 - 44.0 mg/dL   GFR, Estimated >10 >27 mL/min    Comment: (NOTE) Calculated using the CKD-EPI Creatinine Equation (2021)    Anion gap 10 5 -  15    Comment: Performed at [facility], [address], [place], [place] [address]  Lactic acid, plasma     Status: None   Collection Time: 01/04/21  2:29 AM  Result Value Ref Range   Lactic Acid, Venous 1.3 0.5 - 1.9 mmol/L    Comment: Performed at [facility], [address], [place], [place] [address]  Urinalysis, Routine w reflex microscopic Urine, Clean Catch     Status: Abnormal   Collection Time: 01/04/21  4:09 AM  Result Value Ref Range   Color, Urine YELLOW YELLOW   APPearance CLEAR CLEAR   Specific Gravity, Urine 1.015 1.005 - 1.030   pH 6.5 5.0 - 8.0   Glucose, UA NEGATIVE NEGATIVE mg/dL   Hgb urine dipstick NEGATIVE NEGATIVE   Bilirubin Urine NEGATIVE NEGATIVE   Ketones, ur NEGATIVE NEGATIVE mg/dL   Protein, ur 30 (A) NEGATIVE mg/dL   Nitrite NEGATIVE NEGATIVE   Leukocytes,Ua NEGATIVE NEGATIVE    Comment: Performed at [facility], [address] [place], [place] [address]  Urinalysis, Microscopic (reflex)     Status: Abnormal    Collection Time: 01/04/21  4:09 AM  Result Value Ref Range   RBC / HPF 0-5 0 - 5 RBC/hpf   WBC, UA 0-5 0 - 5 WBC/hpf   Bacteria, UA RARE (A) NONE SEEN   Squamous Epithelial / LPF 0-5 0 - 5    Comment: Performed at [facility], [address], [place], [place] [address]  Resp Panel by RT-PCR (Flu A&B, Covid) Nasopharyngeal Swab     Status: None   Collection Time: 01/04/21  5:40 AM   Specimen: Nasopharyngeal Swab; Nasopharyngeal(NP) swabs in vial transport medium  Result Value Ref Range   SARS Coronavirus 2 by RT PCR NEGATIVE NEGATIVE    Comment: (NOTE) SARS-CoV-2 target nucleic acids are NOT DETECTED.  The SARS-CoV-2 RNA is generally detectable in upper respiratory specimens during the acute phase of infection. The lowest concentration of SARS-CoV-2 viral copies this assay can detect is 138 copies/mL. A negative result does not preclude SARS-Cov-2 infection and should not be used as the sole basis for treatment or other patient management decisions. A negative result may occur with  improper specimen collection/handling, submission of specimen other than nasopharyngeal swab, presence of viral mutation(s) within the areas targeted by this assay, and inadequate number of viral copies(<138 copies/mL). A negative result must be combined with clinical observations, patient history, and epidemiological information. The expected result is Negative.  Fact Sheet for Patients:  BloggerCourse.com  Fact Sheet for Healthcare Providers:  SeriousBroker.it  This test is no t yet approved or cleared by the [place] FDA and  has been authorized for detection and/or diagnosis of SARS-CoV-2 by FDA under an Emergency Use Authorization (EUA). This EUA will remain  in effect (meaning this test can be used) for the duration of the COVID-19 declaration under Section 564(b)(1) of the Act, 21 U.S.C.section 360bbb-3(b)(1), unless the authorization is  terminated  or revoked sooner.       Influenza A by PCR NEGATIVE NEGATIVE   Influenza B by PCR NEGATIVE NEGATIVE    Comment: (NOTE) The Xpert Xpress SARS-CoV-2/FLU/RSV plus assay is intended as an aid in the diagnosis of influenza from Nasopharyngeal swab specimens and should not be used as a sole basis for treatment. Nasal washings and aspirates are unacceptable for Xpert Xpress SARS-CoV-2/FLU/RSV testing.  Fact Sheet for Patients: BloggerCourse.com  Fact Sheet for Healthcare Providers: SeriousBroker.it  This test is not yet  approved or cleared by the [place] and has been authorized for detection and/or diagnosis of SARS-CoV-2 by FDA under an Emergency Use Authorization (EUA). This EUA will remain in effect (meaning this test can be used) for the duration of the COVID-19 declaration under Section 564(b)(1) of the Act, 21 U.S.C. section 360bbb-3(b)(1), unless the authorization is terminated or revoked.  Performed at [facility], [address] [place], [place] [address]     CT ABDOMEN PELVIS W CONTRAST  Result Date: 01/04/2021 CLINICAL DATA:  Abdominal pain EXAM: CT ABDOMEN AND PELVIS WITH CONTRAST TECHNIQUE: Multidetector CT imaging of the abdomen and pelvis was performed using the standard protocol following bolus administration of intravenous contrast. CONTRAST:  OMNIPAQUE IOHEXOL 300 MG/ML  SOLN COMPARISON:  None similar FINDINGS: Lower chest:  Coronary calcification.  Mild atelectasis. Hepatobiliary: No focal liver abnormality.Cholelithiasis. No evidence of acute cholecystitis. Pancreas: Generalized atrophy Spleen: Unremarkable. Adrenals/Urinary Tract: 5.4 cm right adrenal mass with central fat, fluid, and calcific densities. This is stable and benign since at least 2010 abdominal ultrasound. Tiny low densities in the bilateral renal cortex, too small for accurate characterization. No hydronephrosis or stone.  Unremarkable bladder. Stomach/Bowel: The appendix extends superiorly from the cecal base, anterior to the terminal ileum, and is dilated with wall thickening and appendicoliths. Diffuse inflammation in the right lower quadrant. Distended and partially fluid-filled small bowel loops in the right abdomen. There is distortion of the ileocolic mesentery favoring mechanical obstruction rather than the more common ileus. Small volume peritoneal fluid along the right gutter which is complicated by peritoneal thickening. Vascular/Lymphatic: No acute vascular abnormality. Diffuse atheromatous calcification of the aorta and branch vessels. No mass or adenopathy. Reproductive:No pathologic findings. Other: No pneumoperitoneum. Musculoskeletal: No acute abnormalities. Disc degeneration with focal lumbar disc collapse at L5-S1. IMPRESSION: 1. Acute appendicitis with appendicoliths. There is also dilated small bowel and twisting of the ileocolic mesentery suggesting associated mechanical obstruction. Small volume peritoneal fluid in the right gutter with local peritonitis. 2. Cholelithiasis. 3. Long-standing and benign right adrenal mass. 4.  Aortic Atherosclerosis (ICD10-I70.0). Electronically Signed   By: Tiburcio Pea M.D.   On: 01/04/2021 04:34   DG Chest Portable 1 View  Result Date: 01/04/2021 CLINICAL DATA:  65 year old male with history of abdominal pain. Preoperative study. EXAM: PORTABLE CHEST 1 VIEW COMPARISON:  Chest x-ray 03/27/2020. FINDINGS: Lung volumes are low. No consolidative airspace disease. No pleural effusions. No pneumothorax. No pulmonary nodule or mass noted. Pulmonary vasculature and the cardiomediastinal silhouette are within normal limits. Atherosclerotic calcifications in the thoracic aorta. IMPRESSION: 1. Low lung volumes without radiographic evidence of acute cardiopulmonary disease. 2. Aortic atherosclerosis. Electronically Signed   By: Trudie Reed M.D.   On: 01/04/2021 06:13    ROS:   Pertinent items are noted in HPI.  Blood pressure 132/81, pulse 76, temperature 99.5 F (37.5 C), temperature source Oral, resp. rate (!) 29, height 6' (1.829 m), weight 65.8 kg, SpO2 97 %. Physical Exam: White male in mild discomfort while lying in the bed. Head is normocephalic, atraumatic Lungs clear to auscultation with your breath sounds bilaterally Heart examination reveals regular rate and rhythm without S3, S4, murmurs Abdomen is soft and flat with tenderness noted over McBurney's point.  No rigidity is noted.  CT scan images personally reviewed  Assessment/Plan: Impression: Acute appendicitis Plan: Patient will be taken to the operating room for laparoscopic appendectomy today.  The risks and benefits of the procedure including bleeding, infection, and the possibility of an  open procedure were fully explained to the patient, who gave informed consent.  Franky Macho 01/04/2021, 8:23 AM

## 2021-01-04 NOTE — ED Notes (Signed)
Dr. Lovell Sheehan in room to see patient at this time.

## 2021-01-04 NOTE — ED Notes (Signed)
Patient clothing removed, consent signed. States that he has dentures in personal bag.

## 2021-01-04 NOTE — ED Provider Notes (Signed)
[facility] EMERGENCY DEPARTMENT Provider Note   CSN: [id number] Arrival date & time: 01/04/21  0143     History Chief Complaint  Patient presents with   Abdominal Pain    Mike Moran is a 65 y.o. male.  Patient presents to the emergency department for evaluation of lower abdominal pain.  Patient reports that symptoms have been ongoing for 3 days.  Patient reports nausea, did have some vomiting the other day.  He has not had a bowel movement in 3 days.  He has not noticed any fever.  Patient reports that he recently had right inguinal hernia surgery and is worried that is the cause of the pain.  Pain is diffusely, sharp and crampy across the whole lower abdomen, but worse on the right.   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  Social History   Tobacco Use   Smoking status: Former   Smokeless tobacco: Never  Substance Use Topics   Alcohol use: Yes    Alcohol/week: 0.0 standard drinks    Comment: occ- pt denies SA use   Drug  use: No    Home Medications Prior to Admission medications   Medication Sig Start Date End Date Taking? Authorizing Provider  aspirin EC 81 MG tablet Take 1 tablet (81 mg total) by mouth daily. For heart health 08/04/15   Armandina Stammer I, NP  DULoxetine (CYMBALTA) 60 MG capsule TAKE (1) CAPSULE BY MOUTH ONCE DAILY. 09/01/20   Myrlene Broker, MD  hydrOXYzine (ATARAX/VISTARIL) 25 MG tablet TAKE ONE TABLET BY MOUTH EVERY 6 HOURS AS NEEDED FOR ANXIETY. 09/01/20   Myrlene Broker, MD  Multiple Vitamins-Minerals (MULTIVITAMIN WITH MINERALS) tablet Take 1 tablet by mouth daily.    [provider]  oxyCODONE-acetaminophen (PERCOCET) 7.5-325 MG tablet Take 1 tablet by mouth every 6 (six) hours as needed for severe pain. 11/13/20 11/13/21  Franky Macho, MD  risperiDONE (RISPERDAL) 0.5 MG tablet TAKE 1 TABLET BY MOUTH AT BEDTIME 11/24/20   Myrlene Broker, MD  traZODone (DESYREL) 50 MG tablet TAKE (1) TABLET BY MOUTH AT BEDTIME AS NEEDED. MAY REPEAT DOSE (1) TIME IF NEEDED FOR SLEEP. 06/02/20   Myrlene Broker, MD    Allergies    Codeine and Lexapro [escitalopram oxalate]  Review of Systems   Review of Systems  Gastrointestinal:  Positive for abdominal pain and nausea.  All other systems reviewed and are negative.  Physical Exam Updated Vital Signs BP 123/67  Pulse (!) 41   Temp 99.5 F (37.5 C) (Oral)   Resp (!) 29   Ht 6' (1.829 m)   Wt 65.8 kg   SpO2 93%   BMI 19.67 kg/m   Physical Exam Vitals and nursing note reviewed.  Constitutional:      General: He is not in acute distress.    Appearance: Normal appearance. He is well-developed.  HENT:     Head: Normocephalic and atraumatic.     Right Ear: Hearing normal.     Left Ear: Hearing normal.     Nose: Nose normal.  Eyes:     Conjunctiva/sclera: Conjunctivae normal.     Pupils: Pupils are equal, round, and reactive to light.  Cardiovascular:     Rate and Rhythm: Regular rhythm.     Heart sounds: S1 normal and S2 normal.  No murmur heard.   No friction rub. No gallop.  Pulmonary:     Effort: Pulmonary effort is normal. No respiratory distress.     Breath sounds: Normal breath sounds.  Chest:     Chest wall: No tenderness.  Abdominal:     General: Bowel sounds are normal.     Palpations: Abdomen is soft.     Tenderness: There is abdominal tenderness in the right lower quadrant. There is guarding. There is no rebound. Negative signs include Murphy's sign and McBurney's sign.     Hernia: No hernia is present.  Musculoskeletal:        General: Normal range of motion.     Cervical back: Normal range of motion and neck supple.  Skin:    General: Skin is warm and dry.     Findings: No rash.  Neurological:     Mental Status: He is alert and oriented to person, place, and time.     GCS: GCS eye subscore is 4. GCS verbal subscore is 5. GCS motor subscore is 6.     Cranial Nerves: No cranial nerve deficit.     Sensory: No sensory deficit.     Coordination: Coordination normal.  Psychiatric:        Speech: Speech normal.        Behavior: Behavior normal.        Thought Content: Thought content normal.    ED Results / Procedures / Treatments   Labs (all labs ordered are listed, but only abnormal results are displayed) Labs Reviewed  CBC WITH DIFFERENTIAL/PLATELET - Abnormal; Notable for the following components:      Result Value   WBC 24.1 (*)    RBC 3.93 (*)    Hemoglobin 12.5 (*)    HCT 36.5 (*)    Neutro Abs 21.7 (*)    Monocytes Absolute 1.3 (*)    Abs Immature Granulocytes 0.12 (*)    All other components within normal limits  BASIC METABOLIC PANEL - Abnormal; Notable for the following components:   Sodium 133 (*)    Potassium 3.4 (*)    Chloride 96 (*)    Glucose, Bld 147 (*)    Creatinine, Ser 0.58 (*)    All other components within normal limits  LACTIC ACID, PLASMA  URINALYSIS, ROUTINE W REFLEX MICROSCOPIC    EKG None  Radiology CT ABDOMEN PELVIS W CONTRAST  Result Date:  01/04/2021 CLINICAL DATA:  Abdominal pain EXAM: CT ABDOMEN AND PELVIS WITH CONTRAST TECHNIQUE: Multidetector CT imaging of the abdomen and pelvis was performed using the standard protocol following bolus administration of intravenous contrast. CONTRAST:  OMNIPAQUE  IOHEXOL 300 MG/ML  SOLN COMPARISON:  None similar FINDINGS: Lower chest:  Coronary calcification.  Mild atelectasis. Hepatobiliary: No focal liver abnormality.Cholelithiasis. No evidence of acute cholecystitis. Pancreas: Generalized atrophy Spleen: Unremarkable. Adrenals/Urinary Tract: 5.4 cm right adrenal mass with central fat, fluid, and calcific densities. This is stable and benign since at least 2010 abdominal ultrasound. Tiny low densities in the bilateral renal cortex, too small for accurate characterization. No hydronephrosis or stone. Unremarkable bladder. Stomach/Bowel: The appendix extends superiorly from the cecal base, anterior to the terminal ileum, and is dilated with wall thickening and appendicoliths. Diffuse inflammation in the right lower quadrant. Distended and partially fluid-filled small bowel loops in the right abdomen. There is distortion of the ileocolic mesentery favoring mechanical obstruction rather than the more common ileus. Small volume peritoneal fluid along the right gutter which is complicated by peritoneal thickening. Vascular/Lymphatic: No acute vascular abnormality. Diffuse atheromatous calcification of the aorta and branch vessels. No mass or adenopathy. Reproductive:No pathologic findings. Other: No pneumoperitoneum. Musculoskeletal: No acute abnormalities. Disc degeneration with focal lumbar disc collapse at L5-S1. IMPRESSION: 1. Acute appendicitis with appendicoliths. There is also dilated small bowel and twisting of the ileocolic mesentery suggesting associated mechanical obstruction. Small volume peritoneal fluid in the right gutter with local peritonitis. 2. Cholelithiasis. 3. Long-standing and benign right  adrenal mass. 4.  Aortic Atherosclerosis (ICD10-I70.0). Electronically Signed   By: Tiburcio Pea M.D.   On: 01/04/2021 04:34    Procedures Procedures   Medications Ordered in ED Medications  cefTRIAXone (ROCEPHIN) 2 g in sodium chloride 0.9 % 100 mL IVPB (2 g Intravenous New Bag/Given 01/04/21 0501)  metroNIDAZOLE (FLAGYL) IVPB 500 mg (has no administration in time range)  ondansetron (ZOFRAN) injection 4 mg (4 mg Intravenous Given 01/04/21 0218)  HYDROmorphone (DILAUDID) injection 1 mg (1 mg Intravenous Given 01/04/21 0220)  iohexol (OMNIPAQUE) 300 MG/ML solution 100 mL (100 mLs Intravenous Contrast Given 01/04/21 0409)  HYDROmorphone (DILAUDID) injection 1 mg (1 mg Intravenous Given 01/04/21 0458)    ED Course  I have reviewed the triage vital signs and the nursing notes.  Pertinent labs & imaging results that were available during my care of the patient were reviewed by me and considered in my medical decision making (see chart for details).    MDM Rules/Calculators/A&P                           Patient complaining of diffuse lower abdominal pain for 3 days.  He reports recent right inguinal herniorrhaphy.  Surgical site has healed well, no recurrent hernia.  Patient does have significant tenderness with guarding in the right lower quadrant.  He has a elevated white blood cell count.  CT scan shows evidence of appendicitis with possible mechanical obstruction of the small bowel.  Final Clinical Impression(s) / ED Diagnoses Final diagnoses:  Acute appendicitis with localized peritonitis, without perforation, abscess, or gangrene    Rx / DC Orders ED Discharge Orders     None        Yerlin Gasparyan, Canary Brim, MD 01/04/21 0510

## 2021-01-04 NOTE — ED Notes (Signed)
OR team transported patient to PACU at this time.

## 2021-01-04 NOTE — Anesthesia Procedure Notes (Signed)
Procedure Name: Intubation Date/Time: 01/04/2021 9:33 AM Performed by: Denese Killings, MD Pre-anesthesia Checklist: Patient identified, Emergency Drugs available, Suction available and Patient being monitored Patient Re-evaluated:Patient Re-evaluated prior to induction Oxygen Delivery Method: Circle system utilized Preoxygenation: Pre-oxygenation with 100% oxygen Induction Type: IV induction and Rapid sequence Laryngoscope Size: 3 and Mac Grade View: Grade II Tube type: Oral Tube size: 7.5 mm Number of attempts: 1 Airway Equipment and Method: Stylet Placement Confirmation: ETT inserted through vocal cords under direct vision, positive ETCO2 and breath sounds checked- equal and bilateral Secured at: 26 cm Tube secured with: Tape Dental Injury: Teeth and Oropharynx as per pre-operative assessment

## 2021-01-04 NOTE — Op Note (Signed)
Patient:  Mike Moran  DOB:  [birth year]  MRN:  [id number]   Preop Diagnosis: Acute appendicitis  Postop Diagnosis: Perforated appendicitis with fecal spillage  Procedure: Laparoscopic appendectomy  Surgeon: Franky Macho, MD  Anes: General endotracheal  Indications: Patient is a 65 year old white male who presents with a 4-day history of worsening right lower quadrant abdominal pain.  CT scan of the abdomen revealed acute appendicitis with appendicoliths and free fluid in the pelvis.  The risks and benefits of the procedure including bleeding, infection, and the possibility of an open procedure were fully explained to the patient, who gave informed consent.  Procedure note: The patient was placed in the supine position.  After induction of general endotracheal anesthesia, the abdomen was prepped and draped using the usual sterile technique with ChloraPrep.  Surgical site confirmation was performed.  A supraumbilical incision was made down to the fascia.  A Veress needle was introduced into the abdominal cavity and confirmation of placement was done using the saline drop test.  The abdomen was then insufflated to 15 mmHg pressure.  An 11 mm trocar was introduced into the abdominal cavity under direct visualization without difficulty.  The patient was placed in deeper Trendelenburg position and an additional 12 mm trocar was placed in the suprapubic region and a 5 mm trocar was placed in the left lower quadrant region.  In the right lower quadrant, there were multiple loops of small bowel that were slightly dilated with purulent fluid in the right paracolic gutter as well as in the pelvis.  There was also evidence of stool within the fluid.  There was evidence of fecal peritonitis.  This fluid was evacuated.  Ultimately, I was able to find the appendix.  The midportion of the appendix had ruptured.  The mesial appendix was divided using the harmonic scalpel.  The appendix was removed into segments  due to the gangrenous rupture of the midportion of the appendix.  I was able to find the base of the appendix at the cecum.  A vascular Endo GIA was placed across the base of the appendix and fired.  The appendix was removed using an Endo Catch bag without difficulty.  The staple line was inspected noted to be within normal limits.  The right lower quadrant and pelvis were copiously irrigated with normal saline until the effluent was clear.  A #10 flat Jackson-Pratt drain was placed into the right lower quadrant and pelvis and brought out through the left lower quadrant 5 mm trocar site.  It was secured at the skin level using a 3-0 nylon suture.  All fluid and air were then evacuated from the abdominal cavity prior to removal of the trochars.  All wounds were irrigated with normal saline.  All wounds were injected with Exparel.  The supraumbilical fascia as well as suprapubic fascia were reapproximated using 0 Vicryl interrupted sutures.  All skin incisions were closed using staples.  Betadine ointment and dry sterile dressing were applied.  All tape and needle counts were correct at the end of the procedure.  The patient was extubated in the operating room and transferred to PACU in stable condition.  Complications: None  EBL: Minimal  Specimen: Appendix  Drains: Jackson-Pratt drain to right lower quadrant and pelvis

## 2021-01-04 NOTE — ED Triage Notes (Addendum)
Pt brought in by [facility] from home. Complains of burning and stabbing pain in lower abdomin. Had hernia surgery x3 weeks ago, No bowel movement in 3 days.

## 2021-01-04 NOTE — Anesthesia Preprocedure Evaluation (Addendum)
Anesthesia Evaluation  Patient identified by MRN, date of birth, ID band Patient awake    Reviewed: Allergy & Precautions, NPO status , Patient's Chart, lab work & pertinent test results  Airway Mallampati: II  TM Distance: >3 FB Neck ROM: Full    Dental  (+) Edentulous Upper, Edentulous Lower   Pulmonary pneumonia, former smoker,    Pulmonary exam normal breath sounds clear to auscultation       Cardiovascular Exercise Tolerance: Good + dysrhythmias  Rhythm:Irregular Rate:Tachycardia - Systolic murmurs, - Diastolic murmurs, - Friction Rub, - Carotid Bruit, - Peripheral Edema and - Systolic Click 04-Jan-2021 05:41:39 [facility] ROUTINE RECORD Jul 25, 1955 (18 yr) Male Caucasian Vent. rate 88 BPM PR interval 179 ms QRS duration 85 ms QT/QTcB 320/388 ms P-R-T axes 67 -59 67 Sinus tachycardia Multiform ventricular premature complexes LAD, consider left anterior fascicular block Anteroseptal infarct, age indeterminate Confirmed by Gilda Crease [phone]) on 01/04/2021 6:26:48 AM   Neuro/Psych PSYCHIATRIC DISORDERS Anxiety Depression    GI/Hepatic GERD  Medicated,(+)     substance abuse  alcohol use,   Endo/Other  negative endocrine ROS  Renal/GU negative Renal ROS     Musculoskeletal negative musculoskeletal ROS (+)   Abdominal   Peds  Hematology  (+) anemia ,   Anesthesia Other Findings   Reproductive/Obstetrics negative OB ROS                            Anesthesia Physical  Anesthesia Plan  ASA: 3 and emergent  Anesthesia Plan: General   Post-op Pain Management: Dilaudid IV   Induction: Intravenous and Rapid sequence  PONV Risk Score and Plan: 4 or greater and Ondansetron and Dexamethasone  Airway Management Planned: Oral ETT  Additional Equipment:   Intra-op Plan:   Post-operative Plan: Extubation in OR  Informed Consent: I have reviewed  the patients History and Physical, chart, labs and discussed the procedure including the risks, benefits and alternatives for the proposed anesthesia with the patient or authorized representative who has indicated his/her understanding and acceptance.     Dental advisory given  Plan Discussed with: CRNA and Surgeon  Anesthesia Plan Comments:        Anesthesia Quick Evaluation

## 2021-01-05 ENCOUNTER — Encounter (HOSPITAL_COMMUNITY): Payer: Self-pay | Admitting: General Surgery

## 2021-01-05 LAB — CBC
HCT: 28.6 % — ABNORMAL LOW (ref 39.0–52.0)
Hemoglobin: 9.7 g/dL — ABNORMAL LOW (ref 13.0–17.0)
MCH: 31.9 pg (ref 26.0–34.0)
MCHC: 33.9 g/dL (ref 30.0–36.0)
MCV: 94.1 fL (ref 80.0–100.0)
Platelets: 251 10*3/uL (ref 150–400)
RBC: 3.04 MIL/uL — ABNORMAL LOW (ref 4.22–5.81)
RDW: 13.1 % (ref 11.5–15.5)
WBC: 20.4 10*3/uL — ABNORMAL HIGH (ref 4.0–10.5)
nRBC: 0 % (ref 0.0–0.2)

## 2021-01-05 LAB — PHOSPHORUS: Phosphorus: 2.8 mg/dL (ref 2.5–4.6)

## 2021-01-05 LAB — BASIC METABOLIC PANEL
Anion gap: 7 (ref 5–15)
BUN: 20 mg/dL (ref 8–23)
CO2: 27 mmol/L (ref 22–32)
Calcium: 8.3 mg/dL — ABNORMAL LOW (ref 8.9–10.3)
Chloride: 101 mmol/L (ref 98–111)
Creatinine, Ser: 0.53 mg/dL — ABNORMAL LOW (ref 0.61–1.24)
GFR, Estimated: >60 mL/min (ref >60)
Glucose, Bld: 130 mg/dL — ABNORMAL HIGH (ref 70–99)
Potassium: 3.8 mmol/L (ref 3.5–5.1)
Sodium: 135 mmol/L (ref 135–145)

## 2021-01-05 LAB — MAGNESIUM: Magnesium: 2.2 mg/dL (ref 1.7–2.4)

## 2021-01-05 MED ORDER — PANTOPRAZOLE SODIUM 40 MG PO TBEC
40.0000 mg | DELAYED_RELEASE_TABLET | Freq: Every day | ORAL | Status: DC
  Administered 2021-01-05 – 2021-01-07 (×3): 40 mg via ORAL
  Filled 2021-01-05 (×3): qty 1

## 2021-01-05 MED ORDER — TRAZODONE HCL 50 MG PO TABS
50.0000 mg | ORAL_TABLET | Freq: Once | ORAL | Status: AC
  Administered 2021-01-05: 01:00:00 50 mg via ORAL
  Filled 2021-01-05: qty 1

## 2021-01-05 MED ORDER — TRAZODONE HCL 50 MG PO TABS
100.0000 mg | ORAL_TABLET | Freq: Every evening | ORAL | Status: DC | PRN
  Administered 2021-01-05 – 2021-01-06 (×2): 100 mg via ORAL
  Filled 2021-01-05 (×2): qty 2

## 2021-01-05 MED ORDER — ALUM & MAG HYDROXIDE-SIMETH 200-200-20 MG/5ML PO SUSP
30.0000 mL | Freq: Four times a day (QID) | ORAL | Status: DC | PRN
  Administered 2021-01-05 – 2021-01-06 (×2): 30 mL via ORAL
  Filled 2021-01-05 (×3): qty 30

## 2021-01-05 NOTE — Progress Notes (Signed)
1 Day Post-Op  Subjective: Patient having moderate incisional pain and nausea.  Objective: Vital signs in last 24 hours: Temp:  [97.4 F (36.3 C)-99.4 F (37.4 C)] 99.2 F (37.3 C) (11/28 0630) Pulse Rate:  [63-109] 63 (11/28 0630) Resp:  [10-25] 14 (11/28 0630) BP: (95-129)/(66-88) 95/66 (11/28 0630) SpO2:  [95 %-100 %] 97 % (11/28 0630) Last BM Date: 01/01/21  Intake/Output from previous day: 11/27 0701 - 11/28 0700 In: 3909.1 [P.O.:1410; I.V.:2406.3; IV Piggyback:92.8] Out: 230 [Urine:220; Blood:10] Intake/Output this shift: No intake/output data recorded.  General appearance: alert, cooperative, and no distress Resp: clear to auscultation bilaterally Cardio: regular rate and rhythm, S1, S2 normal, no murmur, click, rub or gallop GI: Soft, flat.  Incisions healing well.  Serosanguineous drainage from JP drain.  Lab Results:  Recent Labs    01/04/21 0229 01/05/21 0317  WBC 24.1* 20.4*  HGB 12.5* 9.7*  HCT 36.5* 28.6*  PLT 323 251   BMET Recent Labs    01/04/21 0229 01/05/21 0317  NA 133* 135  K 3.4* 3.8  CL 96* 101  CO2 27 27  GLUCOSE 147* 130*  BUN 17 20  CREATININE 0.58* 0.53*  CALCIUM 9.3 8.3*   PT/INR No results for input(s): LABPROT, INR in the last 72 hours.  Studies/Results: CT ABDOMEN PELVIS W CONTRAST  Result Date: 01/04/2021 CLINICAL DATA:  Abdominal pain EXAM: CT ABDOMEN AND PELVIS WITH CONTRAST TECHNIQUE: Multidetector CT imaging of the abdomen and pelvis was performed using the standard protocol following bolus administration of intravenous contrast. CONTRAST:  OMNIPAQUE IOHEXOL 300 MG/ML  SOLN COMPARISON:  None similar FINDINGS: Lower chest:  Coronary calcification.  Mild atelectasis. Hepatobiliary: No focal liver abnormality.Cholelithiasis. No evidence of acute cholecystitis. Pancreas: Generalized atrophy Spleen: Unremarkable. Adrenals/Urinary Tract: 5.4 cm right adrenal mass with central fat, fluid, and calcific densities. This is  stable and benign since at least 2010 abdominal ultrasound. Tiny low densities in the bilateral renal cortex, too small for accurate characterization. No hydronephrosis or stone. Unremarkable bladder. Stomach/Bowel: The appendix extends superiorly from the cecal base, anterior to the terminal ileum, and is dilated with wall thickening and appendicoliths. Diffuse inflammation in the right lower quadrant. Distended and partially fluid-filled small bowel loops in the right abdomen. There is distortion of the ileocolic mesentery favoring mechanical obstruction rather than the more common ileus. Small volume peritoneal fluid along the right gutter which is complicated by peritoneal thickening. Vascular/Lymphatic: No acute vascular abnormality. Diffuse atheromatous calcification of the aorta and branch vessels. No mass or adenopathy. Reproductive:No pathologic findings. Other: No pneumoperitoneum. Musculoskeletal: No acute abnormalities. Disc degeneration with focal lumbar disc collapse at L5-S1. IMPRESSION: 1. Acute appendicitis with appendicoliths. There is also dilated small bowel and twisting of the ileocolic mesentery suggesting associated mechanical obstruction. Small volume peritoneal fluid in the right gutter with local peritonitis. 2. Cholelithiasis. 3. Long-standing and benign right adrenal mass. 4.  Aortic Atherosclerosis (ICD10-I70.0). Electronically Signed   By: Tiburcio Pea M.D.   On: 01/04/2021 04:34   DG Chest Portable 1 View  Result Date: 01/04/2021 CLINICAL DATA:  65 year old male with history of abdominal pain. Preoperative study. EXAM: PORTABLE CHEST 1 VIEW COMPARISON:  Chest x-ray 03/27/2020. FINDINGS: Lung volumes are low. No consolidative airspace disease. No pleural effusions. No pneumothorax. No pulmonary nodule or mass noted. Pulmonary vasculature and the cardiomediastinal silhouette are within normal limits. Atherosclerotic calcifications in the thoracic aorta. IMPRESSION: 1. Low lung  volumes without radiographic evidence of acute cardiopulmonary disease. 2. Aortic atherosclerosis. Electronically  Signed   By: Trudie Reed M.D.   On: 01/04/2021 06:13    Anti-infectives: Anti-infectives (From admission, onward)    Start     Dose/Rate Route Frequency Ordered Stop   01/04/21 1200  piperacillin-tazobactam (ZOSYN) IVPB 3.375 g        3.375 g 12.5 mL/hr over 240 Minutes Intravenous Every 8 hours 01/04/21 1144 01/09/21 1159   01/04/21 0500  cefTRIAXone (ROCEPHIN) 2 g in sodium chloride 0.9 % 100 mL IVPB        2 g 200 mL/hr over 30 Minutes Intravenous  Once 01/04/21 0450 01/04/21 0700   01/04/21 0500  metroNIDAZOLE (FLAGYL) IVPB 500 mg        500 mg 100 mL/hr over 60 Minutes Intravenous  Once 01/04/21 0450 01/04/21 7209       Assessment/Plan: s/p Procedure(s): APPENDECTOMY LAPAROSCOPIC Impression: Stable on postoperative day 1, status post laparoscopic appendectomy for perforated appendicitis.  His leukocytosis is starting to resolve.  He has some bloody drainage in the JP drain, thus will hold Lovenox for now.  Patient still sore and this is secondary to his localized peritonitis.  Patient also has history of low tolerance for pain.  LOS: 1 day    Franky Macho 01/05/2021

## 2021-01-06 LAB — BASIC METABOLIC PANEL
Anion gap: 5 (ref 5–15)
BUN: 17 mg/dL (ref 8–23)
CO2: 29 mmol/L (ref 22–32)
Calcium: 8.3 mg/dL — ABNORMAL LOW (ref 8.9–10.3)
Chloride: 100 mmol/L (ref 98–111)
Creatinine, Ser: 0.49 mg/dL — ABNORMAL LOW (ref 0.61–1.24)
GFR, Estimated: >60 mL/min (ref >60)
Glucose, Bld: 108 mg/dL — ABNORMAL HIGH (ref 70–99)
Potassium: 3.7 mmol/L (ref 3.5–5.1)
Sodium: 134 mmol/L — ABNORMAL LOW (ref 135–145)

## 2021-01-06 LAB — CBC
HCT: 33.5 % — ABNORMAL LOW (ref 39.0–52.0)
Hemoglobin: 11 g/dL — ABNORMAL LOW (ref 13.0–17.0)
MCH: 31 pg (ref 26.0–34.0)
MCHC: 32.8 g/dL (ref 30.0–36.0)
MCV: 94.4 fL (ref 80.0–100.0)
Platelets: 301 10*3/uL (ref 150–400)
RBC: 3.55 MIL/uL — ABNORMAL LOW (ref 4.22–5.81)
RDW: 13 % (ref 11.5–15.5)
WBC: 14.8 10*3/uL — ABNORMAL HIGH (ref 4.0–10.5)
nRBC: 0 % (ref 0.0–0.2)

## 2021-01-06 LAB — PHOSPHORUS: Phosphorus: 2.3 mg/dL — ABNORMAL LOW (ref 2.5–4.6)

## 2021-01-06 LAB — SURGICAL PATHOLOGY

## 2021-01-06 LAB — MAGNESIUM: Magnesium: 2.1 mg/dL (ref 1.7–2.4)

## 2021-01-06 MED ORDER — POTASSIUM PHOSPHATES 15 MMOLE/5ML IV SOLN
15.0000 mmol | Freq: Once | INTRAVENOUS | Status: AC
  Administered 2021-01-06: 15 mmol via INTRAVENOUS
  Filled 2021-01-06: qty 5

## 2021-01-06 MED ORDER — METOCLOPRAMIDE HCL 10 MG PO TABS
10.0000 mg | ORAL_TABLET | Freq: Three times a day (TID) | ORAL | Status: DC
  Administered 2021-01-07: 10 mg via ORAL
  Filled 2021-01-06: qty 1

## 2021-01-06 MED ORDER — METOCLOPRAMIDE HCL 10 MG PO TABS
ORAL_TABLET | ORAL | Status: AC
  Administered 2021-01-06: 10 mg via ORAL
  Filled 2021-01-06: qty 1

## 2021-01-06 NOTE — Progress Notes (Signed)
2 Days Post-Op  Subjective: Patient still has incisional pain, but his nausea seems to be somewhat less.  He did have a bowel movement yesterday.  He states he has been doing this at home intermittently for many weeks.  Objective: Vital signs in last 24 hours: Temp:  [98.4 F (36.9 C)-98.8 F (37.1 C)] 98.4 F (36.9 C) (11/29 0622) Pulse Rate:  [84-86] 85 (11/29 0622) Resp:  [18] 18 (11/29 0622) BP: (110-131)/(65-77) 131/65 (11/29 0622) SpO2:  [98 %] 98 % (11/29 0622) Last BM Date: 01/05/21  Intake/Output from previous day: 11/28 0701 - 11/29 0700 In: 1200 [P.O.:1200] Out: 1200 [Urine:1200] Intake/Output this shift: No intake/output data recorded.  General appearance: alert, cooperative, and no distress Resp: clear to auscultation bilaterally Cardio: regular rate and rhythm, S1, S2 normal, no murmur, click, rub or gallop GI: Soft, incisions healing well.  Bowel sounds present.  JP drainage with serosanguineous fluid present.  Lab Results:  Recent Labs    01/05/21 0317 01/06/21 0506  WBC 20.4* 14.8*  HGB 9.7* 11.0*  HCT 28.6* 33.5*  PLT 251 301   BMET Recent Labs    01/05/21 0317 01/06/21 0506  NA 135 134*  K 3.8 3.7  CL 101 100  CO2 27 29  GLUCOSE 130* 108*  BUN 20 17  CREATININE 0.53* 0.49*  CALCIUM 8.3* 8.3*   PT/INR No results for input(s): LABPROT, INR in the last 72 hours.  Studies/Results: No results found.  Anti-infectives: Anti-infectives (From admission, onward)    Start     Dose/Rate Route Frequency Ordered Stop   01/04/21 1200  piperacillin-tazobactam (ZOSYN) IVPB 3.375 g        3.375 g 12.5 mL/hr over 240 Minutes Intravenous Every 8 hours 01/04/21 1144 01/09/21 1159   01/04/21 0500  cefTRIAXone (ROCEPHIN) 2 g in sodium chloride 0.9 % 100 mL IVPB        2 g 200 mL/hr over 30 Minutes Intravenous  Once 01/04/21 0450 01/04/21 0700   01/04/21 0500  metroNIDAZOLE (FLAGYL) IVPB 500 mg        500 mg 100 mL/hr over 60 Minutes Intravenous  Once  01/04/21 0450 01/04/21 3710       Assessment/Plan: s/p Procedure(s): APPENDECTOMY LAPAROSCOPIC Impression: Gradually improving on postoperative day 2.  Leukocytosis is resolving.  His GI function is returning. Plan: Continue IV Zosyn.  Ambulate patient.  Hopefully can discharge in next 24 to 48 hours.  LOS: 2 days    Franky Macho 01/06/2021

## 2021-01-06 NOTE — Progress Notes (Signed)
Pharmacy Electrolyte Replacement  Recent Labs:  Recent Labs    01/06/21 0506  K 3.7  MG 2.1  PHOS 2.3*  CREATININE 0.49*    Low Critical Values (K </= 2.5, Phos </= 1, Mg </= 1) :  A/Plan: patient post appendectomy, still nauseated. Waiting for GI function to return. POD #2 with low phos, Pharmacy asked to replace. KPhos IV x 1 dose F/u repeat Phos  Elder Cyphers, BS Loura Back, BCPS Clinical Pharmacist Pager [phone]

## 2021-01-07 LAB — BASIC METABOLIC PANEL
Anion gap: 7 (ref 5–15)
BUN: 14 mg/dL (ref 8–23)
CO2: 27 mmol/L (ref 22–32)
Calcium: 8.3 mg/dL — ABNORMAL LOW (ref 8.9–10.3)
Chloride: 100 mmol/L (ref 98–111)
Creatinine, Ser: 0.48 mg/dL — ABNORMAL LOW (ref 0.61–1.24)
GFR, Estimated: >60 mL/min (ref >60)
Glucose, Bld: 120 mg/dL — ABNORMAL HIGH (ref 70–99)
Potassium: 3.4 mmol/L — ABNORMAL LOW (ref 3.5–5.1)
Sodium: 134 mmol/L — ABNORMAL LOW (ref 135–145)

## 2021-01-07 LAB — CBC
HCT: 32.5 % — ABNORMAL LOW (ref 39.0–52.0)
Hemoglobin: 10.6 g/dL — ABNORMAL LOW (ref 13.0–17.0)
MCH: 30.5 pg (ref 26.0–34.0)
MCHC: 32.6 g/dL (ref 30.0–36.0)
MCV: 93.7 fL (ref 80.0–100.0)
Platelets: 295 10*3/uL (ref 150–400)
RBC: 3.47 MIL/uL — ABNORMAL LOW (ref 4.22–5.81)
RDW: 12.8 % (ref 11.5–15.5)
WBC: 14 10*3/uL — ABNORMAL HIGH (ref 4.0–10.5)
nRBC: 0 % (ref 0.0–0.2)

## 2021-01-07 LAB — PHOSPHORUS: Phosphorus: 3.2 mg/dL (ref 2.5–4.6)

## 2021-01-07 MED ORDER — ONDANSETRON HCL 4 MG PO TABS: 4.0000 mg | ORAL_TABLET | Freq: Three times a day (TID) | ORAL | 1 refills | Status: DC | PRN

## 2021-01-07 MED ORDER — AMOXICILLIN-POT CLAVULANATE 875-125 MG PO TABS: 1.0000 | ORAL_TABLET | Freq: Two times a day (BID) | ORAL | 0 refills | Status: DC

## 2021-01-07 MED ORDER — OXYCODONE-ACETAMINOPHEN 7.5-325 MG PO TABS: 1.0000 | ORAL_TABLET | ORAL | 0 refills | Status: DC | PRN

## 2021-01-07 MED ORDER — PANTOPRAZOLE SODIUM 40 MG PO TBEC: 40.0000 mg | DELAYED_RELEASE_TABLET | Freq: Every day | ORAL | 1 refills | Status: DC

## 2021-01-07 NOTE — Progress Notes (Signed)
Pt discharged and taken down to main entrance via wheelchair by staff.

## 2021-01-07 NOTE — Discharge Summary (Signed)
Physician Discharge Summary  Patient ID: Mike Moran MRN: [id number] DOB/AGE: December 06, 1955 65 y.o.  Admit date: 01/04/2021 Discharge date: 01/07/2021  Admission Diagnoses: Perforated appendicitis  Discharge Diagnoses: Same Principal Problem:   Perforated appendicitis Active Problems:   Acute appendicitis with localized peritonitis, without perforation, abscess, or gangrene   Acute perforated appendicitis Anxiety,  Discharged Condition: good  Hospital Course: Patient is a 65 year old white male who presented to the emergency room with 4-day history of worsening right lower quadrant abdominal pain.  CT scan of the abdomen revealed acute appendicitis with periappendiceal fluid.  The patient was taken to the operating room on 01/04/2021 and was found to have a a perforated appendicitis.  A drain was left in the appendix removed.  His postoperative course has been remarkable for mild nausea and incisional pain.  His diet was advanced out difficulty once his bowel function returned.  His leukocytosis has been returning to normal.  He is being discharged home on 01/07/2021 in good and stable condition.   Treatments: surgery: Laparoscopic appendectomy on 01/04/2021  Discharge Exam: Blood pressure 112/76, pulse 95, temperature 98 F (36.7 C), resp. rate 18, height 6' (1.829 m), weight 65.8 kg, SpO2 100 %. General appearance: alert, cooperative, and no distress Resp: clear to auscultation bilaterally Cardio: regular rate and rhythm, S1, S2 normal, no murmur, click, rub or gallop GI: Soft, flat.  Incisions healing well.  JP drainage with serosanguineous fluid present.  Disposition: Discharge disposition: 01-Home or Self Care       Discharge Instructions     Diet - low sodium heart healthy   Complete by: As directed    Increase activity slowly   Complete by: As directed       Allergies as of 01/07/2021       Reactions   Codeine Nausea And Vomiting   Lexapro [escitalopram  Oxalate] Other (See Comments)   lethargic        Medication List     TAKE these medications    amoxicillin-clavulanate 875-125 MG tablet Commonly known as: Augmentin Take 1 tablet by mouth 2 (two) times daily.   aspirin EC 81 MG tablet Take 1 tablet (81 mg total) by mouth daily. For heart health   DULoxetine 60 MG capsule Commonly known as: CYMBALTA TAKE 1 CAPSULE BY MOUTH EVERY DAY What changed: See the new instructions.   hydrOXYzine 25 MG tablet Commonly known as: ATARAX TAKE ONE TABLET BY MOUTH EVERY 6 HOURS AS NEEDED FOR ANXIETY.   multivitamin with minerals tablet Take 1 tablet by mouth daily.   ondansetron 4 MG tablet Commonly known as: Zofran Take 1 tablet (4 mg total) by mouth every 8 (eight) hours as needed for nausea or vomiting.   oxyCODONE-acetaminophen 7.5-325 MG tablet Commonly known as: Percocet Take 1 tablet by mouth every 4 (four) hours as needed for severe pain. What changed: when to take this   pantoprazole 40 MG tablet Commonly known as: Protonix Take 1 tablet (40 mg total) by mouth daily.   risperiDONE 0.5 MG tablet Commonly known as: RISPERDAL TAKE 1 TABLET BY MOUTH AT BEDTIME   traZODone 50 MG tablet Commonly known as: DESYREL TAKE 1 TABLET BY MOUTH AT BEDTIME AS NEEDED FOR SLEEP. MAY REPEAT DOSE 1 TIME IF NEEDED FOR SLEEP What changed: See the new instructions.        Follow-up Information     Franky Macho, MD. Schedule an appointment as soon as possible for a visit on 01/13/2021.   Specialty: General  Surgery Contact information: [address] [place] [address] [phone]                 Signed: Franky Macho 01/07/2021, 8:55 AM

## 2021-01-07 NOTE — Progress Notes (Signed)
60 mls serosanquinous lst night.  Continues to request pain medication about every three hours

## 2021-01-12 ENCOUNTER — Other Ambulatory Visit (HOSPITAL_COMMUNITY): Payer: Self-pay | Admitting: Psychiatry

## 2021-01-13 ENCOUNTER — Other Ambulatory Visit (HOSPITAL_COMMUNITY): Payer: Self-pay | Admitting: Psychiatry

## 2021-01-13 ENCOUNTER — Other Ambulatory Visit: Payer: Self-pay

## 2021-01-13 ENCOUNTER — Encounter: Payer: Self-pay | Admitting: General Surgery

## 2021-01-13 ENCOUNTER — Ambulatory Visit (INDEPENDENT_AMBULATORY_CARE_PROVIDER_SITE_OTHER): Payer: Medicare Other | Admitting: General Surgery

## 2021-01-13 VITALS — BP 101/67 | HR 94 | Temp 98.4°F | Resp 14 | Ht 72.0 in | Wt 128.0 lb

## 2021-01-13 DIAGNOSIS — Z09 Encounter for follow-up examination after completed treatment for conditions other than malignant neoplasm: Secondary | ICD-10-CM

## 2021-01-13 MED ORDER — OXYCODONE-ACETAMINOPHEN 7.5-325 MG PO TABS: 1.0000 | ORAL_TABLET | ORAL | 0 refills | Status: DC | PRN

## 2021-01-13 MED ORDER — RISPERIDONE 0.5 MG PO TABS: ORAL_TABLET | ORAL | 2 refills | Status: DC

## 2021-01-13 NOTE — Progress Notes (Signed)
Subjective:     Mike Moran  Patient here for postoperative visit, status post laparoscopic appendectomy with drain placement.  Patient still having moderate incisional pain.  No fever chills have been noted.  He has had minimal drainage from the JP drain. Objective:    BP 101/67   Pulse 94   Temp 98.4 F (36.9 C) (Other (Comment))   Resp 14   Ht 6' (1.829 m)   Wt 128 lb (58.1 kg)   SpO2 95%   BMI 17.36 kg/m   General:  alert, cooperative, and no distress  Abdomen soft, incisions healing well.  Staples removed.  JP drain removed. Final pathology consistent with diagnosis.     Assessment:    Doing well postoperatively. Having incisional pain.    Plan:   I have reordered his Percocet for 1 more week.  I told him this was the last time I could prescribe pain pills for him.  He understands.  Follow-up here as needed.

## 2021-03-17 ENCOUNTER — Telehealth (HOSPITAL_COMMUNITY): Payer: Self-pay | Admitting: Psychiatry

## 2021-03-17 NOTE — Telephone Encounter (Signed)
Called to schedule f/u appt unable to leave vm  °

## 2021-04-03 ENCOUNTER — Other Ambulatory Visit (HOSPITAL_COMMUNITY): Payer: Self-pay | Admitting: Psychiatry

## 2021-06-05 ENCOUNTER — Other Ambulatory Visit (HOSPITAL_COMMUNITY): Payer: Self-pay | Admitting: Psychiatry

## 2021-08-22 ENCOUNTER — Other Ambulatory Visit (HOSPITAL_COMMUNITY): Payer: Self-pay | Admitting: Psychiatry

## 2021-08-24 NOTE — Telephone Encounter (Signed)
Call for appt, not seen in over one year

## 2021-12-16 ENCOUNTER — Other Ambulatory Visit (HOSPITAL_COMMUNITY): Payer: Self-pay | Admitting: Psychiatry

## 2021-12-17 NOTE — Telephone Encounter (Signed)
Needs appt

## 2022-02-17 IMAGING — CT CT ABD-PELV W/ CM
2 of 5 series · 16 of 46 positions shown, 18 images · IV contrast (omnipaque)
Comparison: CT chest 03/28/2020

CLINICAL DATA: Right lower quadrant pain, concern for hernia

EXAM:
CT ABDOMEN AND PELVIS WITH CONTRAST
TECHNIQUE: Multidetector CT imaging of the abdomen and pelvis was performed
using the standard protocol following bolus administration of
intravenous contrast.
CONTRAST:  80mL OMNIPAQUE IOHEXOL 350 MG/ML SOLN

[Series 3: axial st · axial · 0.76mm/px · z∈[-536,-122]mm · 13 of 95 slices shown, 15 images]
[im 6/95  soft-tissue]
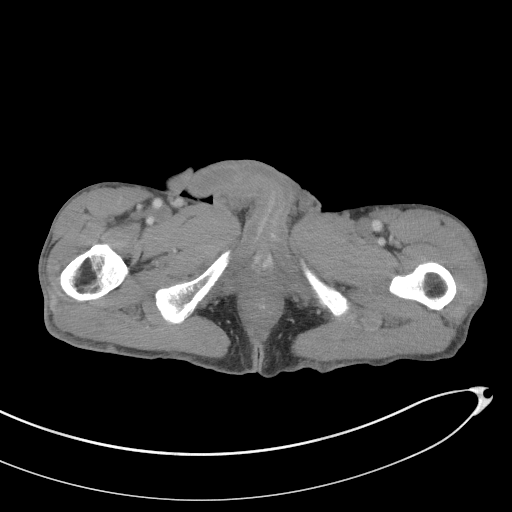
[im 6/95  bone]
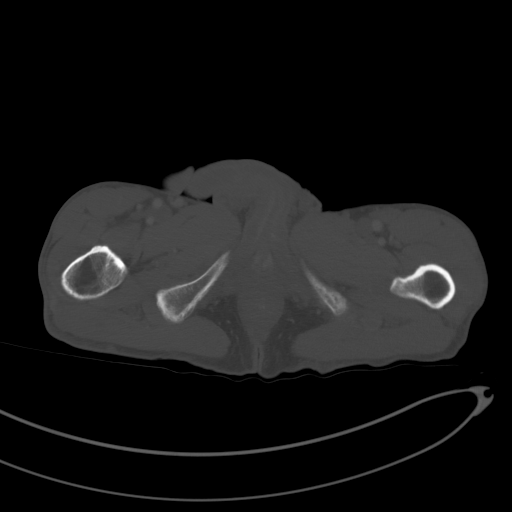
[im 11/95  soft-tissue]
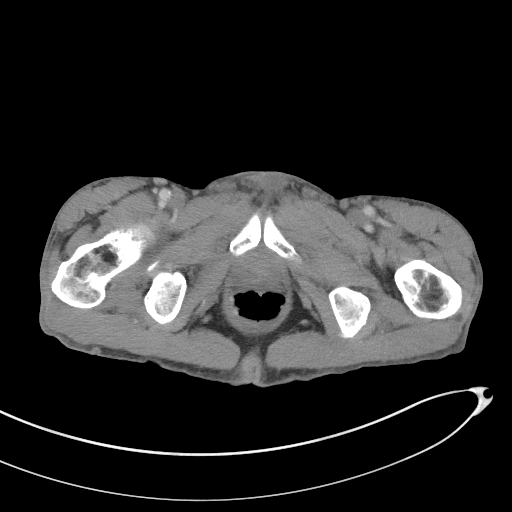
[im 21/95  soft-tissue]
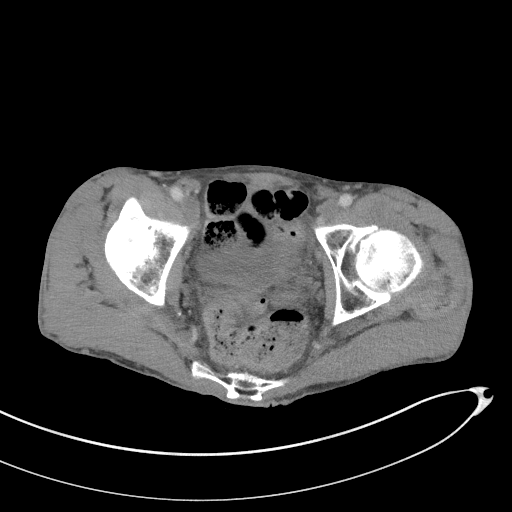
[im 27/95  soft-tissue]
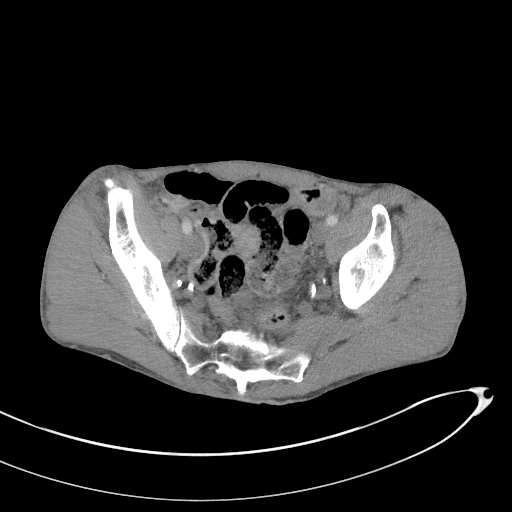
[im 32/95  soft-tissue]
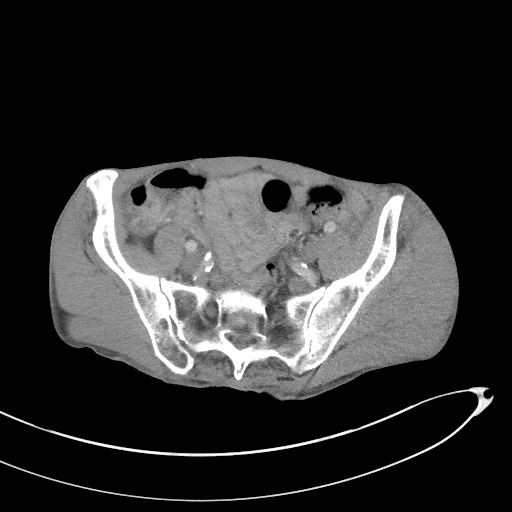
[im 42/95  soft-tissue]
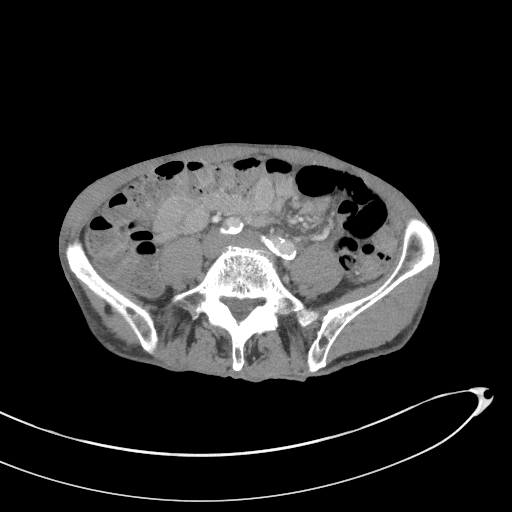
[im 48/95  soft-tissue]
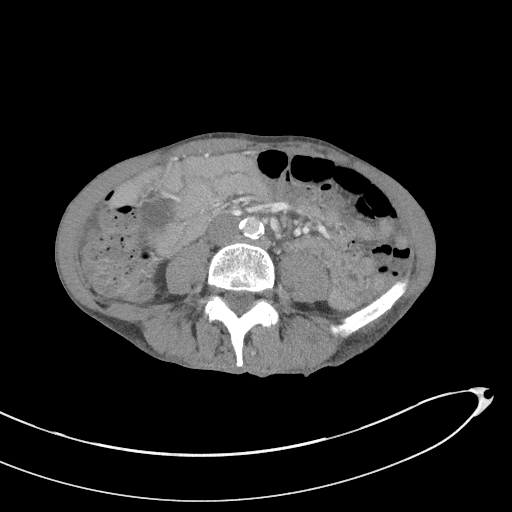
[im 53/95  soft-tissue]
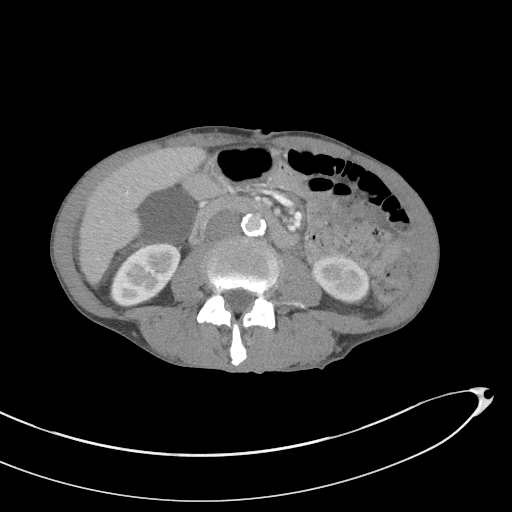
[im 63/95  soft-tissue]
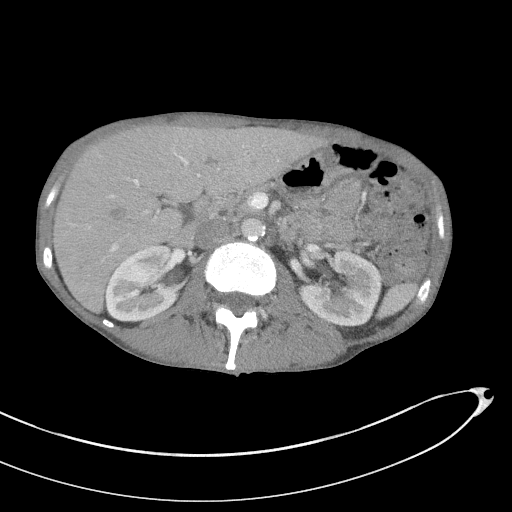
[im 63/95  bone]
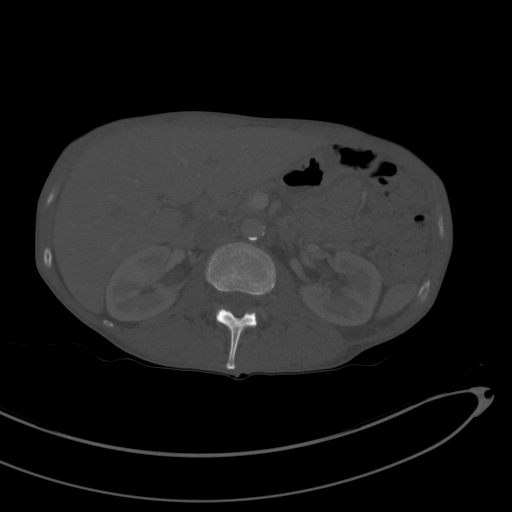
[im 68/95  soft-tissue]
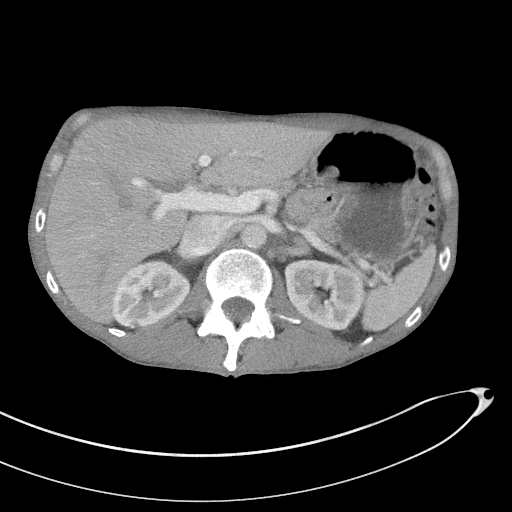
[im 74/95  soft-tissue]
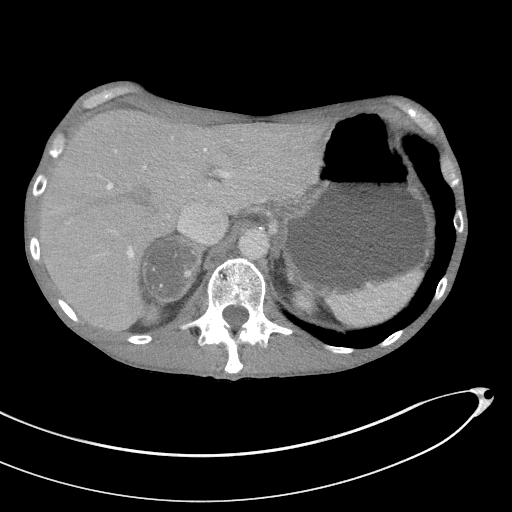
[im 84/95  soft-tissue]
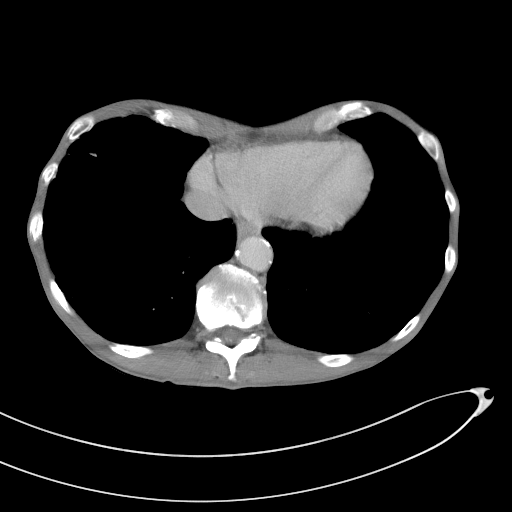
[im 89/95  soft-tissue]
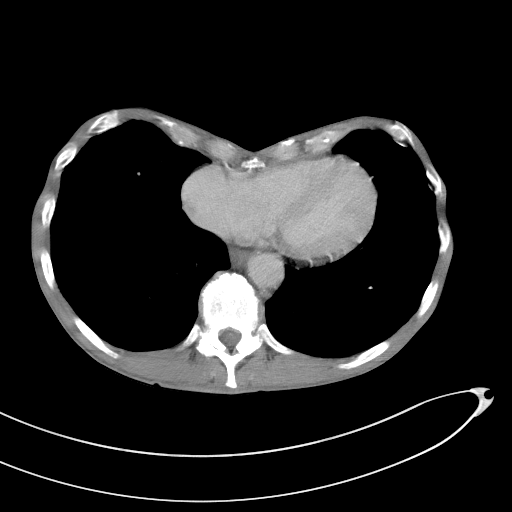

[Series 6: coronal st · coronal · 0.82mm/px · 3 of 83 slices shown]
[im 28/83  soft-tissue]
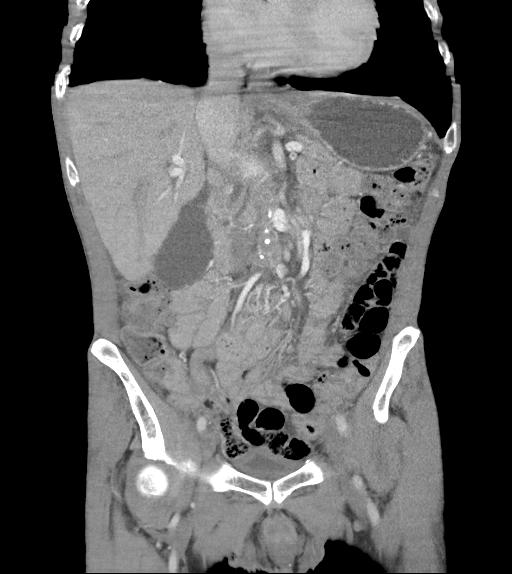
[im 37/83  soft-tissue]
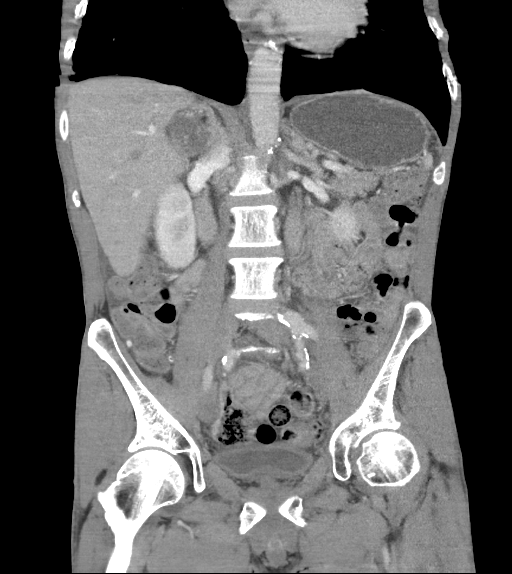
[im 46/83  soft-tissue]
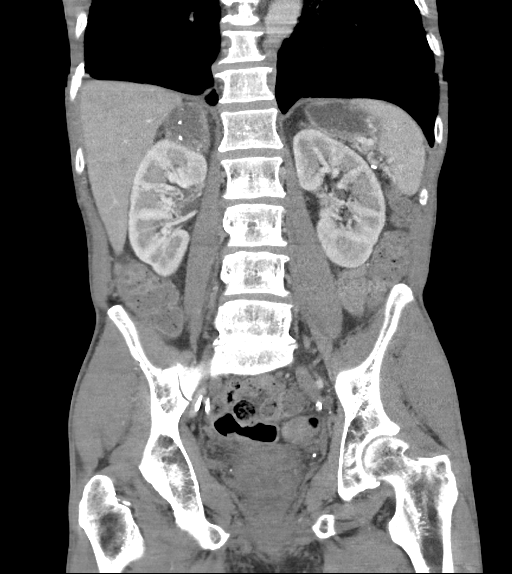

[16 of 46 positions shown; findings below may reference images not displayed]

FINDINGS: Lower chest: Linear opacities in the right middle lobe likely
reflects scar. There is minimal dependent subsegmental atelectasis
in the right lower lobe. There is no focal consolidation or pleural
effusion. The imaged heart is unremarkable. There is pectus
excavatum deformity in the lower chest.

Hepatobiliary: The liver is unremarkable. Small gallstones are
layering in the gallbladder. There is no evidence of acute
cholecystitis. There is no biliary ductal dilatation.

Pancreas: Unremarkable.

Spleen: Unremarkable.

Adrenals/Urinary Tract: There is a 4.9 cm AP x 4.1 cm TV
heterogeneously enhancing and partially calcified right adrenal
mass, similar to the most recent prior study and present since 9666.
The left adrenal is unremarkable.

There is a subcentimeter hypodense lesion in the left upper pole,
too small to characterize. There is an additional 6 mm lesion with
punctate calcifications in the right upper pole, also too small to
characterize. The kidneys are otherwise unremarkable, with no other
focal lesion, stone, hydronephrosis, or hydroureter. The bladder is
decompressed but grossly unremarkable.

Stomach/Bowel: The distal esophagus is unremarkable. The stomach is
unremarkable. There is no evidence of bowel obstruction. There is no
abnormal bowel wall thickening or inflammatory change. The appendix
is normal. There is no herniated bowel.

Vascular/Lymphatic: There is calcified atherosclerotic plaque
throughout the nonaneurysmal abdominal aorta. The major branch
vessels are patent. The main portal and splenic veins are patent.

Reproductive: The prostate and seminal vesicles are unremarkable.

Other: There is no ascites or free air.

Musculoskeletal: There is degenerative change at L5-S1. There is no
acute osseous abnormality or aggressive osseous lesion.
IMPRESSION: 1. No acute findings in the abdomen or pelvis to explain the
patient's pain. Specifically, no evidence of hernia or appendicitis.
2. Cholelithiasis without evidence of acute cholecystitis.
3. Stable large calcified right adrenal nodule, most likely benign
given presence since [DATE]. Subcentimeter renal lesions, too small to characterize.

Aortic Atherosclerosis ([id number]-E7L.L).

## 2022-04-03 ENCOUNTER — Other Ambulatory Visit (HOSPITAL_COMMUNITY): Payer: Self-pay | Admitting: Psychiatry

## 2022-04-05 NOTE — Telephone Encounter (Signed)
Pt not seen in over a year, must have an appt

## 2022-06-22 DIAGNOSIS — H31011 Macula scars of posterior pole (postinflammatory) (post-traumatic), right eye: Secondary | ICD-10-CM | POA: Diagnosis not present

## 2022-06-25 ENCOUNTER — Other Ambulatory Visit (HOSPITAL_COMMUNITY): Payer: Self-pay | Admitting: Psychiatry

## 2022-06-26 NOTE — Telephone Encounter (Signed)
Call for appt

## 2022-08-18 ENCOUNTER — Other Ambulatory Visit (HOSPITAL_COMMUNITY): Payer: Self-pay | Admitting: Psychiatry

## 2022-10-29 ENCOUNTER — Other Ambulatory Visit (HOSPITAL_COMMUNITY): Payer: Self-pay | Admitting: Psychiatry

## 2022-11-01 ENCOUNTER — Other Ambulatory Visit (HOSPITAL_COMMUNITY): Payer: Self-pay | Admitting: Psychiatry

## 2022-11-04 ENCOUNTER — Other Ambulatory Visit (HOSPITAL_COMMUNITY): Payer: Self-pay | Admitting: Psychiatry

## 2023-05-19 ENCOUNTER — Encounter (HOSPITAL_COMMUNITY): Payer: Self-pay

## 2023-05-19 ENCOUNTER — Emergency Department (HOSPITAL_COMMUNITY)

## 2023-05-19 ENCOUNTER — Emergency Department (HOSPITAL_COMMUNITY): Admitting: Certified Registered"

## 2023-05-19 ENCOUNTER — Other Ambulatory Visit: Payer: Self-pay

## 2023-05-19 ENCOUNTER — Inpatient Hospital Stay (HOSPITAL_COMMUNITY)
Admission: EM | Admit: 2023-05-19 | Discharge: 2023-05-22 | DRG: 392 | Disposition: A | Discharge status: Home / Self Care | Attending: Internal Medicine | Admitting: Internal Medicine

## 2023-05-19 ENCOUNTER — Encounter (HOSPITAL_COMMUNITY)
Admission: EM | Disposition: A | Discharge status: Home / Self Care | Payer: Self-pay | Source: Home / Self Care | Attending: Internal Medicine

## 2023-05-19 DIAGNOSIS — Z87891 Personal history of nicotine dependence: Secondary | ICD-10-CM

## 2023-05-19 DIAGNOSIS — I10 Essential (primary) hypertension: Secondary | ICD-10-CM | POA: Diagnosis not present

## 2023-05-19 DIAGNOSIS — D509 Iron deficiency anemia, unspecified: Secondary | ICD-10-CM | POA: Diagnosis present

## 2023-05-19 DIAGNOSIS — R634 Abnormal weight loss: Secondary | ICD-10-CM | POA: Diagnosis not present

## 2023-05-19 DIAGNOSIS — R001 Bradycardia, unspecified: Secondary | ICD-10-CM | POA: Diagnosis present

## 2023-05-19 DIAGNOSIS — K224 Dyskinesia of esophagus: Secondary | ICD-10-CM

## 2023-05-19 DIAGNOSIS — R631 Polydipsia: Secondary | ICD-10-CM | POA: Diagnosis present

## 2023-05-19 DIAGNOSIS — K319 Disease of stomach and duodenum, unspecified: Secondary | ICD-10-CM | POA: Diagnosis not present

## 2023-05-19 DIAGNOSIS — T182XXA Foreign body in stomach, initial encounter: Secondary | ICD-10-CM

## 2023-05-19 DIAGNOSIS — Z8701 Personal history of pneumonia (recurrent): Secondary | ICD-10-CM

## 2023-05-19 DIAGNOSIS — K220 Achalasia of cardia: Secondary | ICD-10-CM | POA: Diagnosis not present

## 2023-05-19 DIAGNOSIS — K3189 Other diseases of stomach and duodenum: Secondary | ICD-10-CM | POA: Diagnosis present

## 2023-05-19 DIAGNOSIS — E876 Hypokalemia: Secondary | ICD-10-CM | POA: Diagnosis not present

## 2023-05-19 DIAGNOSIS — Z885 Allergy status to narcotic agent status: Secondary | ICD-10-CM

## 2023-05-19 DIAGNOSIS — K2970 Gastritis, unspecified, without bleeding: Secondary | ICD-10-CM | POA: Diagnosis not present

## 2023-05-19 DIAGNOSIS — K2289 Other specified disease of esophagus: Secondary | ICD-10-CM | POA: Diagnosis present

## 2023-05-19 DIAGNOSIS — Z888 Allergy status to other drugs, medicaments and biological substances status: Secondary | ICD-10-CM

## 2023-05-19 DIAGNOSIS — K8020 Calculus of gallbladder without cholecystitis without obstruction: Secondary | ICD-10-CM | POA: Diagnosis present

## 2023-05-19 DIAGNOSIS — D1779 Benign lipomatous neoplasm of other sites: Secondary | ICD-10-CM | POA: Diagnosis present

## 2023-05-19 DIAGNOSIS — I444 Left anterior fascicular block: Secondary | ICD-10-CM | POA: Diagnosis present

## 2023-05-19 DIAGNOSIS — Z79899 Other long term (current) drug therapy: Secondary | ICD-10-CM

## 2023-05-19 DIAGNOSIS — I440 Atrioventricular block, first degree: Secondary | ICD-10-CM | POA: Diagnosis present

## 2023-05-19 DIAGNOSIS — D649 Anemia, unspecified: Secondary | ICD-10-CM | POA: Diagnosis not present

## 2023-05-19 DIAGNOSIS — Z681 Body mass index (BMI) 19 or less, adult: Secondary | ICD-10-CM

## 2023-05-19 DIAGNOSIS — W19XXXA Unspecified fall, initial encounter: Secondary | ICD-10-CM | POA: Diagnosis present

## 2023-05-19 DIAGNOSIS — K222 Esophageal obstruction: Secondary | ICD-10-CM

## 2023-05-19 DIAGNOSIS — R632 Polyphagia: Secondary | ICD-10-CM | POA: Diagnosis present

## 2023-05-19 DIAGNOSIS — Z9889 Other specified postprocedural states: Secondary | ICD-10-CM

## 2023-05-19 DIAGNOSIS — Z860100 Personal history of colon polyps, unspecified: Secondary | ICD-10-CM

## 2023-05-19 DIAGNOSIS — K2990 Gastroduodenitis, unspecified, without bleeding: Secondary | ICD-10-CM | POA: Diagnosis present

## 2023-05-19 DIAGNOSIS — F32A Depression, unspecified: Secondary | ICD-10-CM | POA: Diagnosis present

## 2023-05-19 DIAGNOSIS — I493 Ventricular premature depolarization: Secondary | ICD-10-CM | POA: Diagnosis present

## 2023-05-19 DIAGNOSIS — Z8719 Personal history of other diseases of the digestive system: Secondary | ICD-10-CM

## 2023-05-19 DIAGNOSIS — R1310 Dysphagia, unspecified: Principal | ICD-10-CM

## 2023-05-19 DIAGNOSIS — Z9049 Acquired absence of other specified parts of digestive tract: Secondary | ICD-10-CM

## 2023-05-19 DIAGNOSIS — Y92099 Unspecified place in other non-institutional residence as the place of occurrence of the external cause: Secondary | ICD-10-CM

## 2023-05-19 DIAGNOSIS — Z7982 Long term (current) use of aspirin: Secondary | ICD-10-CM

## 2023-05-19 DIAGNOSIS — F411 Generalized anxiety disorder: Secondary | ICD-10-CM | POA: Diagnosis present

## 2023-05-19 DIAGNOSIS — S069X9A Unspecified intracranial injury with loss of consciousness of unspecified duration, initial encounter: Secondary | ICD-10-CM | POA: Diagnosis present

## 2023-05-19 DIAGNOSIS — R627 Adult failure to thrive: Secondary | ICD-10-CM | POA: Diagnosis present

## 2023-05-19 HISTORY — PX: ESOPHAGOGASTRODUODENOSCOPY: SHX5428

## 2023-05-19 HISTORY — DX: Esophageal obstruction: K22.2

## 2023-05-19 HISTORY — DX: Achalasia of cardia: K22.0

## 2023-05-19 HISTORY — DX: Gastritis, unspecified, without bleeding: K29.70

## 2023-05-19 LAB — COMPREHENSIVE METABOLIC PANEL WITH GFR
ALT: 14 U/L (ref 0–44)
AST: 21 U/L (ref 15–41)
Albumin: 3.8 g/dL (ref 3.5–5.0)
Alkaline Phosphatase: 49 U/L (ref 38–126)
Anion gap: 7 (ref 5–15)
BUN: 16 mg/dL (ref 8–23)
CO2: 24 mmol/L (ref 22–32)
Calcium: 9 mg/dL (ref 8.9–10.3)
Chloride: 105 mmol/L (ref 98–111)
Creatinine, Ser: 0.6 mg/dL — ABNORMAL LOW (ref 0.61–1.24)
GFR, Estimated: >60 mL/min (ref >60)
Glucose, Bld: 127 mg/dL — ABNORMAL HIGH (ref 70–99)
Potassium: 3.3 mmol/L — ABNORMAL LOW (ref 3.5–5.1)
Sodium: 136 mmol/L (ref 135–145)
Total Bilirubin: 0.5 mg/dL (ref 0.0–1.2)
Total Protein: 6.7 g/dL (ref 6.5–8.1)

## 2023-05-19 LAB — CBC
HCT: 33.1 % — ABNORMAL LOW (ref 39.0–52.0)
Hemoglobin: 10.8 g/dL — ABNORMAL LOW (ref 13.0–17.0)
MCH: 29.3 pg (ref 26.0–34.0)
MCHC: 32.6 g/dL (ref 30.0–36.0)
MCV: 89.9 fL (ref 80.0–100.0)
Platelets: 306 10*3/uL (ref 150–400)
RBC: 3.68 MIL/uL — ABNORMAL LOW (ref 4.22–5.81)
RDW: 14.6 % (ref 11.5–15.5)
WBC: 9.3 10*3/uL (ref 4.0–10.5)
nRBC: 0 % (ref 0.0–0.2)

## 2023-05-19 LAB — FERRITIN: Ferritin: 34 ng/mL (ref 24–336)

## 2023-05-19 LAB — RETICULOCYTES
Immature Retic Fract: 6.3 % (ref 2.3–15.9)
RBC.: 3.67 MIL/uL — ABNORMAL LOW (ref 4.22–5.81)
Retic Count, Absolute: 24.6 10*3/uL (ref 19.0–186.0)
Retic Ct Pct: 0.7 % (ref 0.4–3.1)

## 2023-05-19 LAB — IRON AND TIBC
Iron: 52 ug/dL (ref 45–182)
Saturation Ratios: 16 % — ABNORMAL LOW (ref 17.9–39.5)
TIBC: 318 ug/dL (ref 250–450)
UIBC: 266 ug/dL

## 2023-05-19 LAB — VITAMIN B12: Vitamin B-12: 737 pg/mL (ref 180–914)

## 2023-05-19 LAB — TSH: TSH: 0.868 u[IU]/mL (ref 0.350–4.500)

## 2023-05-19 LAB — FOLATE: Folate: 9.5 ng/mL (ref >5.9)

## 2023-05-19 LAB — LIPASE, BLOOD: Lipase: 29 U/L (ref 11–51)

## 2023-05-19 LAB — PROTIME-INR
INR: 1.1 (ref 0.8–1.2)
Prothrombin Time: 14.8 s (ref 11.4–15.2)

## 2023-05-19 LAB — MAGNESIUM: Magnesium: 2 mg/dL (ref 1.7–2.4)

## 2023-05-19 SURGERY — EGD (ESOPHAGOGASTRODUODENOSCOPY)
Anesthesia: General

## 2023-05-19 MED ORDER — SODIUM CHLORIDE 0.9 % IV BOLUS
1000.0000 mL | Freq: Once | INTRAVENOUS | Status: AC
  Administered 2023-05-19: 1000 mL via INTRAVENOUS

## 2023-05-19 MED ORDER — LIDOCAINE HCL (PF) 2 % IJ SOLN
INTRAMUSCULAR | Status: DC | PRN
  Administered 2023-05-19: 70 mg via INTRADERMAL

## 2023-05-19 MED ORDER — PANTOPRAZOLE SODIUM 40 MG IV SOLR
40.0000 mg | Freq: Every day | INTRAVENOUS | Status: DC
  Administered 2023-05-19 – 2023-05-22 (×4): 40 mg via INTRAVENOUS
  Filled 2023-05-19 (×4): qty 10

## 2023-05-19 MED ORDER — ONDANSETRON HCL 4 MG/2ML IJ SOLN
4.0000 mg | Freq: Once | INTRAMUSCULAR | Status: AC
  Administered 2023-05-19: 4 mg via INTRAVENOUS
  Filled 2023-05-19: qty 2

## 2023-05-19 MED ORDER — ENSURE ENLIVE PO LIQD
237.0000 mL | Freq: Two times a day (BID) | ORAL | Status: DC
  Administered 2023-05-20 – 2023-05-21 (×3): 237 mL via ORAL

## 2023-05-19 MED ORDER — MORPHINE SULFATE (PF) 4 MG/ML IV SOLN
4.0000 mg | Freq: Once | INTRAVENOUS | Status: AC
  Administered 2023-05-19: 4 mg via INTRAVENOUS
  Filled 2023-05-19: qty 1

## 2023-05-19 MED ORDER — HYDROCODONE-ACETAMINOPHEN 5-325 MG PO TABS
1.0000 | ORAL_TABLET | ORAL | Status: DC | PRN
  Administered 2023-05-19 – 2023-05-21 (×5): 1 via ORAL
  Filled 2023-05-19 (×7): qty 1

## 2023-05-19 MED ORDER — SODIUM CHLORIDE 0.9 % IV SOLN: Freq: Once | INTRAVENOUS | Status: AC

## 2023-05-19 MED ORDER — LACTATED RINGERS IV SOLN: INTRAVENOUS | Status: DC | PRN

## 2023-05-19 MED ORDER — GLYCOPYRROLATE PF 0.2 MG/ML IJ SOSY
PREFILLED_SYRINGE | INTRAMUSCULAR | Status: DC | PRN
  Administered 2023-05-19: .2 mg via INTRAVENOUS

## 2023-05-19 MED ORDER — MELATONIN 3 MG PO TABS
3.0000 mg | ORAL_TABLET | Freq: Every day | ORAL | Status: DC
  Administered 2023-05-20 – 2023-05-21 (×2): 3 mg via ORAL
  Filled 2023-05-19 (×3): qty 1

## 2023-05-19 MED ORDER — POTASSIUM CHLORIDE 10 MEQ/100ML IV SOLN
10.0000 meq | INTRAVENOUS | Status: DC
  Administered 2023-05-19: 10 meq via INTRAVENOUS
  Filled 2023-05-19 (×2): qty 100

## 2023-05-19 MED ORDER — PROPOFOL 10 MG/ML IV BOLUS
INTRAVENOUS | Status: DC | PRN
  Administered 2023-05-19: 70 mg via INTRAVENOUS
  Administered 2023-05-19: 30 mg via INTRAVENOUS
  Administered 2023-05-19 (×2): 50 mg via INTRAVENOUS

## 2023-05-19 MED ORDER — POTASSIUM CHLORIDE CRYS ER 20 MEQ PO TBCR
40.0000 meq | EXTENDED_RELEASE_TABLET | Freq: Once | ORAL | Status: AC
  Administered 2023-05-19: 40 meq via ORAL
  Filled 2023-05-19: qty 2

## 2023-05-19 MED ORDER — PANTOPRAZOLE SODIUM 40 MG IV SOLR
40.0000 mg | Freq: Once | INTRAVENOUS | Status: AC
  Administered 2023-05-19: 40 mg via INTRAVENOUS
  Filled 2023-05-19: qty 10

## 2023-05-19 MED ORDER — EPHEDRINE 5 MG/ML INJ
INTRAVENOUS | Status: AC
  Filled 2023-05-19: qty 5

## 2023-05-19 MED ORDER — PROPOFOL 500 MG/50ML IV EMUL
INTRAVENOUS | Status: DC | PRN
  Administered 2023-05-19: 150 ug/kg/min via INTRAVENOUS

## 2023-05-19 MED ORDER — GLYCOPYRROLATE PF 0.2 MG/ML IJ SOSY
PREFILLED_SYRINGE | INTRAMUSCULAR | Status: AC
  Filled 2023-05-19: qty 1

## 2023-05-19 MED ORDER — IOHEXOL 300 MG/ML  SOLN
100.0000 mL | Freq: Once | INTRAMUSCULAR | Status: AC | PRN
Start: 2023-05-19 — End: 2023-05-19
  Administered 2023-05-19: 100 mL via INTRAVENOUS

## 2023-05-19 MED ORDER — LACTATED RINGERS IV SOLN: INTRAVENOUS | Status: DC

## 2023-05-19 MED ORDER — EPHEDRINE SULFATE-NACL 50-0.9 MG/10ML-% IV SOSY
PREFILLED_SYRINGE | INTRAVENOUS | Status: DC | PRN
  Administered 2023-05-19 (×2): 5 mg via INTRAVENOUS

## 2023-05-19 NOTE — Transfer of Care (Signed)
 Immediate Anesthesia Transfer of Care Note  Patient: Mike Moran  Procedure(s) Performed: EGD (ESOPHAGOGASTRODUODENOSCOPY)  Patient Location: PACU  Anesthesia Type:General  Level of Consciousness: drowsy  Airway & Oxygen Therapy: Patient Spontanous Breathing and Patient connected to nasal cannula oxygen  Post-op Assessment: Report given to RN and Post -op Vital signs reviewed and stable  Post vital signs: Reviewed and stable  Last Vitals:  Vitals Value Taken Time  BP 87/39 05/19/23 1325  Temp 97.8   Pulse 61 05/19/23 1328  Resp 18 05/19/23 1328  SpO2 100 % 05/19/23 1328  Vitals shown include unfiled device data.  Last Pain:  Vitals:   05/19/23 1259  TempSrc:   PainSc: 8          Complications: No notable events documented.

## 2023-05-19 NOTE — Progress Notes (Signed)
 Re: Pt with c/o epigastric pain, does have vicodin ordered but everything he puts in his mouth it comes right back up, can we get an IV pain med ordered?

## 2023-05-19 NOTE — H&P (View-Only) (Signed)
 Gastroenterology Consult   Referring Provider: No ref. provider found Primary Care Physician:  Pcp, No Primary Gastroenterologist: Vista Lawman, MD (previously Dr. Karilyn Cota)   Patient ID: Mike Moran; [id number]; [birth year]   Admit date: 05/19/2023  LOS: 0 days   Date of Consultation: 05/19/2023  Reason for Consultation:  dysphagia, odynophagia  History of Present Illness   Mike Moran is a 68 y.o. year old male with history of anxiety/depression with prior SI in 2017, appendectomy in 2022, perirectal abscess in 2008 who presented with 6 months of dysphagia and odynophagia after syncopal episode at home. GI consulted for further evaluation.  ED COURSE: Per reports to ED physician he has had pain with swallowing anything and feels like it gets stuck in his chest and then he coughs and tries to force it down or up and feels like he is choking. Constant polyphagia and polydipsia. Reported weight loss as well. Reported syncopal episode this mornign and fell backwards and hit his head with + LOC. He reported needing esophageal dilation about 10 years ago.   CT A/P: -Dilated esophagus to level of carina. 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  Melena or brbpr: Denies  Has good appetite but having frequent regurge with solids and liquids. Denies taking any regular medications. No PPI or H2 blocker. Denies reflux symptoms. Reports about a 40lb weight loss since this has been going on (at least 6 months - possibly longer). Used to weight 170-175, now currently 130s. Occasionally able to keep down soft candy bar but most solid and even soft food is hard to tolerate. Has chest pain only when food gets stuck and shortness of breath and anxiety comes with this. Reiterates the story he  told ED physician in regards to syncopal episode.   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   EGD Feb 2008: -esophageal ring s/p dilation -normal stomach and duodenum.  Colonoscopy 2008: - two polyps (cecum and transverse) -small internal/external hemorrhoids  History of perirectal abscess: 10 oclock position around anus. Drained in July and November 2008  Past Medical History:  Diagnosis Date   Anxiety    Depression     Past Surgical History:  Procedure Laterality Date   INGUINAL HERNIA REPAIR Right 11/05/2020   Procedure: HERNIA REPAIR INGUINAL ADULT WITH MESH;  Surgeon: Franky Macho, MD;  Location: [facility];  Service: General;  Laterality: Right;   LAPAROSCOPIC APPENDECTOMY N/A 01/04/2021   Procedure: APPENDECTOMY LAPAROSCOPIC;  Surgeon: Franky Macho, MD;  Location: [facility];  Service: General;  Laterality: N/A;    Prior to Admission medications   Medication Sig Start Date End Date Taking? Authorizing Provider  amoxicillin-clavulanate (AUGMENTIN) 875-125 MG tablet Take 1 tablet by mouth 2 (two) times daily. 01/07/21   Franky Macho, MD  aspirin EC 81 MG tablet Take 1 tablet (81 mg total) by mouth daily. For heart health 08/04/15   Armandina Stammer I, NP  DULoxetine (CYMBALTA) 60 MG capsule TAKE (1) CAPSULE BY MOUTH ONCE DAILY. 04/05/22   Myrlene Broker, MD  hydrOXYzine (ATARAX) 25 MG tablet TAKE ONE TABLET BY MOUTH EVERY 6 HOURS AS NEEDED FOR ANXIETY. 08/18/22   Myrlene Broker, MD  Multiple Vitamins-Minerals (MULTIVITAMIN WITH MINERALS) tablet Take 1 tablet by mouth daily.    [provider]  ondansetron (  ZOFRAN) 4 MG tablet Take 1 tablet (4 mg total) by mouth every 8 (eight) hours as needed for nausea or vomiting. 01/07/21   Franky Macho, MD  oxyCODONE-acetaminophen (PERCOCET) 7.5-325 MG tablet Take 1 tablet by mouth every 4 (four) hours as needed for severe pain. 01/13/21   Franky Macho, MD  pantoprazole (PROTONIX) 40 MG tablet Take 1 tablet (40 mg total) by mouth daily.  01/07/21 01/07/22  Franky Macho, MD  risperiDONE (RISPERDAL) 0.5 MG tablet TAKE 1 TABLET BY MOUTH AT BEDTIME 04/05/22   Myrlene Broker, MD  traZODone (DESYREL) 50 MG tablet TAKE (1) TABLET BY MOUTH AT BEDTIME AS NEEDED. MAY REPEAT DOSE (1) TIME IF NEEDED FOR SLEEP. 06/26/22   Myrlene Broker, MD    No current facility-administered medications for this encounter.   Current Outpatient Medications  Medication Sig Dispense Refill   amoxicillin-clavulanate (AUGMENTIN) 875-125 MG tablet Take 1 tablet by mouth 2 (two) times daily. 14 tablet 0   aspirin EC 81 MG tablet Take 1 tablet (81 mg total) by mouth daily. For heart health     DULoxetine (CYMBALTA) 60 MG capsule TAKE (1) CAPSULE BY MOUTH ONCE DAILY. 90 capsule 0   hydrOXYzine (ATARAX) 25 MG tablet TAKE ONE TABLET BY MOUTH EVERY 6 HOURS AS NEEDED FOR ANXIETY. 90 tablet 0   Multiple Vitamins-Minerals (MULTIVITAMIN WITH MINERALS) tablet Take 1 tablet by mouth daily.     ondansetron (ZOFRAN) 4 MG tablet Take 1 tablet (4 mg total) by mouth every 8 (eight) hours as needed for nausea or vomiting. 20 tablet 1   oxyCODONE-acetaminophen (PERCOCET) 7.5-325 MG tablet Take 1 tablet by mouth every 4 (four) hours as needed for severe pain. 40 tablet 0   pantoprazole (PROTONIX) 40 MG tablet Take 1 tablet (40 mg total) by mouth daily. 30 tablet 1   risperiDONE (RISPERDAL) 0.5 MG tablet TAKE 1 TABLET BY MOUTH AT BEDTIME 90 tablet 0   traZODone (DESYREL) 50 MG tablet TAKE (1) TABLET BY MOUTH AT BEDTIME AS NEEDED. MAY REPEAT DOSE (1) TIME IF NEEDED FOR SLEEP. 60 tablet 0    Allergies as of 05/19/2023 - Review Complete 05/19/2023  Allergen Reaction Noted   Codeine Nausea And Vomiting 11/28/2013   Lexapro [escitalopram oxalate] Other (See Comments) 01/09/2014    History reviewed. No pertinent family history.  Social History   Socioeconomic History   Marital status: Married    Spouse name: Not on file   Number of children: Not on file   Years of education:  Not on file   Highest education level: Not on file  Occupational History   Not on file  Tobacco Use   Smoking status: Former   Smokeless tobacco: Never  Vaping Use   Vaping status: Never Used  Substance and Sexual Activity   Alcohol use: Yes    Alcohol/week: 0.0 standard drinks of alcohol    Comment: occ- pt denies SA use   Drug use: No   Sexual activity: Not on file  Other Topics Concern   Not on file  Social History Narrative   Not on file   Social Drivers of Health   Financial Resource Strain: Not on file  Food Insecurity: Not on file  Transportation Needs: Not on file  Physical Activity: Not on file  Stress: Not on file  Social Connections: Not on file  Intimate Partner Violence: Not on file   Review of Systems   Gen: + weight loss. Denies any fever, chills, loss of appetite CV:  Denies chest pain, heart palpitations, syncope, edema  Resp: Denies shortness of breath with rest, cough, wheezing, coughing up blood, and pleurisy. GI: + dysphagia or odynophagia. GU : Denies urinary burning, blood in urine, urinary frequency, and urinary incontinence. MS: Denies joint pain, limitation of movement, swelling, cramps, and atrophy.  Derm: Denies rash, itching, dry skin, hives. Psych: + anxiety, depression. Denies memory loss, hallucinations, and confusion. Heme: Denies bruising or bleeding. Neuro:  Denies any headaches, dizziness, paresthesias, shaking  Physical Exam   Vital Signs in last 24 hours: Temp:  [98 F (36.7 C)-98.3 F (36.8 C)] 98 F (36.7 C) (04/10 0952) Pulse Rate:  [47-75] 52 (04/10 1000) Resp:  [9-21] 15 (04/10 1000) BP: (100-130)/(64-102) 102/64 (04/10 1000) SpO2:  [80 %-98 %] 95 % (04/10 1000)   General: Drowsy,  Well-developed, pleasant and cooperative in NAD Head:  Normocephalic and atraumatic. Eyes:  Sclera clear, no icterus.   Conjunctiva pink. Ears:  Normal auditory acuity. Mouth:  No deformity or lesions, dentition normal. Neck:  Supple; no  masses Lungs:  Clear throughout to auscultation.   No wheezes, crackles, or rhonchi. No acute distress. Heart:  Regular rate and rhythm; no murmurs, clicks, rubs,  or gallops. Abdomen:  Soft, nontender and nondistended. No masses, hepatosplenomegaly or hernias noted. Normal bowel sounds, without guarding, and without rebound.   Rectal: deferred   Msk:  Symmetrical without gross deformities. Normal posture. Neurologic:  Alert and  oriented x4. Skin:  Intact without significant lesions or rashes. Psych:  Drowsy, anxious. Normal mood and affect.  Intake/Output from previous day: No intake/output data recorded. Intake/Output this shift: Total I/O In: 1000 [IV Piggyback:1000] Out: 350 [Urine:350]  Wt Readings from Last 3 Encounters:  01/13/21 58.1 kg  01/04/21 65.8 kg  11/13/20 56.2 kg   Labs/Studies   Recent Labs Recent Labs    05/19/23 0610  WBC 9.3  HGB 10.8*  HCT 33.1*  PLT 306   BMET Recent Labs    05/19/23 0610  NA 136  K 3.3*  CL 105  CO2 24  GLUCOSE 127*  BUN 16  CREATININE 0.60*  CALCIUM 9.0   LFT Recent Labs    05/19/23 0610  PROT 6.7  ALBUMIN 3.8  AST 21  ALT 14  ALKPHOS 49  BILITOT 0.5   PT/INR Recent Labs    05/19/23 0610  LABPROT 14.8  INR 1.1   Hepatitis Panel No results for input(s): "HEPBSAG", "HCVAB", "HEPAIGM", "HEPBIGM" in the last 72 hours. C-Diff No results for input(s): "CDIFFTOX" in the last 72 hours.  Radiology/Studies CT CHEST ABDOMEN PELVIS W CONTRAST Result Date: 05/19/2023 CLINICAL DATA:  Evaluate for metastatic disease. Loss of weight. Complains of difficulty swallowing. EXAM: CT CHEST, ABDOMEN, AND PELVIS WITH CONTRAST TECHNIQUE: Multidetector CT imaging of the chest, abdomen and pelvis was performed following the standard protocol during bolus administration of intravenous contrast. RADIATION DOSE REDUCTION: This exam was performed according to the departmental dose-optimization program which includes automated exposure  control, adjustment of the mA and/or kV according to patient size and/or use of iterative reconstruction technique. CONTRAST:  OMNIPAQUE IOHEXOL 300 MG/ML  SOLN COMPARISON:  CT [facility] 01/04/2021 and CT angio chest 03/25/2020 FINDINGS: CT CHEST FINDINGS Cardiovascular: Heart size is normal. Aortic atherosclerosis and coronary artery calcifications. No pericardial effusion. Mediastinum/Nodes: Thyroid gland appears normal. Trachea appears patent and midline. Dilated patulous esophagus is identified to the level of the carina. No enlarged mediastinal or hilar lymph nodes. Lungs/Pleura: No pleural effusion, airspace consolidation, atelectasis  or pneumothorax. Mild emphysema with diffuse bronchial wall thickening. Scarring noted within both lung bases. There are several, bilateral, upper lobe punctate lung nodules. Within the left apex nodule is unchanged measuring 3 mm, image 19/4. Punctate nodule in the periphery of the right upper lobe is unchanged measuring 2 mm, image 38/4. Punctate nodule in the left upper lobe measuring 2 mm is also unchanged, image 38/4. These are compatible with benign nodules require no further follow-up. Musculoskeletal: No chest wall mass or suspicious bone lesions identified. CT ABDOMEN PELVIS FINDINGS Hepatobiliary: No suspicious liver lesions. Tiny stones within the dependent portion of the gallbladder. No wall thickening or inflammation. No bile duct dilatation. None Pancreas: Unremarkable. No pancreatic ductal dilatation or surrounding inflammatory changes. Spleen: Normal in size without focal abnormality. Adrenals/Urinary Tract: Mass in the right adrenal gland measures 5.1 by 4.2 by 4.7 cm. There is multiple scattered calcifications identified within this mass. Small focus of macroscopic fat is identified within the mass, image 64/2. Centrally the mass appears low attenuation with peripheral rim of increased soft tissue. On the examination from 03/28/2020 this measured 4.9 x 3.5 cm.  Normal left adrenal gland. No kidney mass, nephrolithiasis or signs of obstructive uropathy. Urinary bladder is normal. Stomach/Bowel: Stomach is nondistended. No pathologic dilatation of the large or small bowel loops. Mild to moderate stool burden is noted within the colon and rectum. No bowel wall thickening or inflammation. Status post appendectomy. Vascular/Lymphatic: Extensive aortic atherosclerosis. No aneurysm. Patent abdominal vascularity. No abdominal adenopathy. Reproductive: Prostate is unremarkable. Other: No free fluid or fluid collections. No signs of pneumoperitoneum. Musculoskeletal: No acute or significant osseous findings. L5-S1 degenerative disc disease. IMPRESSION: 1. No acute findings within the chest, abdomen or pelvis. No mass or adenopathy noted within the chest, abdomen or pelvis. 2. Dilated patulous esophagus is identified to the level of the carina. 3. Right adrenal gland mass containing foci of macroscopic fat is again noted mildly increased in size from previous exam. 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 4. Cholelithiasis. 5. Coronary artery calcifications. 6. Aortic Atherosclerosis (ICD10-I70.0) and Emphysema (ICD10-J43.9). Electronically Signed   By: Signa Kell M.D.   On: 05/19/2023 08:15   CT HEAD WO CONTRAST ( ) Result Date: 05/19/2023 CLINICAL DATA:  Head trauma, moderate to severe. Difficulty eating for several months because E insert up choking. EXAM: CT HEAD WITHOUT CONTRAST TECHNIQUE: Contiguous axial images were obtained from the base of the skull through the vertex without intravenous contrast. RADIATION DOSE REDUCTION: This exam was performed according to the departmental dose-optimization program which includes automated exposure control, adjustment of the mA and/or kV according to patient size and/or use of iterative reconstruction technique. COMPARISON:  11/28/2013 FINDINGS: Brain: No evidence of acute infarction, hemorrhage, hydrocephalus,  extra-axial collection or mass lesion/mass effect. Vascular: No hyperdense vessel or unexpected calcification. Skull: Normal. Negative for fracture or focal lesion. Sinuses/Orbits: Paranasal sinuses and mastoid air cells are clear. Other: None. IMPRESSION: No acute intracranial abnormalities. Electronically Signed   By: Signa Kell M.D.   On: 05/19/2023 08:01   Assessment   Mike Moran is a 68 y.o. year old male with history of anxiety/depression with prior SI in 2017, appendectomy in 2022, perirectal abscess in 2008 who presented with 6 months of dysphagia and odynophagia after syncopal episode at home. GI consulted for further evaluation.  Dysphagia, Odynophagia: - EGD in 2008 with dilation for esophageal ring - symptoms occurring for > 6 months -symptoms resulting in weight loss given frequent regurgitation (~  40lbs) -occurring with solids and liquids -no nsaids, not on ppi -denies reflux symptoms -occasional mucous production in mornings.   Anemia: - per review of labs this appears to be chronic - hgb 10.8 (9-12 in 2022) - Denies any sign of GI bleed - weight loss likely secondary to poor po intake from dysphagia.   Hypokalemia: -K 3.3, likely secondary to poor p.o intake -Will replace with IV given intolerant to p.o currently.   Bradycardia: -could be secondary to poor p.o. intake plus active lifestyle -12 lead with 1st degree AV block -management per hospitalist  Plan / Recommendations   NPO IV PPI daily EGD with dilation today with Dr. Tasia Catchings Trial of diet post procedure pending findings Due for outpatient surveillance colonoscopy   05/19/2023, 10:49 AM  Brooke Bonito, MSN, FNP-BC, AGACNP-BC [facility]

## 2023-05-19 NOTE — H&P (Addendum)
 [facility] H&P   Patient Demographics:    Mike Moran, is a 68 y.o. male  MRN: [id number]   DOB - October 12, 1955  Admit Date - 05/19/2023  Outpatient Primary MD for the patient is Pcp, No  Referring MD/NP/PA: Dr Charm Barges  Patient coming from: home  Chief Complaint  Patient presents with   Dysphagia      HPI:    Mike Moran  is a 68 y.o. male, with medical history for anxiety, depression, appendectomy, perirectal abscess, presents to ED secondary to complaints of dysphagia and odynophagia, and syncopal episode at home, patient reports dysphagia, difficulty swallowing over a year or so, has been progressive, denies any chest pain, shortness of breath, but he does report syncopal event today, upon further questioning patient reports this syncopal events happening when he is eating, and choking when he had total loss of consciousness. 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    Review of systems:     A full 10 point Review of Systems was done, except as stated above, all other Review of Systems were negative.   With Past History of the following :    Past Medical History:  Diagnosis Date   Anxiety    Depression       Past Surgical History:  Procedure Laterality Date   INGUINAL HERNIA REPAIR Right 11/05/2020   Procedure: HERNIA REPAIR INGUINAL ADULT WITH MESH;  Surgeon: Franky Macho, MD;  Location: [facility];  Service: General;  Laterality: Right;   LAPAROSCOPIC APPENDECTOMY N/A 01/04/2021   Procedure: APPENDECTOMY LAPAROSCOPIC;  Surgeon: Franky Macho, MD;  Location: [facility];  Service: General;  Laterality: N/A;      Social  History:     Social History   Tobacco Use   Smoking status: Former   Smokeless tobacco: Never  Substance Use Topics   Alcohol use: Yes    Alcohol/week: 0.0 standard drinks of alcohol    Comment: occ- pt denies SA use      Family History :    History reviewed. No pertinent family history.    Home Medications:   Prior to Admission medications   Medication Sig Start Date End Date Taking? Authorizing Provider  aspirin EC 325 MG tablet Take 325 mg by mouth daily.   Yes [provider]  ibuprofen (ADVIL) 200 MG tablet Take 200 mg by mouth every 6 (six) hours as needed for fever, headache or mild pain (pain score 1-3).   Yes [provider]  Multiple Vitamins-Minerals (MULTIVITAMIN WITH MINERALS) tablet Take 1 tablet by mouth daily.   Yes [provider]     Allergies:  Allergies  Allergen Reactions   Codeine Nausea And Vomiting   Lexapro [Escitalopram Oxalate] Other (See Comments)    lethargic     Physical Exam:   Vitals  Blood pressure 131/80, pulse (!) 52, temperature 98.1 F (36.7 C), resp. rate 16, height 6' (1.829 m), weight 52.2 kg, SpO2 99%.   1. General Developed male, laying in bed, no apparent distress  2. Normal affect and insight, Not Suicidal or Homicidal, Awake Alert, Oriented X 3.  3. No F.N deficits, ALL C.Nerves Intact, Strength 5/5 all 4 extremities, Sensation intact all 4 extremities, Plantars down going.  4. Ears and Eyes appear Normal, Conjunctivae clear, PERRLA. Moist Oral Mucosa.  5. Supple Neck, No JVD, No cervical lymphadenopathy appriciated, No Carotid Bruits.  6. Symmetrical Chest wall movement, Good air movement bilaterally, CTAB.  7. RRR, No Gallops, Rubs or Murmurs, No Parasternal Heave.  8. Positive Bowel Sounds, Abdomen Soft, No tenderness, No organomegaly appriciated,No rebound -guarding or rigidity.  9.  No Cyanosis, Normal Skin Turgor, No Skin Rash or Bruise.  10. Good muscle tone,  joints  appear normal , no effusions, Normal ROM.    Data Review:    CBC Recent Labs  Lab 05/19/23 0610  WBC 9.3  HGB 10.8*  HCT 33.1*  PLT 306  MCV 89.9  MCH 29.3  MCHC 32.6  RDW 14.6   ------------------------------------------------------------------------------------------------------------------  Chemistries  Recent Labs  Lab 05/19/23 0610  NA 136  K 3.3*  CL 105  CO2 24  GLUCOSE 127*  BUN 16  CREATININE 0.60*  CALCIUM 9.0  AST 21  ALT 14  ALKPHOS 49  BILITOT 0.5   ------------------------------------------------------------------------------------------------------------------ estimated creatinine clearance is 64.3 mL/min (A) (by C-G formula based on SCr of 0.6 mg/dL (L)). ------------------------------------------------------------------------------------------------------------------ No results for input(s): "TSH", "T4TOTAL", "T3FREE", "THYROIDAB" in the last 72 hours.  Invalid input(s): "FREET3"  Coagulation profile Recent Labs  Lab 05/19/23 0610  INR 1.1   ------------------------------------------------------------------------------------------------------------------- No results for input(s): "DDIMER" in the last 72 hours. -------------------------------------------------------------------------------------------------------------------  Cardiac Enzymes No results for input(s): "CKMB", "TROPONINI", "MYOGLOBIN" in the last 168 hours.  Invalid input(s): "CK" ------------------------------------------------------------------------------------------------------------------    Component Value Date/Time   BNP 189.0 (H) 03/25/2020 0205     ---------------------------------------------------------------------------------------------------------------  Urinalysis    Component Value Date/Time   COLORURINE YELLOW 01/04/2021 0409   APPEARANCEUR CLEAR 01/04/2021 0409   LABSPEC 1.015 01/04/2021 0409   PHURINE 6.5 01/04/2021 0409   GLUCOSEU NEGATIVE  01/04/2021 0409   HGBUR NEGATIVE 01/04/2021 0409   BILIRUBINUR NEGATIVE 01/04/2021 0409   KETONESUR NEGATIVE 01/04/2021 0409   PROTEINUR 30 (A) 01/04/2021 0409   UROBILINOGEN 0.2 01/09/2014 1239   NITRITE NEGATIVE 01/04/2021 0409   LEUKOCYTESUR NEGATIVE 01/04/2021 0409    ----------------------------------------------------------------------------------------------------------------   Imaging Results:    CT CHEST ABDOMEN PELVIS W CONTRAST Result Date: 05/19/2023 CLINICAL DATA:  Evaluate for metastatic disease. Loss of weight. Complains of difficulty swallowing. EXAM: CT CHEST, ABDOMEN, AND PELVIS WITH CONTRAST TECHNIQUE: Multidetector CT imaging of the chest, abdomen and pelvis was performed following the standard protocol during bolus administration of intravenous contrast. RADIATION DOSE REDUCTION: This exam was performed according to the departmental dose-optimization program which includes automated exposure control, adjustment of the mA and/or kV according to patient size and/or use of iterative reconstruction technique. CONTRAST:  OMNIPAQUE IOHEXOL 300 MG/ML  SOLN COMPARISON:  CT [facility] 01/04/2021 and CT angio chest 03/25/2020 FINDINGS: CT CHEST FINDINGS Cardiovascular: Heart size is normal. Aortic atherosclerosis and coronary artery calcifications. No pericardial  effusion. Mediastinum/Nodes: Thyroid gland appears normal. Trachea appears patent and midline. Dilated patulous esophagus is identified to the level of the carina. No enlarged mediastinal or hilar lymph nodes. Lungs/Pleura: No pleural effusion, airspace consolidation, atelectasis or pneumothorax. Mild emphysema with diffuse bronchial wall thickening. Scarring noted within both lung bases. There are several, bilateral, upper lobe punctate lung nodules. Within the left apex nodule is unchanged measuring 3 mm, image 19/4. Punctate nodule in the periphery of the right upper lobe is unchanged measuring 2 mm, image 38/4. Punctate nodule in  the left upper lobe measuring 2 mm is also unchanged, image 38/4. These are compatible with benign nodules require no further follow-up. Musculoskeletal: No chest wall mass or suspicious bone lesions identified. CT ABDOMEN PELVIS FINDINGS Hepatobiliary: No suspicious liver lesions. Tiny stones within the dependent portion of the gallbladder. No wall thickening or inflammation. No bile duct dilatation. None Pancreas: Unremarkable. No pancreatic ductal dilatation or surrounding inflammatory changes. Spleen: Normal in size without focal abnormality. Adrenals/Urinary Tract: Mass in the right adrenal gland measures 5.1 by 4.2 by 4.7 cm. There is multiple scattered calcifications identified within this mass. Small focus of macroscopic fat is identified within the mass, image 64/2. Centrally the mass appears low attenuation with peripheral rim of increased soft tissue. On the examination from 03/28/2020 this measured 4.9 x 3.5 cm. Normal left adrenal gland. No kidney mass, nephrolithiasis or signs of obstructive uropathy. Urinary bladder is normal. Stomach/Bowel: Stomach is nondistended. No pathologic dilatation of the large or small bowel loops. Mild to moderate stool burden is noted within the colon and rectum. No bowel wall thickening or inflammation. Status post appendectomy. Vascular/Lymphatic: Extensive aortic atherosclerosis. No aneurysm. Patent abdominal vascularity. No abdominal adenopathy. Reproductive: Prostate is unremarkable. Other: No free fluid or fluid collections. No signs of pneumoperitoneum. Musculoskeletal: No acute or significant osseous findings. L5-S1 degenerative disc disease. IMPRESSION: 1. No acute findings within the chest, abdomen or pelvis. No mass or adenopathy noted within the chest, abdomen or pelvis. 2. Dilated patulous esophagus is identified to the level of the carina. 3. Right adrenal gland mass containing foci of macroscopic fat is again noted mildly increased in size from previous  exam. 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 4. Cholelithiasis. 5. Coronary artery calcifications. 6. Aortic Atherosclerosis (ICD10-I70.0) and Emphysema (ICD10-J43.9). Electronically Signed   By: Signa Kell M.D.   On: 05/19/2023 08:15   CT HEAD WO CONTRAST ( ) Result Date: 05/19/2023 CLINICAL DATA:  Head trauma, moderate to severe. Difficulty eating for several months because E insert up choking. EXAM: CT HEAD WITHOUT CONTRAST TECHNIQUE: Contiguous axial images were obtained from the base of the skull through the vertex without intravenous contrast. RADIATION DOSE REDUCTION: This exam was performed according to the departmental dose-optimization program which includes automated exposure control, adjustment of the mA and/or kV according to patient size and/or use of iterative reconstruction technique. COMPARISON:  11/28/2013 FINDINGS: Brain: No evidence of acute infarction, hemorrhage, hydrocephalus, extra-axial collection or mass lesion/mass effect. Vascular: No hyperdense vessel or unexpected calcification. Skull: Normal. Negative for fracture or focal lesion. Sinuses/Orbits: Paranasal sinuses and mastoid air cells are clear. Other: None. IMPRESSION: No acute intracranial abnormalities. Electronically Signed   By: Signa Kell M.D.   On: 05/19/2023 08:01   EKG Vent. rate 74 BPM PR interval 257 ms QRS duration 91 ms QT/QTcB 408/428 ms P-R-T axes 42 252 103 Sinus rhythm Multiple premature complexes, vent & supraven Prolonged PR interval Left anterior fascicular block Anterior infarct, old  Nonspecific repol abnormality, diffuse leads Baseline wander in lead(s) V3    Assessment & Plan:    Principal Problem:   Dysphagia Active Problems:   Esophageal stricture   Gastritis and gastroduodenitis  Dysphagia, Odynophagia: - History of esophageal ring in the past status post dilation in 2008. -Input greatly appreciated, status post endoscopy today, significant for  hypertonic lower esophagus sphincter, status post dilation of entire esophagus, biopsies were taken, following results -Continue with soft diet. -Plan for esophageogram in a.m. -Further management per GI   Syncope>> bradycardia with pauses - Was noted to be bradycardic,  EKG significant for left anterior fascicular block -Reports his syncope is related mainly to choking on the food and fainting after that likely vasovagal, but he does have significant pauses on telemetry currently while he is not even eating which are concerning, I will check TSH, obtain 2D echo, correct his hypokalemia and consult cardiology  Anemia: -Chronic, will check anemia panel  Hypokalemia: - Replaced    DVT Prophylaxis SCD  AM Labs Ordered, also please review Full Orders  Family Communication: Admission, patients condition and plan of care including tests being ordered have been discussed with the patient who indicate understanding and agree with the plan and Code Status.  Code Status full  Likely DC to  home  Consults called: gi    Admission status: observation    Time spent in minutes : 50 minutes   Huey Bienenstock M.D on 05/19/2023 at 7:35 PM   [facility] - Office  [phone]

## 2023-05-19 NOTE — Anesthesia Procedure Notes (Signed)
 Date/Time: 05/19/2023 12:59 PM  Performed by: Julian Reil, CRNAPre-anesthesia Checklist: Patient identified, Emergency Drugs available, Suction available and Patient being monitored Patient Re-evaluated:Patient Re-evaluated prior to induction Oxygen Delivery Method: Nasal cannula Induction Type: IV induction Placement Confirmation: positive ETCO2

## 2023-05-19 NOTE — Plan of Care (Signed)

## 2023-05-19 NOTE — ED Provider Notes (Signed)
 [facility] Emergency Department Provider Note MRN:  [id number]  Arrival date & time: 05/19/23     Chief Complaint   Dysphagia   History of Present Illness   Mike Moran is a 68 y.o. year-old male with no pertinent past medical history presenting to the ED with chief complaint of dysphagia.  Patient admits to shame currently because he admits that he should have come to see a doctor sooner.  He has been having persistent trouble swallowing and pain with swallowing for 6 months, getting progressively worse.  When he tries to swallow anything it seems to get stuck in his chest and this causes pain.  When this happens he is forced to struggle and cough and feel like he is choking and then he is able to regurgitate the food.  He is feeling very hungry and thirsty all the time but he is never able to get his fill because of the symptoms.  He is losing weight.  This morning he had an episode and passed out during episode and fell backwards and hit his head.  He woke up disoriented and decided this was now the time to be evaluated.  He did require an esophageal dilation procedure 10 years ago.  Review of Systems  A thorough review of systems was obtained and all systems are negative except as noted in the HPI and PMH.   Patient's Health History    Past Medical History:  Diagnosis Date   Anxiety    Depression     Past Surgical History:  Procedure Laterality Date   INGUINAL HERNIA REPAIR Right 11/05/2020   Procedure: HERNIA REPAIR INGUINAL ADULT WITH MESH;  Surgeon: Franky Macho, MD;  Location: [facility];  Service: General;  Laterality: Right;   LAPAROSCOPIC APPENDECTOMY N/A 01/04/2021   Procedure: APPENDECTOMY LAPAROSCOPIC;  Surgeon: Franky Macho, MD;  Location: [facility];  Service: General;  Laterality: N/A;    No family history on file.  Social History   Socioeconomic History   Marital status: Married    Spouse name: Not on file   Number of children: Not on  file   Years of education: Not on file   Highest education level: Not on file  Occupational History   Not on file  Tobacco Use   Smoking status: Former   Smokeless tobacco: Never  Vaping Use   Vaping status: Never Used  Substance and Sexual Activity   Alcohol use: Yes    Alcohol/week: 0.0 standard drinks of alcohol    Comment: occ- pt denies SA use   Drug use: No   Sexual activity: Not on file  Other Topics Concern   Not on file  Social History Narrative   Not on file   Social Drivers of Health   Financial Resource Strain: Not on file  Food Insecurity: Not on file  Transportation Needs: Not on file  Physical Activity: Not on file  Stress: Not on file  Social Connections: Not on file  Intimate Partner Violence: Not on file     Physical Exam   Vitals:   05/19/23 0557 05/19/23 0600  BP:  121/75  Pulse:  75  Resp: 18   Temp: 98.3 F (36.8 C)   SpO2:  98%    CONSTITUTIONAL: Chronically ill-appearing NEURO/PSYCH:  Alert and oriented x 3, no focal deficits EYES:  eyes equal and reactive ENT/NECK:  no LAD, no JVD CARDIO: Regular rate, well-perfused, normal S1 and S2 PULM:  CTAB no wheezing or rhonchi  GI/GU:  non-distended, non-tender MSK/SPINE:  No gross deformities, no edema SKIN:  no rash, atraumatic   *Additional and/or pertinent findings included in MDM below  Diagnostic and Interventional Summary    EKG Interpretation Date/Time:  Thursday May 19 2023 06:29:27 EDT Ventricular Rate:  74 PR Interval:  257 QRS Duration:  91 QT Interval:  408 QTC Calculation: 428 R Axis:   252  Text Interpretation: Sinus rhythm Multiple premature complexes, vent & supraven Prolonged PR interval Left anterior fascicular block Anterior infarct, old Nonspecific repol abnormality, diffuse leads Baseline wander in lead(s) V3 Confirmed by Kennis Carina [id number]) on 05/19/2023 6:40:04 AM       Labs Reviewed  CBC - Abnormal; Notable for the following components:      Result  Value   RBC 3.68 (*)    Hemoglobin 10.8 (*)    HCT 33.1 (*)    All other components within normal limits  COMPREHENSIVE METABOLIC PANEL WITH GFR  LIPASE, BLOOD  PROTIME-INR    CT CHEST ABDOMEN PELVIS W CONTRAST    (Results Pending)  CT HEAD WO CONTRAST ( )    (Results Pending)    Medications  sodium chloride 0.9 % bolus 1,000 mL (1,000 mLs Intravenous New Bag/Given 05/19/23 0628)  ondansetron (ZOFRAN) injection 4 mg (4 mg Intravenous Given 05/19/23 0628)  morphine (PF) 4 MG/ML injection 4 mg (4 mg Intravenous Given 05/19/23 6045)     Procedures  /  Critical Care Procedures  ED Course and Medical Decision Making  Initial Impression and Ddx Differential diagnosis includes esophageal stricture, esophageal mass, esophageal web, other considerations include dehydration, acute kidney injury, electrolyte disturbance.  Patient did hit his head and so intracranial bleeding is considered as well.  Passed out and so we will obtain screening EKG for arrhythmia.  Given patient's description and the progression of the symptoms, suspect patient will ultimately be admitted to the hospital for endoscopy.  Past medical/surgical history that increases complexity of ED encounter: Anxiety, depression, remote history of esophageal dilation procedure  Interpretation of Diagnostics I personally reviewed the EKG and my interpretation is as follows: Sinus rhythm with frequent PAC  Labs and CT pending  Patient Reassessment and Ultimate Disposition/Management     Awaiting CT imaging results, signed out to oncoming provider at shift change.  Patient management required discussion with the following services or consulting groups:  None  Complexity of Problems Addressed Acute illness or injury that poses threat of life of bodily function  Additional Data Reviewed and Analyzed Further history obtained from: Prior labs/imaging results  Additional Factors Impacting ED Encounter Risk Consideration of  hospitalization  Elmer Sow. Pilar Plate, MD [facility] Emergency Medicine [facility] [email or web address]  Final Clinical Impressions(s) / ED Diagnoses     ICD-10-CM   1. Odynophagia  R13.10       ED Discharge Orders     None        Discharge Instructions Discussed with and Provided to Patient:   Discharge Instructions   None      Sabas Sous, MD 05/19/23 [phone]

## 2023-05-19 NOTE — ED Notes (Signed)
 Patient transported to CT

## 2023-05-19 NOTE — Anesthesia Preprocedure Evaluation (Addendum)
 Anesthesia Evaluation  Patient identified by MRN, date of birth, ID band Patient awake    Reviewed: Allergy & Precautions, H&P , NPO status , Patient's Chart, lab work & pertinent test results, reviewed documented beta blocker date and time   Airway Mallampati: II  TM Distance: >3 FB Neck ROM: full    Dental  (+) Edentulous Upper, Edentulous Lower   Pulmonary pneumonia, Patient abstained from smoking., former smoker   Pulmonary exam normal breath sounds clear to auscultation       Cardiovascular Exercise Tolerance: Good hypertension, negative cardio ROS + dysrhythmias  Rhythm:regular Rate:Normal     Neuro/Psych  PSYCHIATRIC DISORDERS Anxiety Depression    negative neurological ROS  negative psych ROS   GI/Hepatic negative GI ROS, Neg liver ROS,,,  Endo/Other  negative endocrine ROS    Renal/GU negative Renal ROS  negative genitourinary   Musculoskeletal   Abdominal   Peds  Hematology negative hematology ROS (+) Blood dyscrasia, anemia   Anesthesia Other Findings   Reproductive/Obstetrics negative OB ROS                             Anesthesia Physical Anesthesia Plan  ASA: 3 and emergent  Anesthesia Plan: General   Post-op Pain Management:    Induction:   PONV Risk Score and Plan: Propofol infusion  Airway Management Planned:   Additional Equipment:   Intra-op Plan:   Post-operative Plan:   Informed Consent: I have reviewed the patients History and Physical, chart, labs and discussed the procedure including the risks, benefits and alternatives for the proposed anesthesia with the patient or authorized representative who has indicated his/her understanding and acceptance.     Dental Advisory Given  Plan Discussed with: CRNA  Anesthesia Plan Comments:        Anesthesia Quick Evaluation

## 2023-05-19 NOTE — ED Triage Notes (Signed)
 Pt states he has had difficulty eating for months because he ends up choking. Is worried because he feels like he is losing weight

## 2023-05-19 NOTE — ED Provider Notes (Signed)
 Signout from Dr. Pilar Plate.  68 year old male here with 6 months of worsening dysphagia and odynophagia.  Today had another choking episode which led to a syncopal event.  Losing weight.  Remote history of esophageal dilatation.  Eating labs and imaging.  Likely will need GI consult probable admission Physical Exam  BP 121/75   Pulse 75   Temp 98.3 F (36.8 C) (Oral)   Resp 18   SpO2 98%   Physical Exam  Procedures  Procedures  ED Course / MDM   Clinical Course as of 05/19/23 1514  Thu May 19, 2023  0855 Discussed with Dr. Levon Hedger GI. he will see later today for endoscopy.  Recommended continuing NPO. [MB]    Clinical Course User Index [MB] Terrilee Files, MD   Medical Decision Making Amount and/or Complexity of Data Reviewed Labs: ordered. Radiology: ordered. ECG/medicine tests: ordered.  Risk Prescription drug management. Decision regarding hospitalization.   CT head and chest abdomen and pelvis do not show any acute findings to explain patient's dysphagia.  They do comment upon a patulous esophagus.  Reviewed with patient.  He said he has had 50 or 60 pound weight loss over the last 6 months due to his inability to keep down food.  Will page GI for recommendations.  Dr. Levon Hedger is going to take the patient to endoscopy.  1:30 PM.  Dr. Levon Hedger message me and told me that the patient would need a medical admission due to his general weakness after procedure.  He also needs a barium swallow which he cannot receive today.  3 PM.  Have paged hospitalist for admission.  Discussed with Dr. Juanita Laster, Kayleen Memos, MD 05/19/23 [phone]

## 2023-05-19 NOTE — ED Notes (Signed)
 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 Message sent to charge nurse regarding pt's request.

## 2023-05-19 NOTE — ED Notes (Signed)
Pt being transported for EGD.

## 2023-05-19 NOTE — Op Note (Signed)
 [facility] Patient Name: Mike Moran Procedure Date: 05/19/2023 12:40 PM MRN: [id number] Date of Birth: 04-03-1955 Attending MD: Sanjuan Dame , MD, [id number] CSN: [id number] Age: 68 Admit Type: Outpatient Procedure:                Upper GI endoscopy Indications:              Dysphagia, Odynophagia Providers:                Sanjuan Dame, MD, Francoise Ceo RN, RN, Lennice Sites                            Technician, Technician, [place] Wilson Referring MD:              Medicines:                Monitored Anesthesia Care Complications:            No immediate complications. Estimated Blood Loss:     Estimated blood loss was minimal. Procedure:                Pre-Anesthesia Assessment:                           - Prior to the procedure, a History and Physical                            was performed, and patient medications and                            allergies were reviewed. The patient's tolerance of                            previous anesthesia was also reviewed. The risks                            and benefits of the procedure and the sedation                            options and risks were discussed with the patient.                            All questions were answered, and informed consent                            was obtained. Prior Anticoagulants: The patient has                            taken no anticoagulant or antiplatelet agents. ASA                            Grade Assessment: II - A patient with mild systemic                            disease. After reviewing the risks and benefits,  the patient was deemed in satisfactory condition to                            undergo the procedure.                           After obtaining informed consent, the endoscope was                            passed under direct vision. Throughout the                            procedure, the patient's blood pressure, pulse, and                             oxygen saturations were monitored continuously. The                            GIF-H190 ([id number]) scope was introduced through the                            mouth, and advanced to the second part of duodenum.                            The upper GI endoscopy was accomplished without                            difficulty. The patient tolerated the procedure                            well. Scope In: 1:08:29 PM Scope Out: 1:20:56 PM Total Procedure Duration: 0 hours 12 minutes 27 seconds  Findings:      The lumen of the esophagus was dilated.      A hypertonic lower esophageal sphincter was found. A TTS dilator was       passed through the scope. Dilation with a 15-16.5-18 mm balloon dilator       was performed to 18 mm. The dilation site was examined following       endoscope reinsertion and showed moderate mucosal disruption. Biopsies       were obtained from the proximal and distal esophagus with cold forceps       for histology of suspected eosinophilic esophagitis.      There is no endoscopic evidence of esophagitis or ulcerations in the       entire esophagus.      A medium amount of food (residue) was found in the gastric body.      A single 6 mm submucosal papule (nodule) with no bleeding and no       stigmata of recent bleeding was found in the gastric antrum. Biopsies       were taken with a cold forceps for histology.      The duodenal bulb and second portion of the duodenum were normal. Impression:               - Dilation in the entire esophagus.                           -  Hypertonic lower esophageal sphincter, with mild                            pluckering . Dilated to 18mm.                           - A medium amount of food (residue) in the stomach.                           - A single submucosal papule (nodule) found in the                            stomach. likely pancreatic rest Biopsied.                           - Normal duodenal bulb and second portion of the                             duodenum.                           - Biopsies were taken with a cold forceps for                            evaluation of eosinophilic esophagitis.                           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                           - Repeat upper endoscopy at the next available                            appointment because the preparation was poor.                           - Return to GI clinic at appointment to be                            scheduled.                           -Follow up biopsy results                           -Obtain Esophagram ,Achalasia protocol                           -  Advance diet as tolerated; Aspiration precautions                           - Cut food in small pieces and chew food thoroughly                            to avoid regurgitation episodes.                           -Repeat Upper endoscopy as outpatient has gastric                            body exam was suboptimal due to moderate amount of                            food present                           - Colonoscopy as outpatient                           -Adrenal lesion workup as per primary team Procedure Code(s):        --- Professional ---                           [phone], Esophagogastroduodenoscopy, flexible,                            transoral; with transendoscopic balloon dilation of                            esophagus (less than 30 mm diameter)                           43239, 59, Esophagogastroduodenoscopy, flexible,                            transoral; with biopsy, single or multiple Diagnosis Code(s):        --- Professional ---                           K22.89, Other specified disease of esophagus                            K31.89, Other diseases of stomach and duodenum                           R13.10, Dysphagia, unspecified CPT copyright 2022 American [employer]. All rights reserved. The codes documented in this report are preliminary and upon coder review may  be revised to meet current compliance requirements. Sanjuan Dame, MD Sanjuan Dame, MD 05/19/2023 1:38:11 PM This report has been signed electronically. Number of Addenda: 0

## 2023-05-19 NOTE — Interval H&P Note (Signed)
 History and Physical Interval Note:  05/19/2023 12:44 PM  Mike Moran  has presented today for surgery, with the diagnosis of dysphagia, odynophagia.  The various methods of treatment have been discussed with the patient and family. After consideration of risks, benefits and other options for treatment, the patient has consented to  Procedure(s): EGD (ESOPHAGOGASTRODUODENOSCOPY) (N/A) as a surgical intervention.  The patient's history has been reviewed, patient examined, no change in status, stable for surgery.  I have reviewed the patient's chart and labs.  Questions were answered to the patient's satisfaction.    We will proceed with EGD as scheduled.    I thoroughly discussed with the patient the procedure, including the risks involved. Patient understands what the procedure involves including the benefits and any risks. Patient understands alternatives to the proposed procedure. Risks including (but not limited to) bleeding, tearing of the lining (perforation), rupture of adjacent organs, problems with heart and lung function, infection, and medication reactions. A small percentage of complications may require surgery, hospitalization, repeat endoscopic procedure, and/or transfusion.  Patient understood and agreed.    Juanetta Beets Adaleen Hulgan

## 2023-05-19 NOTE — Care Management Obs Status (Signed)
 MEDICARE OBSERVATION STATUS NOTIFICATION   Patient Details  Name: Mike Moran MRN: [id number] Date of Birth: 1956/01/09   Medicare Observation Status Notification Given:  Yes    Barron Alvine, RN 05/19/2023, 10:14 PM

## 2023-05-19 NOTE — Brief Op Note (Signed)
 Patient underwent EGD with dilation under propofol sedation.  Tolerated the procedure adequately.   FINDINGS:  - Dilation in the entire esophagus.   - Hypertonic lower esophageal sphincter, with mild pluckering . Dilated to 18mm.   - A medium amount of food ( residue) in the stomach.   - A single submucosal papule ( nodule) found in the stomach. likely pancreatic rest,  Biopsied.   - Normal duodenal bulb and second portion of the duodenum.   - Biopsies were taken with a cold forceps for evaluation of eosinophilic esophagitis.   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   - Repeat upper endoscopy at the next available appointment because the preparation was poor.   - Return to GI clinic at appointment to be scheduled.   - Follow up biopsy results   - Obtain Esophagram , Achalasia protocol   - Advance diet as tolerated; Aspiration precautions   - Cut food in small pieces and chew food thoroughly to avoid regurgitation episodes.   - Repeat Upper endoscopy as outpatient has gastric body exam was suboptimal due to moderate amount of food present   - Colonoscopy as outpatient   - Adrenal lesion workup as per primary team  Please recall GI with any further questions  Vista Lawman, MD Gastroenterology and Hepatology [facility]

## 2023-05-19 NOTE — Consult Note (Signed)
 Gastroenterology Consult   Referring Provider: No ref. provider found Primary Care Physician:  Pcp, No Primary Gastroenterologist: Vista Lawman, MD (previously Dr. Karilyn Cota)   Patient ID: Mike Moran; [id number]; [birth year]   Admit date: 05/19/2023  LOS: 0 days   Date of Consultation: 05/19/2023  Reason for Consultation:  dysphagia, odynophagia  History of Present Illness   Mike Moran is a 68 y.o. year old male with history of anxiety/depression with prior SI in 2017, appendectomy in 2022, perirectal abscess in 2008 who presented with 6 months of dysphagia and odynophagia after syncopal episode at home. GI consulted for further evaluation.  ED COURSE: Per reports to ED physician he has had pain with swallowing anything and feels like it gets stuck in his chest and then he coughs and tries to force it down or up and feels like he is choking. Constant polyphagia and polydipsia. Reported weight loss as well. Reported syncopal episode this mornign and fell backwards and hit his head with + LOC. He reported needing esophageal dilation about 10 years ago.   CT A/P: -Dilated esophagus to level of carina. 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  Melena or brbpr: Denies  Has good appetite but having frequent regurge with solids and liquids. Denies taking any regular medications. No PPI or H2 blocker. Denies reflux symptoms. Reports about a 40lb weight loss since this has been going on (at least 6 months - possibly longer). Used to weight 170-175, now currently 130s. Occasionally able to keep down soft candy bar but most solid and even soft food is hard to tolerate. Has chest pain only when food gets stuck and shortness of breath and anxiety comes with this. Reiterates the story he  told ED physician in regards to syncopal episode.   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   EGD Feb 2008: -esophageal ring s/p dilation -normal stomach and duodenum.  Colonoscopy 2008: - two polyps (cecum and transverse) -small internal/external hemorrhoids  History of perirectal abscess: 10 oclock position around anus. Drained in July and November 2008  Past Medical History:  Diagnosis Date   Anxiety    Depression     Past Surgical History:  Procedure Laterality Date   INGUINAL HERNIA REPAIR Right 11/05/2020   Procedure: HERNIA REPAIR INGUINAL ADULT WITH MESH;  Surgeon: Franky Macho, MD;  Location: [facility];  Service: General;  Laterality: Right;   LAPAROSCOPIC APPENDECTOMY N/A 01/04/2021   Procedure: APPENDECTOMY LAPAROSCOPIC;  Surgeon: Franky Macho, MD;  Location: [facility];  Service: General;  Laterality: N/A;    Prior to Admission medications   Medication Sig Start Date End Date Taking? Authorizing Provider  amoxicillin-clavulanate (AUGMENTIN) 875-125 MG tablet Take 1 tablet by mouth 2 (two) times daily. 01/07/21   Franky Macho, MD  aspirin EC 81 MG tablet Take 1 tablet (81 mg total) by mouth daily. For heart health 08/04/15   Armandina Stammer I, NP  DULoxetine (CYMBALTA) 60 MG capsule TAKE (1) CAPSULE BY MOUTH ONCE DAILY. 04/05/22   Myrlene Broker, MD  hydrOXYzine (ATARAX) 25 MG tablet TAKE ONE TABLET BY MOUTH EVERY 6 HOURS AS NEEDED FOR ANXIETY. 08/18/22   Myrlene Broker, MD  Multiple Vitamins-Minerals (MULTIVITAMIN WITH MINERALS) tablet Take 1 tablet by mouth daily.    [provider]  ondansetron (  ZOFRAN) 4 MG tablet Take 1 tablet (4 mg total) by mouth every 8 (eight) hours as needed for nausea or vomiting. 01/07/21   Franky Macho, MD  oxyCODONE-acetaminophen (PERCOCET) 7.5-325 MG tablet Take 1 tablet by mouth every 4 (four) hours as needed for severe pain. 01/13/21   Franky Macho, MD  pantoprazole (PROTONIX) 40 MG tablet Take 1 tablet (40 mg total) by mouth daily.  01/07/21 01/07/22  Franky Macho, MD  risperiDONE (RISPERDAL) 0.5 MG tablet TAKE 1 TABLET BY MOUTH AT BEDTIME 04/05/22   Myrlene Broker, MD  traZODone (DESYREL) 50 MG tablet TAKE (1) TABLET BY MOUTH AT BEDTIME AS NEEDED. MAY REPEAT DOSE (1) TIME IF NEEDED FOR SLEEP. 06/26/22   Myrlene Broker, MD    No current facility-administered medications for this encounter.   Current Outpatient Medications  Medication Sig Dispense Refill   amoxicillin-clavulanate (AUGMENTIN) 875-125 MG tablet Take 1 tablet by mouth 2 (two) times daily. 14 tablet 0   aspirin EC 81 MG tablet Take 1 tablet (81 mg total) by mouth daily. For heart health     DULoxetine (CYMBALTA) 60 MG capsule TAKE (1) CAPSULE BY MOUTH ONCE DAILY. 90 capsule 0   hydrOXYzine (ATARAX) 25 MG tablet TAKE ONE TABLET BY MOUTH EVERY 6 HOURS AS NEEDED FOR ANXIETY. 90 tablet 0   Multiple Vitamins-Minerals (MULTIVITAMIN WITH MINERALS) tablet Take 1 tablet by mouth daily.     ondansetron (ZOFRAN) 4 MG tablet Take 1 tablet (4 mg total) by mouth every 8 (eight) hours as needed for nausea or vomiting. 20 tablet 1   oxyCODONE-acetaminophen (PERCOCET) 7.5-325 MG tablet Take 1 tablet by mouth every 4 (four) hours as needed for severe pain. 40 tablet 0   pantoprazole (PROTONIX) 40 MG tablet Take 1 tablet (40 mg total) by mouth daily. 30 tablet 1   risperiDONE (RISPERDAL) 0.5 MG tablet TAKE 1 TABLET BY MOUTH AT BEDTIME 90 tablet 0   traZODone (DESYREL) 50 MG tablet TAKE (1) TABLET BY MOUTH AT BEDTIME AS NEEDED. MAY REPEAT DOSE (1) TIME IF NEEDED FOR SLEEP. 60 tablet 0    Allergies as of 05/19/2023 - Review Complete 05/19/2023  Allergen Reaction Noted   Codeine Nausea And Vomiting 11/28/2013   Lexapro [escitalopram oxalate] Other (See Comments) 01/09/2014    History reviewed. No pertinent family history.  Social History   Socioeconomic History   Marital status: Married    Spouse name: Not on file   Number of children: Not on file   Years of education:  Not on file   Highest education level: Not on file  Occupational History   Not on file  Tobacco Use   Smoking status: Former   Smokeless tobacco: Never  Vaping Use   Vaping status: Never Used  Substance and Sexual Activity   Alcohol use: Yes    Alcohol/week: 0.0 standard drinks of alcohol    Comment: occ- pt denies SA use   Drug use: No   Sexual activity: Not on file  Other Topics Concern   Not on file  Social History Narrative   Not on file   Social Drivers of Health   Financial Resource Strain: Not on file  Food Insecurity: Not on file  Transportation Needs: Not on file  Physical Activity: Not on file  Stress: Not on file  Social Connections: Not on file  Intimate Partner Violence: Not on file   Review of Systems   Gen: + weight loss. Denies any fever, chills, loss of appetite CV:  Denies chest pain, heart palpitations, syncope, edema  Resp: Denies shortness of breath with rest, cough, wheezing, coughing up blood, and pleurisy. GI: + dysphagia or odynophagia. GU : Denies urinary burning, blood in urine, urinary frequency, and urinary incontinence. MS: Denies joint pain, limitation of movement, swelling, cramps, and atrophy.  Derm: Denies rash, itching, dry skin, hives. Psych: + anxiety, depression. Denies memory loss, hallucinations, and confusion. Heme: Denies bruising or bleeding. Neuro:  Denies any headaches, dizziness, paresthesias, shaking  Physical Exam   Vital Signs in last 24 hours: Temp:  [98 F (36.7 C)-98.3 F (36.8 C)] 98 F (36.7 C) (04/10 0952) Pulse Rate:  [47-75] 52 (04/10 1000) Resp:  [9-21] 15 (04/10 1000) BP: (100-130)/(64-102) 102/64 (04/10 1000) SpO2:  [80 %-98 %] 95 % (04/10 1000)   General: Drowsy,  Well-developed, pleasant and cooperative in NAD Head:  Normocephalic and atraumatic. Eyes:  Sclera clear, no icterus.   Conjunctiva pink. Ears:  Normal auditory acuity. Mouth:  No deformity or lesions, dentition normal. Neck:  Supple; no  masses Lungs:  Clear throughout to auscultation.   No wheezes, crackles, or rhonchi. No acute distress. Heart:  Regular rate and rhythm; no murmurs, clicks, rubs,  or gallops. Abdomen:  Soft, nontender and nondistended. No masses, hepatosplenomegaly or hernias noted. Normal bowel sounds, without guarding, and without rebound.   Rectal: deferred   Msk:  Symmetrical without gross deformities. Normal posture. Neurologic:  Alert and  oriented x4. Skin:  Intact without significant lesions or rashes. Psych:  Drowsy, anxious. Normal mood and affect.  Intake/Output from previous day: No intake/output data recorded. Intake/Output this shift: Total I/O In: 1000 [IV Piggyback:1000] Out: 350 [Urine:350]  Wt Readings from Last 3 Encounters:  01/13/21 58.1 kg  01/04/21 65.8 kg  11/13/20 56.2 kg   Labs/Studies   Recent Labs Recent Labs    05/19/23 0610  WBC 9.3  HGB 10.8*  HCT 33.1*  PLT 306   BMET Recent Labs    05/19/23 0610  NA 136  K 3.3*  CL 105  CO2 24  GLUCOSE 127*  BUN 16  CREATININE 0.60*  CALCIUM 9.0   LFT Recent Labs    05/19/23 0610  PROT 6.7  ALBUMIN 3.8  AST 21  ALT 14  ALKPHOS 49  BILITOT 0.5   PT/INR Recent Labs    05/19/23 0610  LABPROT 14.8  INR 1.1   Hepatitis Panel No results for input(s): "HEPBSAG", "HCVAB", "HEPAIGM", "HEPBIGM" in the last 72 hours. C-Diff No results for input(s): "CDIFFTOX" in the last 72 hours.  Radiology/Studies CT CHEST ABDOMEN PELVIS W CONTRAST Result Date: 05/19/2023 CLINICAL DATA:  Evaluate for metastatic disease. Loss of weight. Complains of difficulty swallowing. EXAM: CT CHEST, ABDOMEN, AND PELVIS WITH CONTRAST TECHNIQUE: Multidetector CT imaging of the chest, abdomen and pelvis was performed following the standard protocol during bolus administration of intravenous contrast. RADIATION DOSE REDUCTION: This exam was performed according to the departmental dose-optimization program which includes automated exposure  control, adjustment of the mA and/or kV according to patient size and/or use of iterative reconstruction technique. CONTRAST:  OMNIPAQUE IOHEXOL 300 MG/ML  SOLN COMPARISON:  CT [facility] 01/04/2021 and CT angio chest 03/25/2020 FINDINGS: CT CHEST FINDINGS Cardiovascular: Heart size is normal. Aortic atherosclerosis and coronary artery calcifications. No pericardial effusion. Mediastinum/Nodes: Thyroid gland appears normal. Trachea appears patent and midline. Dilated patulous esophagus is identified to the level of the carina. No enlarged mediastinal or hilar lymph nodes. Lungs/Pleura: No pleural effusion, airspace consolidation, atelectasis  or pneumothorax. Mild emphysema with diffuse bronchial wall thickening. Scarring noted within both lung bases. There are several, bilateral, upper lobe punctate lung nodules. Within the left apex nodule is unchanged measuring 3 mm, image 19/4. Punctate nodule in the periphery of the right upper lobe is unchanged measuring 2 mm, image 38/4. Punctate nodule in the left upper lobe measuring 2 mm is also unchanged, image 38/4. These are compatible with benign nodules require no further follow-up. Musculoskeletal: No chest wall mass or suspicious bone lesions identified. CT ABDOMEN PELVIS FINDINGS Hepatobiliary: No suspicious liver lesions. Tiny stones within the dependent portion of the gallbladder. No wall thickening or inflammation. No bile duct dilatation. None Pancreas: Unremarkable. No pancreatic ductal dilatation or surrounding inflammatory changes. Spleen: Normal in size without focal abnormality. Adrenals/Urinary Tract: Mass in the right adrenal gland measures 5.1 by 4.2 by 4.7 cm. There is multiple scattered calcifications identified within this mass. Small focus of macroscopic fat is identified within the mass, image 64/2. Centrally the mass appears low attenuation with peripheral rim of increased soft tissue. On the examination from 03/28/2020 this measured 4.9 x 3.5 cm.  Normal left adrenal gland. No kidney mass, nephrolithiasis or signs of obstructive uropathy. Urinary bladder is normal. Stomach/Bowel: Stomach is nondistended. No pathologic dilatation of the large or small bowel loops. Mild to moderate stool burden is noted within the colon and rectum. No bowel wall thickening or inflammation. Status post appendectomy. Vascular/Lymphatic: Extensive aortic atherosclerosis. No aneurysm. Patent abdominal vascularity. No abdominal adenopathy. Reproductive: Prostate is unremarkable. Other: No free fluid or fluid collections. No signs of pneumoperitoneum. Musculoskeletal: No acute or significant osseous findings. L5-S1 degenerative disc disease. IMPRESSION: 1. No acute findings within the chest, abdomen or pelvis. No mass or adenopathy noted within the chest, abdomen or pelvis. 2. Dilated patulous esophagus is identified to the level of the carina. 3. Right adrenal gland mass containing foci of macroscopic fat is again noted mildly increased in size from previous exam. 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 4. Cholelithiasis. 5. Coronary artery calcifications. 6. Aortic Atherosclerosis (ICD10-I70.0) and Emphysema (ICD10-J43.9). Electronically Signed   By: Signa Kell M.D.   On: 05/19/2023 08:15   CT HEAD WO CONTRAST ( ) Result Date: 05/19/2023 CLINICAL DATA:  Head trauma, moderate to severe. Difficulty eating for several months because E insert up choking. EXAM: CT HEAD WITHOUT CONTRAST TECHNIQUE: Contiguous axial images were obtained from the base of the skull through the vertex without intravenous contrast. RADIATION DOSE REDUCTION: This exam was performed according to the departmental dose-optimization program which includes automated exposure control, adjustment of the mA and/or kV according to patient size and/or use of iterative reconstruction technique. COMPARISON:  11/28/2013 FINDINGS: Brain: No evidence of acute infarction, hemorrhage, hydrocephalus,  extra-axial collection or mass lesion/mass effect. Vascular: No hyperdense vessel or unexpected calcification. Skull: Normal. Negative for fracture or focal lesion. Sinuses/Orbits: Paranasal sinuses and mastoid air cells are clear. Other: None. IMPRESSION: No acute intracranial abnormalities. Electronically Signed   By: Signa Kell M.D.   On: 05/19/2023 08:01   Assessment   SHAYON TROMPETER is a 68 y.o. year old male with history of anxiety/depression with prior SI in 2017, appendectomy in 2022, perirectal abscess in 2008 who presented with 6 months of dysphagia and odynophagia after syncopal episode at home. GI consulted for further evaluation.  Dysphagia, Odynophagia: - EGD in 2008 with dilation for esophageal ring - symptoms occurring for > 6 months -symptoms resulting in weight loss given frequent regurgitation (~  40lbs) -occurring with solids and liquids -no nsaids, not on ppi -denies reflux symptoms -occasional mucous production in mornings.   Anemia: - per review of labs this appears to be chronic - hgb 10.8 (9-12 in 2022) - Denies any sign of GI bleed - weight loss likely secondary to poor po intake from dysphagia.   Hypokalemia: -K 3.3, likely secondary to poor p.o intake -Will replace with IV given intolerant to p.o currently.   Bradycardia: -could be secondary to poor p.o. intake plus active lifestyle -12 lead with 1st degree AV block -management per hospitalist  Plan / Recommendations   NPO IV PPI daily EGD with dilation today with Dr. Tasia Catchings Trial of diet post procedure pending findings Due for outpatient surveillance colonoscopy   05/19/2023, 10:49 AM  Brooke Bonito, MSN, FNP-BC, AGACNP-BC [facility]

## 2023-05-19 NOTE — Progress Notes (Signed)
 Spoke with wife and notified her of admit room 322 and gave directions to unit

## 2023-05-19 NOTE — Progress Notes (Signed)
   05/19/23 2249  TOC Brief Assessment  Insurance and Status Reviewed  Patient has primary care physician Yes  Home environment has been reviewed From home c/wife  Prior level of function: Independent  Prior/Current Home Services No current home services  Social Drivers of Health Review SDOH reviewed no interventions necessary  Readmission risk has been reviewed Yes  Transition of care needs no transition of care needs at this time   Transition of Care Department [facility]) has reviewed patient and no other TOC needs have been identified at this time. We will continue to monitor patient advancement through interdisciplinary progression rounds. If new patient needs arise, please place a TOC consult.

## 2023-05-20 ENCOUNTER — Observation Stay (HOSPITAL_COMMUNITY)

## 2023-05-20 ENCOUNTER — Telehealth: Payer: Self-pay | Admitting: Gastroenterology

## 2023-05-20 DIAGNOSIS — Z681 Body mass index (BMI) 19 or less, adult: Secondary | ICD-10-CM | POA: Diagnosis not present

## 2023-05-20 DIAGNOSIS — F411 Generalized anxiety disorder: Secondary | ICD-10-CM | POA: Diagnosis present

## 2023-05-20 DIAGNOSIS — D509 Iron deficiency anemia, unspecified: Secondary | ICD-10-CM | POA: Diagnosis present

## 2023-05-20 DIAGNOSIS — R1319 Other dysphagia: Secondary | ICD-10-CM

## 2023-05-20 DIAGNOSIS — I440 Atrioventricular block, first degree: Secondary | ICD-10-CM | POA: Diagnosis present

## 2023-05-20 DIAGNOSIS — E876 Hypokalemia: Secondary | ICD-10-CM

## 2023-05-20 DIAGNOSIS — R001 Bradycardia, unspecified: Secondary | ICD-10-CM | POA: Diagnosis present

## 2023-05-20 DIAGNOSIS — I444 Left anterior fascicular block: Secondary | ICD-10-CM | POA: Diagnosis present

## 2023-05-20 DIAGNOSIS — K2970 Gastritis, unspecified, without bleeding: Secondary | ICD-10-CM | POA: Diagnosis present

## 2023-05-20 DIAGNOSIS — K3189 Other diseases of stomach and duodenum: Secondary | ICD-10-CM | POA: Diagnosis present

## 2023-05-20 DIAGNOSIS — K8020 Calculus of gallbladder without cholecystitis without obstruction: Secondary | ICD-10-CM | POA: Diagnosis present

## 2023-05-20 DIAGNOSIS — K220 Achalasia of cardia: Secondary | ICD-10-CM

## 2023-05-20 DIAGNOSIS — R627 Adult failure to thrive: Secondary | ICD-10-CM | POA: Diagnosis present

## 2023-05-20 DIAGNOSIS — K2990 Gastroduodenitis, unspecified, without bleeding: Secondary | ICD-10-CM

## 2023-05-20 DIAGNOSIS — Y92099 Unspecified place in other non-institutional residence as the place of occurrence of the external cause: Secondary | ICD-10-CM | POA: Diagnosis not present

## 2023-05-20 DIAGNOSIS — W19XXXA Unspecified fall, initial encounter: Secondary | ICD-10-CM | POA: Diagnosis present

## 2023-05-20 DIAGNOSIS — R1310 Dysphagia, unspecified: Secondary | ICD-10-CM

## 2023-05-20 DIAGNOSIS — S069X9A Unspecified intracranial injury with loss of consciousness of unspecified duration, initial encounter: Secondary | ICD-10-CM | POA: Diagnosis present

## 2023-05-20 DIAGNOSIS — R632 Polyphagia: Secondary | ICD-10-CM | POA: Diagnosis present

## 2023-05-20 DIAGNOSIS — R634 Abnormal weight loss: Secondary | ICD-10-CM | POA: Diagnosis present

## 2023-05-20 DIAGNOSIS — Z7982 Long term (current) use of aspirin: Secondary | ICD-10-CM | POA: Diagnosis not present

## 2023-05-20 DIAGNOSIS — D649 Anemia, unspecified: Secondary | ICD-10-CM

## 2023-05-20 DIAGNOSIS — F32A Depression, unspecified: Secondary | ICD-10-CM | POA: Diagnosis present

## 2023-05-20 DIAGNOSIS — R55 Syncope and collapse: Secondary | ICD-10-CM

## 2023-05-20 DIAGNOSIS — I493 Ventricular premature depolarization: Secondary | ICD-10-CM | POA: Diagnosis present

## 2023-05-20 DIAGNOSIS — K2289 Other specified disease of esophagus: Secondary | ICD-10-CM | POA: Diagnosis present

## 2023-05-20 DIAGNOSIS — K222 Esophageal obstruction: Secondary | ICD-10-CM | POA: Diagnosis present

## 2023-05-20 DIAGNOSIS — R631 Polydipsia: Secondary | ICD-10-CM | POA: Diagnosis present

## 2023-05-20 DIAGNOSIS — D1779 Benign lipomatous neoplasm of other sites: Secondary | ICD-10-CM | POA: Diagnosis present

## 2023-05-20 DIAGNOSIS — I10 Essential (primary) hypertension: Secondary | ICD-10-CM | POA: Diagnosis present

## 2023-05-20 HISTORY — DX: Anemia, unspecified: D64.9

## 2023-05-20 HISTORY — DX: Hypokalemia: E87.6

## 2023-05-20 LAB — HIV ANTIBODY (ROUTINE TESTING W REFLEX): HIV Screen 4th Generation wRfx: NONREACTIVE

## 2023-05-20 LAB — CBC
HCT: 33.2 % — ABNORMAL LOW (ref 39.0–52.0)
Hemoglobin: 11 g/dL — ABNORMAL LOW (ref 13.0–17.0)
MCH: 30 pg (ref 26.0–34.0)
MCHC: 33.1 g/dL (ref 30.0–36.0)
MCV: 90.5 fL (ref 80.0–100.0)
Platelets: 330 10*3/uL (ref 150–400)
RBC: 3.67 MIL/uL — ABNORMAL LOW (ref 4.22–5.81)
RDW: 15.1 % (ref 11.5–15.5)
WBC: 6.6 10*3/uL (ref 4.0–10.5)
nRBC: 0 % (ref 0.0–0.2)

## 2023-05-20 LAB — ECHOCARDIOGRAM COMPLETE
Height: 72 in
S' Lateral: 2.3 cm
Weight: 1840 [oz_av]

## 2023-05-20 LAB — BASIC METABOLIC PANEL WITH GFR
Anion gap: 4 — ABNORMAL LOW (ref 5–15)
BUN: 13 mg/dL (ref 8–23)
CO2: 24 mmol/L (ref 22–32)
Calcium: 8.8 mg/dL — ABNORMAL LOW (ref 8.9–10.3)
Chloride: 108 mmol/L (ref 98–111)
Creatinine, Ser: 0.7 mg/dL (ref 0.61–1.24)
GFR, Estimated: >60 mL/min (ref >60)
Glucose, Bld: 102 mg/dL — ABNORMAL HIGH (ref 70–99)
Potassium: 3.9 mmol/L (ref 3.5–5.1)
Sodium: 136 mmol/L (ref 135–145)

## 2023-05-20 MED ORDER — DOCUSATE SODIUM 100 MG PO CAPS
100.0000 mg | ORAL_CAPSULE | Freq: Two times a day (BID) | ORAL | Status: AC
  Administered 2023-05-20 – 2023-05-21 (×3): 100 mg via ORAL
  Filled 2023-05-20 (×3): qty 1

## 2023-05-20 MED ORDER — MORPHINE SULFATE (PF) 2 MG/ML IV SOLN
1.0000 mg | INTRAVENOUS | Status: DC | PRN
  Administered 2023-05-20 – 2023-05-22 (×11): 1 mg via INTRAVENOUS
  Filled 2023-05-20 (×13): qty 1

## 2023-05-20 NOTE — Assessment & Plan Note (Addendum)
 EGD with esophageal dilatation, hypertonic lower esophageal sphincter and was dilatated.  Esophagogram with positive dysmotility with limited primary wave and tertiary peristaltic activity and stasis of ingested bolus contrast.   Plan to continue with soft diet today.  Advice to have small portions and to eat slowly.  Follow up with tertiary care for esophageal manometry   Pain control with as needed morphine.  Esophageal pain triggering vagal response and provoking syncope.  No further syncope episode.

## 2023-05-20 NOTE — Progress Notes (Signed)
 Patient not seen.  Reviewed barium pill esophagram report. Single contrast exam performed.  Esophageal dysmotility with limited primary wave and tertiary peristaltic activity and stasis of ingested bolus of contrast within the esophagus.   BPE is concerning for achalasia.  He will need urgent referral for outpatient manometry.  We will send this to [facility].  If he is able to tolerate diet today, preferably full liquid diet that he may be discharged home.  If he is not able to tolerate diet, we may need to consider other methods of nutrition in the interim until diagnosis can be confirmed and treatment plan established. If achalasia confirmed he will need urgent referral to [facility].  Brooke Bonito, MSN, APRN, FNP-BC, AGACNP-BC [facility] at [facility]

## 2023-05-20 NOTE — Assessment & Plan Note (Addendum)
 Continue with po pantoprazole.

## 2023-05-20 NOTE — Assessment & Plan Note (Addendum)
 Cell count stable with Hgb at 11.0 Mild iron deficiency anemia with serum iron 52, TIBC 318 and ferritin 34 with transferrin saturation at 16 Considering current dyspepsia and dysphagia will hold on iron supplementation until follow up as outpatient.

## 2023-05-20 NOTE — Telephone Encounter (Signed)
 Please send STAT referral for manometry to [facility] for dysphagia, concern for achalasia.   Darl Pikes - please make hospital follow up in our office for patient within the next 2 weeks with Dr. Tasia Catchings or myself.   Brooke Bonito, MSN, APRN, FNP-BC, AGACNP-BC [facility] at [facility]

## 2023-05-20 NOTE — Assessment & Plan Note (Addendum)
 Electrolytes were corrected.  Her discharge serum cr is  136, K 3,9 and CL 108 and serum bicarbonate at 24  Na 136

## 2023-05-20 NOTE — Progress Notes (Signed)
  Progress Note   Patient: Mike Moran:[id number] DOB: 1955/04/01 DOA: 05/19/2023     0 DOS: the patient was seen and examined on 05/20/2023   Brief hospital course: [name] was admitted to the hospital with the working diagnosis of esophageal stricture.   68 yo male with the past medical history of anxiety, depression, who presented with dysphagia and odynophagia. Apparently he had syncope episode when eating at the time when he was having difficulty swallowing.  Patient was evaluated by GI in the ED and underwent esophageal dilatation.  On his initial physical examination his blood pressure was 131/80, HR 52, RR 16 and 02 saturation 99%.  Lungs with no wheezing or rales, heart with S1 and S2 present and regular with no gallops, rubs or murmurs, abdomen with no distention, no lower extremity edema.   Assessment and Plan: * Dysphagia EGD with esophageal dilatation, hypertonic lower esophageal sphincter and was dilatated.  Esophagogram with positive dysmotility with limited primary wave and tertiary peristaltic activity and stasis of ingested bolus contrast.   Plan to continue with soft diet today.  Follow up with tertiary care for esophageal manometry   Pain control with as needed morphine.  Esophageal pain triggering vagal response and provoking syncope.   Gastritis and gastroduodenitis Continue pantoprazole, change to po.   Hypokalemia Renal function stable with serum cr at 070 with K at 3,9 and serum bicarbonate at 24  Na 136   Plan to continue close follow up as outpatient.   Chronic anemia Cell count stable with Hgb at 11.0 Follow up as outpatient/   GAD (generalized anxiety disorder) Add trazodone for sleep at night         Subjective: Patient continue to have dysphagia and odynophagia, no chest pain, no dyspnea,  Physical Exam: Vitals:   05/20/23 0006 05/20/23 0418 05/20/23 1255 05/20/23 1317  BP: (!) 105/59 95/66 106/80 94/61  Pulse: 60 (!) 52 73 70   Resp: 16 15    Temp: 98 F (36.7 C) 98.7 F (37.1 C) 98.7 F (37.1 C)   TempSrc:  Oral Oral   SpO2: 96% 99% 97%   Weight:      Height:       Neurology awake and alert ENT with mild pallor with no icterus Cardiovascular with S1 and S2 present and regular with no gallops, rubs or murmurs Respiratory with no rales or wheezing, no rhonchi  Abdomen with no distention  Data Reviewed:    Family Communication: I spoke with patient's wife at the bedside, we talked in detail about patient's condition, plan of care and prognosis and all questions were addressed.   Disposition: Status is: Observation The patient will require care spanning > 2 midnights and should be moved to inpatient because: pain control, patient not able to swallow   Planned Discharge Destination: Home      Author: Coralie Keens, MD 05/20/2023 4:00 PM  For on call review [email or web address].

## 2023-05-20 NOTE — Discharge Instructions (Signed)

## 2023-05-20 NOTE — Hospital Course (Addendum)
 Mike Moran was admitted to the hospital with the working diagnosis of esophageal stricture.   68 yo male with the past medical history of anxiety, depression, who presented with dysphagia and odynophagia. Apparently he had syncope episode when eating at the time when he was having difficulty swallowing.  Patient was evaluated by GI in the ED and underwent esophageal dilatation.  On his initial physical examination his blood pressure was 131/80, HR 52, RR 16 and 02 saturation 99%.  Lungs with no wheezing or rales, heart with S1 and S2 present and regular with no gallops, rubs or murmurs, abdomen with no distention, no lower extremity edema.   04/12 dysphagia symptoms are improving but continue with no consistent po intake.

## 2023-05-20 NOTE — Assessment & Plan Note (Addendum)
 Continue with trazodone for sleep.

## 2023-05-20 NOTE — TOC CM/SW Note (Signed)
 Chart review shows pt does not have a PCP. PCP list added to AVS.

## 2023-05-20 NOTE — Progress Notes (Signed)
 Mobility Specialist Progress Note:    05/20/23 1230  Mobility  Activity Ambulated with assistance in hallway  Level of Assistance Independent  Assistive Device None  Distance Ambulated (ft) 300 ft  Range of Motion/Exercises Active;All extremities  Activity Response Tolerated well  Mobility Referral Yes  Mobility visit 1 Mobility  Mobility Specialist Start Time (ACUTE ONLY) 1230  Mobility Specialist Stop Time (ACUTE ONLY) 1250  Mobility Specialist Time Calculation (min) (ACUTE ONLY) 20 min   Pt received in bed, visitor in room. Agreeable to mobility, independently able to stand and ambulate with no AD. Tolerated well,asx throughout. Returned pt supine, all needs met.  Reyann Troop Mobility Specialist Please contact via Special educational needs teacher or  Rehab office at [phone]

## 2023-05-21 DIAGNOSIS — R1319 Other dysphagia: Secondary | ICD-10-CM | POA: Diagnosis not present

## 2023-05-21 DIAGNOSIS — K2970 Gastritis, unspecified, without bleeding: Secondary | ICD-10-CM | POA: Diagnosis not present

## 2023-05-21 DIAGNOSIS — K222 Esophageal obstruction: Secondary | ICD-10-CM

## 2023-05-21 DIAGNOSIS — D649 Anemia, unspecified: Secondary | ICD-10-CM | POA: Diagnosis not present

## 2023-05-21 DIAGNOSIS — E876 Hypokalemia: Secondary | ICD-10-CM | POA: Diagnosis not present

## 2023-05-21 MED ORDER — ONDANSETRON HCL 4 MG/2ML IJ SOLN: 4.0000 mg | Freq: Four times a day (QID) | INTRAMUSCULAR | Status: DC | PRN

## 2023-05-21 NOTE — Progress Notes (Signed)
 Mobility Specialist Progress Note:    05/21/23 1250  Mobility  Activity Ambulated with assistance in hallway  Level of Assistance Independent  Assistive Device None  Distance Ambulated (ft) 300 ft  Range of Motion/Exercises Active;All extremities  Activity Response Tolerated well  Mobility Referral Yes  Mobility visit 1 Mobility  Mobility Specialist Start Time (ACUTE ONLY) 1250  Mobility Specialist Stop Time (ACUTE ONLY) 1307  Mobility Specialist Time Calculation (min) (ACUTE ONLY) 17 min   Pt received in bed, agreeable to mobility. Independently able to stand and ambulate with no AD. Tolerated well, asx throughout. Returned pt supine, all needs met.  Loreal Schuessler Mobility Specialist Please contact via Special educational needs teacher or  Rehab office at [phone]

## 2023-05-21 NOTE — Progress Notes (Addendum)
 Pa Progress Note   Patient: Mike [facility]:[id number] DOB: Dec 10, 1955 DOA: 05/19/2023     1 DOS: the patient was seen and examined on 05/21/2023   Brief hospital course: [name] was admitted to the hospital with the working diagnosis of esophageal stricture.   68 yo male with the past medical history of anxiety, depression, who presented with dysphagia and odynophagia. Apparently he had syncope episode when eating at the time when he was having difficulty swallowing.  Patient was evaluated by GI in the ED and underwent esophageal dilatation.  On his initial physical examination his blood pressure was 131/80, HR 52, RR 16 and 02 saturation 99%.  Lungs with no wheezing or rales, heart with S1 and S2 present and regular with no gallops, rubs or murmurs, abdomen with no distention, no lower extremity edema.   04/12 dysphagia symptoms are improving but continue with no consistent po intake.   Assessment and Plan: * Achalasia EGD with esophageal dilatation, hypertonic lower esophageal sphincter and was dilatated.  Esophagogram with positive dysmotility with limited primary wave and tertiary peristaltic activity and stasis of ingested bolus contrast.   Plan to continue with soft diet today.  Advice to have small portions and to eat slowly.  Follow up with tertiary care for esophageal manometry   Pain control with as needed morphine.  Esophageal pain triggering vagal response and provoking syncope.  No further syncope episode.   Gastritis and gastroduodenitis Continue with po pantoprazole.   Hypokalemia Electrolytes have improved.  Continue to monitor po intake.   Plan to continue close follow up as outpatient.   Chronic anemia Cell count stable with Hgb at 11.0 Follow up as outpatient/   GAD (generalized anxiety disorder) Continue with trazodone for sleep.         Subjective: Patient was able to eat last night but not this morning. No chest pain, no dyspnea.   Physical  Exam: Vitals:   05/20/23 1255 05/20/23 1317 05/20/23 1920 05/21/23 0449  BP: 106/80 94/61 (!) 108/58 (!) 110/57  Pulse: 73 70 74 63  Resp:   16 16  Temp: 98.7 F (37.1 C)  97.8 F (36.6 C) 98.9 F (37.2 C)  TempSrc: Oral  Oral   SpO2: 97%  95% 95%  Weight:      Height:       Neurology awake and alert ENT with mild pallor  Cardiovascular with S1 and S2 present and regular with no gallops, rubs or murmurs Respiratory with no rales or wheezing, or rhonchi  Abdomen with no distention  No lower extremity edema  Data Reviewed:    Family Communication: no family at the bedside   Disposition: Status is: Inpatient Remains inpatient appropriate because: plan to discharge home when able to tolerate po more consistently   Planned Discharge Destination: Home     Author: Albertus Alt, MD 05/21/2023 1:15 PM  For on call review [email or web address].

## 2023-05-21 NOTE — Progress Notes (Signed)
 Mobility Specialist Progress Note:    05/21/23 0855  Mobility  Activity Ambulated with assistance in hallway  Level of Assistance Independent  Assistive Device None  Distance Ambulated (ft) 300 ft  Range of Motion/Exercises Active;All extremities  Activity Response Tolerated well  Mobility Referral Yes  Mobility visit 1 Mobility  Mobility Specialist Start Time (ACUTE ONLY) [id number]  Mobility Specialist Stop Time (ACUTE ONLY) 0915  Mobility Specialist Time Calculation (min) (ACUTE ONLY) 20 min   Pt received in bed, agreeable to mobility. Independently able to stand and ambulate with no AD. Tolerated well, asx throughout. Returned pt supine, all needs met.  [facility] Mobility Specialist Please contact via Special educational needs teacher or  Rehab office at [phone]

## 2023-05-22 ENCOUNTER — Inpatient Hospital Stay (HOSPITAL_COMMUNITY)

## 2023-05-22 DIAGNOSIS — K220 Achalasia of cardia: Secondary | ICD-10-CM

## 2023-05-22 DIAGNOSIS — D649 Anemia, unspecified: Secondary | ICD-10-CM | POA: Diagnosis not present

## 2023-05-22 DIAGNOSIS — E876 Hypokalemia: Secondary | ICD-10-CM | POA: Diagnosis not present

## 2023-05-22 DIAGNOSIS — K2970 Gastritis, unspecified, without bleeding: Secondary | ICD-10-CM | POA: Diagnosis not present

## 2023-05-22 MED ORDER — PANTOPRAZOLE SODIUM 40 MG PO TBEC: 40.0000 mg | DELAYED_RELEASE_TABLET | Freq: Every day | ORAL | Status: DC

## 2023-05-22 MED ORDER — PANTOPRAZOLE SODIUM 40 MG PO TBEC: 40.0000 mg | DELAYED_RELEASE_TABLET | Freq: Every day | ORAL | 0 refills | Status: AC

## 2023-05-22 MED ORDER — HYDROCODONE-ACETAMINOPHEN 5-325 MG PO TABS: 1.0000 | ORAL_TABLET | Freq: Three times a day (TID) | ORAL | 0 refills | Status: DC | PRN

## 2023-05-22 MED ORDER — ENSURE ENLIVE PO LIQD: 237.0000 mL | Freq: Two times a day (BID) | ORAL | 12 refills | Status: AC

## 2023-05-22 NOTE — Progress Notes (Signed)
 Patient discharged home. All questions and concerns answered. Patient has all personal belongings including discharge paperwork. Patient ambulated downstairs to POV.

## 2023-05-22 NOTE — Discharge Summary (Addendum)
 Physician Discharge Summary   Patient: Mike [facility] MRN: [id number] DOB: [birth year]  Admit date:     05/19/2023  Discharge date: 05/22/23  Discharge Physician: [facility]   PCP: Pcp, No   Recommendations at discharge:    Patient was instructed to continue with soft diet and eat small portions at the time.  Cut food in small pieces and chew food thoroughly to avoid regurgitation episodes.  Placed on pantoprazole and stopped ibuprofen and aspirin. Follow up with GI as outpatient, appointment with [facility] per GI consultant Dr. Alita [facility].  Patient will need repeat EGD as outpatient, and esophageal manometry.  Follow up on esophageal biopsies   I spoke with patient's daughter over the phone we talked in detail about patient's condition, plan of care and prognosis and all questions were addressed.   Discharge Diagnoses: Principal Problem:   Achalasia Active Problems:   Gastritis and gastroduodenitis   Hypokalemia   Chronic anemia   GAD (generalized anxiety disorder)   Esophageal stricture  Resolved Problems:   * No resolved [facility] problems. [facility] Course: Mike [facility] was admitted to the [facility] with the working diagnosis of achalasia and esophageal dysmotility.   68 yo male with the past medical history of anxiety, depression, who presented with dysphagia and odynophagia. Apparently he had syncope episode while eating, at that time he was having difficulty swallowing.  Patient was evaluated by GI in the ED and underwent esophageal dilatation.  On his initial physical examination his blood pressure was 131/80, HR 52, RR 16 and 02 saturation 99%.  Lungs with no wheezing or rales, heart with S1 and S2 present and regular with no gallops, rubs or murmurs, abdomen with no distention, no lower extremity edema.   Na 136, K 3,3 Cl 105 bicarbonate 24 glucose 127, bun 16 cr 0,60  Wbc 9,3 hgb 10,8 plt 306   Head CT with no acute changes CT chest abdomen and pelvis with  contrast with no acute findings within the chest, abdomen and pelvis. No mass or lymphadenopathy noted.  Dilatated esophagus is identified to the level of the carina.  Cholelithiasis.  Right adrenal gland mass containing foci of macroscopic fat is again noted mildly increased in size from previous exam. Favoring benign adrenal myelolipoma.   EKG 74 bpm, left axis deviation, normal intervals, qtc 428, sinus rhythm with 1st degree AV block with PAC, poor R R wave progression with no significant ST segment or T wave changes.   04/12 dysphagia symptoms are improving but continue with no consistent po intake.  04/13 patient able to tolerate small bites, pain is controlled with analgesics.  Plan to follow up as outpatient GI in [facility] per referral from GI.   Assessment and Plan: * Achalasia EGD with esophageal dilatation, hypertonic lower esophageal sphincter and was dilatated.  Esophagogram with positive dysmotility with limited primary wave and tertiary peristaltic activity and stasis of ingested bolus contrast.   Patient will continue soft diet with small bites at a time, plan for follow up  with tertiary care for esophageal manometry and further interventions.   Pain control with as needed hydrocodone/ acetaminophen   Esophageal pain triggering vagal response and provoking syncope.  No further syncope episode.   Gastritis and gastroduodenitis Continue with po pantoprazole.   Hypokalemia Electrolytes were corrected.  Her discharge serum cr is  136, K 3,9 and CL 108 and serum bicarbonate at 24  Na 136   Chronic anemia Cell count stable with Hgb at 11.0  Mild iron deficiency anemia with serum iron 52, TIBC 318 and ferritin 34 with transferrin saturation at 16 Considering current dyspepsia and dysphagia will hold on iron supplementation until follow up as outpatient.    GAD (generalized anxiety disorder) Continue with trazodone for sleep.        Consultants: GI  Procedures  performed: EGD with esophageal dilatation   Disposition: Home Diet recommendation:  Regular diet small bites.  DISCHARGE MEDICATION: Allergies as of 05/22/2023       Reactions   Codeine Nausea And Vomiting   Lexapro [escitalopram Oxalate] Other (See Comments)   lethargic        Medication List     STOP taking these medications    aspirin EC 325 MG tablet   ibuprofen 200 MG tablet Commonly known as: ADVIL       TAKE these medications    feeding supplement Liqd Take 237 mLs by mouth 2 (two) times daily between meals.   HYDROcodone-acetaminophen 5-325 MG tablet Commonly known as: NORCO/VICODIN Take 1 tablet by mouth every 8 (eight) hours as needed for severe pain (pain score 7-10).   multivitamin with minerals tablet Take 1 tablet by mouth daily.   pantoprazole 40 MG tablet Commonly known as: PROTONIX Take 1 tablet (40 mg total) by mouth daily. Start taking on: May 23, 2023        Discharge Exam: [facility] Weights   05/19/23 1232  Weight: 52.2 kg   BP (!) 94/58 (BP Location: Left Arm)   Pulse (!) 52   Temp 97.9 F (36.6 C)   Resp 18   Ht 6' (1.829 m)   Wt 52.2 kg   SpO2 97%   BMI 15.60 kg/m   Patient is able to eat with small bites and small portions, his chest pain is controlled with analgesics, no further syncope episodes   Neurology awake and alert ENT with mild pallor with no icterus Cardiovascular with S1 and S2 present and regular with no gallops, rubs or murmurs Respiratory with no rales or wheezing Abdomen with no distention  No lower extremity edema   Condition at discharge: stable  The results of significant diagnostics from this hospitalization (including imaging, microbiology, ancillary and laboratory) are listed below for reference.   Imaging Studies: US  Abdomen Limited RUQ (LIVER/GB) Result Date: 05/22/2023 CLINICAL DATA:  [id number] Transaminitis [id number] EXAM: ULTRASOUND ABDOMEN LIMITED RIGHT UPPER QUADRANT COMPARISON:  CT abdomen  pelvis 01/04/2021, CT abdomen pelvis 05/19/2023 FINDINGS: Gallbladder: Calcified gallstone within the gallbladder lumen. No gallbladder wall thickening or pericholecystic fluid visualized. No sonographic Murphy sign noted by sonographer. Common bile duct: Diameter: 3 mm Liver: No focal lesion identified. Within normal limits in parenchymal echogenicity. Portal vein is patent on color Doppler imaging with normal direction of blood flow towards the liver. Other: None. IMPRESSION: Cholelithiasis with no acute cholecystitis. Electronically Signed   By: Morgane  Naveau M.D.   On: 05/22/2023 11:13   ECHOCARDIOGRAM COMPLETE Result Date: 05/20/2023    ECHOCARDIOGRAM REPORT   Patient Name:   ATHA MCBAIN Date of Exam: 05/20/2023 Medical Rec #:  [id number]      Height:       72.0 in Accession #:    [id number]     Weight:       115.0 lb Date of Birth:  11/08/1955      BSA:          1.684 m Patient Age:    67 years       BP:  110/60 mmHg Patient Gender: M              HR:           56 bpm. Exam Location:  [facility] Procedure: 2D Echo, Cardiac Doppler and Color Doppler (Both Spectral and Color            Flow Doppler were utilized during procedure). Indications:    R55 Syncope  History:        Patient has prior history of Echocardiogram examinations, most                 recent 03/26/2020. Arrythmias:Bradycardia, Signs/Symptoms:History                 of alcohol abuse; Risk Factors:Former Smoker. Per patient,                 recent drastic weight loss.  Sonographer:    Reed Canes RCS, RVS Referring Phys: [id number] DAWO[address] S ELGERGAWY  Sonographer Comments: Technically difficult study due to poor echo windows and no apical window. Patient is very thin, with ribs close together. IMPRESSIONS  1. Limited study.  2. Left ventricular ejection fraction, by estimation, is 60 to 65%. The left ventricle has normal function. The left ventricle has no regional wall motion abnormalities. Left ventricular diastolic function could not  be evaluated.  3. Right ventricular systolic function is normal. The right ventricular size is mildly enlarged. Tricuspid regurgitation signal is inadequate for assessing PA pressure.  4. A small pericardial effusion is present. The pericardial effusion is anterior to the right ventricle.  5. The mitral valve is grossly normal. Trivial mitral valve regurgitation.  6. The aortic valve was not well visualized. Aortic valve regurgitation is not visualized.  7. The inferior vena cava is normal in size with greater than 50% respiratory variability, suggesting right atrial pressure of 3 mmHg. Comparison(s): A prior study was performed on 03/26/2020. Prior images reviewed side by side. LVEF remains normal range at 60-65%. Mildly dilated RV with normal contraction. Small anterior pericardial effusion. FINDINGS  Left Ventricle: Left ventricular ejection fraction, by estimation, is 60 to 65%. The left ventricle has normal function. The left ventricle has no regional wall motion abnormalities. The left ventricular internal cavity size was normal in size. There is  borderline left ventricular hypertrophy. Left ventricular diastolic function could not be evaluated. Right Ventricle: The right ventricular size is mildly enlarged. No increase in right ventricular wall thickness. Right ventricular systolic function is normal. Tricuspid regurgitation signal is inadequate for assessing PA pressure. Left Atrium: Left atrial size was normal in size. Right Atrium: Right atrial size was normal in size. Pericardium: A small pericardial effusion is present. The pericardial effusion is anterior to the right ventricle. Mitral Valve: The mitral valve is grossly normal. Trivial mitral valve regurgitation. Tricuspid Valve: The tricuspid valve is grossly normal. Tricuspid valve regurgitation is mild. Aortic Valve: The aortic valve was not well visualized. There is mild aortic valve annular calcification. Aortic valve regurgitation is not  visualized. Pulmonic Valve: The pulmonic valve was not well visualized. Pulmonic valve regurgitation is not visualized. Aorta: The aortic root is normal in size and structure. Venous: The inferior vena cava is normal in size with greater than 50% respiratory variability, suggesting right atrial pressure of 3 mmHg. IAS/Shunts: No atrial level shunt detected by color flow Doppler. Additional Comments: 3D was performed not requiring image post processing on an independent workstation and was indeterminate.  LEFT VENTRICLE PLAX 2D LVIDd:  3.40 cm LVIDs:         2.30 cm LV PW:         1.00 cm LV IVS:        1.00 cm LVOT diam:     2.20 cm LVOT Area:     3.80 cm  IVC IVC diam: 1.90 cm LEFT ATRIUM         Index LA diam:    2.40 cm 1.43 cm/m   AORTA Ao Root diam: 3.70 cm  SHUNTS Systemic Diam: 2.20 cm Nona Dell MD Electronically signed by Nona Dell MD Signature Date/Time: 05/20/2023/4:28:35 PM    Final    DG ESOPHAGUS W SINGLE CM (SOL OR THIN BA) Result Date: 05/20/2023 CLINICAL DATA:  68 year old male. Admitted for syncopal event that occurred while eating. Request is for esophagram for further evaluation of dysphagia EXAM: ESOPHAGUS/BARIUM SWALLOW/TABLET STUDY TECHNIQUE: Single contrast examination was performed using thin liquid barium. This exam was performed by Anders Grant NP and was supervised and interpreted by Dr. Marliss Coots FLUOROSCOPY: Radiation Exposure Index (as provided by the fluoroscopic device): 10.10 mGy Kerma COMPARISON:  CT chest abdomen pelvis dated May 19, 2023 FINDINGS: Swallowing: Appears normal. No vestibular penetration or aspiration seen. Pharynx: Unremarkable. Esophagus: Corkscrew appearance in the upper esophagus. Narrowing noted proximal to the GE junction. Stricture cannot be ruled out. Esophageal motility: Severe dysmotility with non propulsive tertiary contractions. Hiatal Hernia: None. Gastroesophageal reflux: None visualized. Ingested 13mm barium tablet: Not  given Other: None. IMPRESSION: Focused single-contrast exam performed. Signs of esophageal dysmotility with limited primary wave and tertiary peristaltic activity. Statis of ingested bolus of contrast in the esophagus. Electronically Signed   By: Marliss Coots M.D.   On: 05/20/2023 11:02   CT CHEST ABDOMEN PELVIS W CONTRAST Result Date: 05/19/2023 CLINICAL DATA:  Evaluate for metastatic disease. Loss of weight. Complains of difficulty swallowing. EXAM: CT CHEST, ABDOMEN, AND PELVIS WITH CONTRAST TECHNIQUE: Multidetector CT imaging of the chest, abdomen and pelvis was performed following the standard protocol during bolus administration of intravenous contrast. RADIATION DOSE REDUCTION: This exam was performed according to the departmental dose-optimization program which includes automated exposure control, adjustment of the mA and/or kV according to patient size and/or use of iterative reconstruction technique. CONTRAST:  OMNIPAQUE IOHEXOL 300 MG/ML  SOLN COMPARISON:  CT AP 01/04/2021 and CT angio chest 03/25/2020 FINDINGS: CT CHEST FINDINGS Cardiovascular: Heart size is normal. Aortic atherosclerosis and coronary artery calcifications. No pericardial effusion. Mediastinum/Nodes: Thyroid gland appears normal. Trachea appears patent and midline. Dilated patulous esophagus is identified to the level of the carina. No enlarged mediastinal or hilar lymph nodes. Lungs/Pleura: No pleural effusion, airspace consolidation, atelectasis or pneumothorax. Mild emphysema with diffuse bronchial wall thickening. Scarring noted within both lung bases. There are several, bilateral, upper lobe punctate lung nodules. Within the left apex nodule is unchanged measuring 3 mm, image 19/4. Punctate nodule in the periphery of the right upper lobe is unchanged measuring 2 mm, image 38/4. Punctate nodule in the left upper lobe measuring 2 mm is also unchanged, image 38/4. These are compatible with benign nodules require no further  follow-up. Musculoskeletal: No chest wall mass or suspicious bone lesions identified. CT ABDOMEN PELVIS FINDINGS Hepatobiliary: No suspicious liver lesions. Tiny stones within the dependent portion of the gallbladder. No wall thickening or inflammation. No bile duct dilatation. None Pancreas: Unremarkable. No pancreatic ductal dilatation or surrounding inflammatory changes. Spleen: Normal in size without focal abnormality. Adrenals/Urinary Tract: Mass in the right adrenal gland measures  5.1 by 4.2 by 4.7 cm. There is multiple scattered calcifications identified within this mass. Small focus of macroscopic fat is identified within the mass, image 64/2. Centrally the mass appears low attenuation with peripheral rim of increased soft tissue. On the examination from 03/28/2020 this measured 4.9 x 3.5 cm. Normal left adrenal gland. No kidney mass, nephrolithiasis or signs of obstructive uropathy. Urinary bladder is normal. Stomach/Bowel: Stomach is nondistended. No pathologic dilatation of the large or small bowel loops. Mild to moderate stool burden is noted within the colon and rectum. No bowel wall thickening or inflammation. Status post appendectomy. Vascular/Lymphatic: Extensive aortic atherosclerosis. No aneurysm. Patent abdominal vascularity. No abdominal adenopathy. Reproductive: Prostate is unremarkable. Other: No free fluid or fluid collections. No signs of pneumoperitoneum. Musculoskeletal: No acute or significant osseous findings. L5-S1 degenerative disc disease. IMPRESSION: 1. No acute findings within the chest, abdomen or pelvis. No mass or adenopathy noted within the chest, abdomen or pelvis. 2. Dilated patulous esophagus is identified to the level of the carina. 3. Right adrenal gland mass containing foci of macroscopic fat is again noted mildly increased in size from previous exam. 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 4. Cholelithiasis. 5. Coronary artery calcifications. 6. Aortic  Atherosclerosis (ICD10-I70.0) and Emphysema (ICD10-J43.9). Electronically Signed   By: Kimberley Penman M.D.   On: 05/19/2023 08:15   CT HEAD WO CONTRAST ( ) Result Date: 05/19/2023 CLINICAL DATA:  Head trauma, moderate to severe. Difficulty eating for several months because E insert up choking. EXAM: CT HEAD WITHOUT CONTRAST TECHNIQUE: Contiguous axial images were obtained from the base of the skull through the vertex without intravenous contrast. RADIATION DOSE REDUCTION: This exam was performed according to the departmental dose-optimization program which includes automated exposure control, adjustment of the mA and/or kV according to patient size and/or use of iterative reconstruction technique. COMPARISON:  11/28/2013 FINDINGS: Brain: No evidence of acute infarction, hemorrhage, hydrocephalus, extra-axial collection or mass lesion/mass effect. Vascular: No hyperdense vessel or unexpected calcification. Skull: Normal. Negative for fracture or focal lesion. Sinuses/Orbits: Paranasal sinuses and mastoid air cells are clear. Other: None. IMPRESSION: No acute intracranial abnormalities. Electronically Signed   By: Kimberley Penman M.D.   On: 05/19/2023 08:01    Microbiology: Results for orders placed or performed during the [facility] encounter of 01/04/21  Resp Panel by RT-PCR (Flu A&B, Covid) Nasopharyngeal Swab     Status: None   Collection Time: 01/04/21  5:40 AM   Specimen: Nasopharyngeal Swab; Nasopharyngeal(NP) swabs in vial transport medium  Result Value Ref Range Status   SARS Coronavirus 2 by RT PCR NEGATIVE NEGATIVE Final    Comment: (NOTE) SARS-CoV-2 target nucleic acids are NOT DETECTED.  The SARS-CoV-2 RNA is generally detectable in upper respiratory specimens during the acute phase of infection. The lowest concentration of SARS-CoV-2 viral copies this assay can detect is 138 copies/mL. A negative result does not preclude SARS-Cov-2 infection and should not be used as the sole basis for  treatment or other patient management decisions. A negative result may occur with  improper specimen collection/handling, submission of specimen other than nasopharyngeal swab, presence of viral mutation(s) within the areas targeted by this assay, and inadequate number of viral copies(<138 copies/mL). A negative result must be combined with clinical observations, patient history, and epidemiological information. The expected result is Negative.  Fact Sheet for Patients:  BloggerCourse.com  Fact Sheet for Healthcare Providers:  SeriousBroker.it  This test is no t yet approved or cleared by the [place]  FDA  and  has been authorized for detection and/or diagnosis of SARS-CoV-2 by FDA under an Emergency Use Authorization (EUA). This EUA will remain  in effect (meaning this test can be used) for the duration of the COVID-19 declaration under Section 564(b)(1) of the Act, 21 U.S.C.section 360bbb-3(b)(1), unless the authorization is terminated  or revoked sooner.       Influenza A by PCR NEGATIVE NEGATIVE Final   Influenza B by PCR NEGATIVE NEGATIVE Final    Comment: (NOTE) The Xpert Xpress SARS-CoV-2/FLU/RSV plus assay is intended as an aid in the diagnosis of influenza from Nasopharyngeal swab specimens and should not be used as a sole basis for treatment. Nasal washings and aspirates are unacceptable for Xpert Xpress SARS-CoV-2/FLU/RSV testing.  Fact Sheet for Patients: BloggerCourse.com  Fact Sheet for Healthcare Providers: SeriousBroker.it  This test is not yet approved or cleared by the [place]  FDA and has been authorized for detection and/or diagnosis of SARS-CoV-2 by FDA under an Emergency Use Authorization (EUA). This EUA will remain in effect (meaning this test can be used) for the duration of the COVID-19 declaration under Section 564(b)(1) of the Act, 21  U.S.C. section 360bbb-3(b)(1), unless the authorization is terminated or revoked.  Performed at [facility], [address] [place], [place] [address]     Labs: CBC: Recent Labs  Lab 05/19/23 0610 05/20/23 0442  WBC 9.3 6.6  HGB 10.8* 11.0*  HCT 33.1* 33.2*  MCV 89.9 90.5  PLT 306 330   Basic Metabolic Panel: Recent Labs  Lab 05/19/23 0610 05/20/23 0442  NA 136 136  K 3.3* 3.9  CL 105 108  CO2 24 24  GLUCOSE 127* 102*  BUN 16 13  CREATININE 0.60* 0.70  CALCIUM 9.0 8.8*  MG 2.0  --    Liver Function Tests: Recent Labs  Lab 05/19/23 0610  AST 21  ALT 14  ALKPHOS 49  BILITOT 0.5  PROT 6.7  ALBUMIN 3.8   CBG: No results for input(s): "GLUCAP" in the last 168 hours.  Discharge time spent: greater than 30 minutes.  Signed: Albertus Alt, MD [facility] 05/22/2023

## 2023-05-22 NOTE — Plan of Care (Signed)
  Problem: Education: Goal: Knowledge of General Education information will improve Description: Including pain rating scale, medication(s)/side effects and non-pharmacologic comfort measures Outcome: Progressing   Problem: Health Behavior/Discharge Planning: Goal: Ability to manage health-related needs will improve Outcome: Progressing   Problem: Clinical Measurements: Goal: Ability to maintain clinical measurements within normal limits will improve Outcome: Progressing Goal: Will remain free from infection Outcome: Progressing   Problem: Activity: Goal: Risk for activity intolerance will decrease Outcome: Progressing   Problem: Elimination: Goal: Will not experience complications related to bowel motility Outcome: Progressing   Problem: Pain Managment: Goal: General experience of comfort will improve and/or be controlled Outcome: Progressing   Problem: Safety: Goal: Ability to remain free from injury will improve Outcome: Progressing

## 2023-05-23 ENCOUNTER — Encounter (HOSPITAL_COMMUNITY): Payer: Self-pay | Admitting: Gastroenterology

## 2023-05-23 LAB — SURGICAL PATHOLOGY

## 2023-05-23 NOTE — Telephone Encounter (Signed)
 OV made for 4/28 at 1130 to see CM. Patient's VM is full, I mailed appointment letter.

## 2023-05-23 NOTE — Addendum Note (Signed)
 Addended by: Feliz Hosteller on: 05/23/2023 08:29 AM   Modules accepted: Orders

## 2023-05-23 NOTE — Telephone Encounter (Signed)
Stat referral sent.

## 2023-05-24 NOTE — Progress Notes (Signed)
 I reviewed the pathology results. Ann, can you send her a letter with the findings as described below please?  Thanks,  [name], MD Gastroenterology and Hepatology [facility]  ---------------------------------------------------------------------------------------------  [facility] [address], [place], [place] [address] Phone:  [phone]   05/24/23 Mike Moran, [place]   Dear Mike Moran,  I am writing to inform you that the biopsies taken during your recent endoscopic examination showed:  A. GASTRIC, BIOPSY:  - Mild reactive gastropathy.  - Negative for H. pylori on HE stain  - No intestinal metaplasia, dysplasia, or malignancy.   B. ESOPHAGEAL, BIOPSY:  - Squamous mucosa with no specific pathologic changes  - Negative for increased intraepithelial eosinophils  - Negative for dysplasia or malignancy    What does this mean?  No H. Pylori bacteria in stomach , or any early cancer changes to the stomach mucosa ( Intestinal metaplasia)  Normal biopsies of the food-pipe ( no eosinophilic esophagitis )   Next step is to have esophageal manometry done to evaluate the movement of the esophagus and possibly completing colonoscopy given significant weight loss   We look forward to see you on 06/06/2023  Also I value your feedback , so if you get a survey , please take the time to fill it out and thank you for choosing [facility]  Please call us  at [phone] if you have persistent problems or have questions about your condition that have not been fully answered at this time.  Sincerely,  [name], MD Gastroenterology and Hepatology

## 2023-05-30 ENCOUNTER — Encounter (INDEPENDENT_AMBULATORY_CARE_PROVIDER_SITE_OTHER): Payer: Self-pay | Admitting: *Deleted

## 2023-05-30 NOTE — Anesthesia Postprocedure Evaluation (Signed)
 Anesthesia Post Note  Patient: Georgean Kindle  Procedure(s) Performed: EGD (ESOPHAGOGASTRODUODENOSCOPY)  Patient location during evaluation: Phase II Anesthesia Type: General Level of consciousness: awake Pain management: pain level controlled Vital Signs Assessment: post-procedure vital signs reviewed and stable Respiratory status: spontaneous breathing and respiratory function stable Cardiovascular status: blood pressure returned to baseline and stable Postop Assessment: no headache and no apparent nausea or vomiting Anesthetic complications: no Comments: Late entry   No notable events documented.   Last Vitals:  Vitals:   05/21/23 2113 05/22/23 0450  BP: (!) 165/97 (!) 94/58  Pulse: 77 (!) 52  Resp: 20 18  Temp: 36.8 C 36.6 C  SpO2: 98% 97%    Last Pain:  Vitals:   05/22/23 1216  TempSrc:   PainSc: 10-Worst pain ever                 Coretha Dew

## 2023-06-03 NOTE — Progress Notes (Unsigned)
 GI Office Note    Referring Provider: No ref. provider found Primary Care Physician:  Pcp, No Primary Gastroenterologist: Muhammad Faizan Ahmed, MD  Date:  06/06/2023  ID:  Mike Moran, DOB Jun 07, 1955, MRN [id number]   Chief Complaint   Chief Complaint  Patient presents with   Follow-up    Follow up from hospital. Pt states he is no better   History of Present Illness  Mike Moran is a 68 y.o. male with a history of anxiety/depression with prior SI in 2017, appendectomy in 2022, perirectal abscess in 2008 presenting today for hospital follow-up of 68-month of dysphagia and odynophagia.  EGD Feb 2008: -esophageal ring s/p dilation -normal stomach and duodenum.   Colonoscopy 2008: - two polyps (cecum and transverse) -small internal/external hemorrhoids  Seen recently inpatient for reports of dysphagia and odynophagia.  Underwent EGD as noted below.  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  EGD 05/19/23: - Dilation in the entire esophagus.  - Hypertonic lower esophageal sphincter, with mild pluckering s/p Dilation.  - A medium amount of food ( residue) in the stomach.  - A single submucosal papule ( nodule) found in the stomach. likely pancreatic. - Normal duodenal bulb and second portion of the duodenum.  - Biopsies were taken with a cold forceps for evaluation of eosinophilic esophagitis.  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  -Stasis of ingested bolus of contrast in the esophagus. - Corkscrew  appearance in the upper esophagus  Today:  He has tried to eat food and within 5 minutes it is coming back up and not able to hold it down.   Some days he is able to keep a little food down and let it sneak down but most days he is vomiting up things. States he has ben able to eat candy bars and kit kats and things similar to that but not able to eat much other solid foods. Taking a multivitamin and aspirin  daily.   Does not like to take advil  but has been tacking some mylanta because he is having spasms in his chest and they are sharp and painful and is sore the rest of the day. The pain is 24/7 and some das are better than others.   Sometimes able to keep liquids down. Will sip on boost trying to get it down. Sometimes though gets choked on that as well. He loves fish and states that goes down okay.   Wt Readings from Last 6 Encounters:  06/06/23 123 lb 9.6 oz (56.1 kg)  05/19/23 115 lb (52.2 kg)  01/13/21 128 lb (58.1 kg)  01/04/21 145 lb (65.8 kg)  11/13/20 124 lb (56.2 kg)  10/31/20 123 lb 0.3 oz (55.8 kg)    Current Outpatient Medications  Medication Sig Dispense Refill   feeding supplement (ENSURE ENLIVE / ENSURE PLUS) LIQD Take 237 mLs by mouth 2 (two) times daily between meals. 237 mL 12   Multiple Vitamins-Minerals (MULTIVITAMIN WITH MINERALS) tablet Take 1 tablet by mouth daily.     pantoprazole  (PROTONIX ) 40 MG tablet Take 1 tablet (40  mg total) by mouth daily. 30 tablet 0   HYDROcodone -acetaminophen  (NORCO/VICODIN) 5-325 MG tablet Take 1 tablet by mouth every 8 (eight) hours as needed for severe pain (pain score 7-10). (Patient not taking: Reported on 06/06/2023) 18 tablet 0   No current facility-administered medications for this visit.    Past Medical History:  Diagnosis Date   Anxiety    Depression     Past Surgical History:  Procedure Laterality Date   ESOPHAGOGASTRODUODENOSCOPY N/A 05/19/2023   Procedure: EGD (ESOPHAGOGASTRODUODENOSCOPY);  Surgeon: Hargis Lias, MD;  Location: [facility];  Service: Endoscopy;  Laterality: N/A;   INGUINAL HERNIA REPAIR Right 11/05/2020   Procedure: HERNIA REPAIR INGUINAL ADULT WITH MESH;  Surgeon: Alanda Allegra, MD;  Location: [facility] ORS;  Service: General;  Laterality: Right;   LAPAROSCOPIC APPENDECTOMY N/A 01/04/2021   Procedure: APPENDECTOMY LAPAROSCOPIC;  Surgeon: Alanda Allegra, MD;  Location: [facility] ORS;  Service: General;  Laterality: N/A;    No family history on file.  Allergies as of 06/06/2023 - Review Complete 06/06/2023  Allergen Reaction Noted   Codeine Nausea And Vomiting 11/28/2013   Lexapro [escitalopram oxalate] Other (See Comments) 01/09/2014    Social History   Socioeconomic History   Marital status: Married    Spouse name: Not on file   Number of children: Not on file   Years of education: Not on file   Highest education level: Not on file  Occupational History   Not on file  Tobacco Use   Smoking status: Former   Smokeless tobacco: Never  Vaping Use   Vaping status: Never Used  Substance and Sexual Activity   Alcohol use: Yes    Alcohol/week: 0.0 standard drinks of alcohol    Comment: occ- pt denies SA use   Drug use: No   Sexual activity: Not on file  Other Topics Concern   Not on file  Social History Narrative   Not on file   Social Drivers of Health   Financial Resource Strain: Not on file  Food Insecurity: No Food Insecurity (05/19/2023)   Hunger Vital Sign    Worried About Running Out of Food in the Last Year: Never true    Ran Out of Food in the Last Year: Never true  Transportation Needs: No Transportation Needs (05/19/2023)   PRAPARE - Administrator, Civil Service (Medical): No    Lack of Transportation (Non-Medical): No  Physical Activity: Not on file  Stress: Not on file  Social Connections: Socially Integrated (05/19/2023)   Social Connection and Isolation Panel [NHANES]    Frequency of Communication with Friends and Family: Twice a week     Frequency of Social Gatherings with Friends and Family: Twice a week    Attends Religious Services: 1 to 4 times per year    Active Member of [employer] or Organizations: No    Attends Engineer, structural: 1 to 4 times per year    Marital Status: Married   Review of Systems   Gen: + abdominal pain. Denies fever, chills, anorexia. Denies fatigue, weakness, weight loss.  CV: Denies chest pain, palpitations, syncope, peripheral edema, and claudication. Resp: Denies dyspnea at rest, cough, wheezing, coughing up blood, and pleurisy. GI: See HPI Derm: Denies rash, itching, dry skin Psych: Denies depression, anxiety, memory loss, confusion. No homicidal or suicidal ideation.  Heme: Denies bruising, bleeding, and enlarged lymph nodes.  Physical Exam   BP 100/62   Pulse 72   Temp 98.5 F (36.9  C)   Ht 6' (1.829 m)   Wt 123 lb 9.6 oz (56.1 kg)   BMI 16.76 kg/m   General:   Alert and oriented. No distress noted. Pleasant and cooperative.  Head:  Normocephalic and atraumatic. Eyes:  Conjuctiva clear without scleral icterus. Ears: hard of hearing Abdomen:  +BS, soft, non-tender and non-distended. No rebound or guarding. No HSM or masses noted. Rectal: deferred Msk:  Symmetrical without gross deformities. Normal posture. Extremities:  Without edema. Neurologic:  Alert and  oriented x4 Psych:  Alert and cooperative. Normal mood and affect.  Assessment  Mike Moran is a 68 y.o. male with a history of anxiety/depression with prior SI in 2017, appendectomy in 2022, perirectal abscess in 2008 presenting today for hospital follow-up of 52-month of dysphagia and odynophagia.   Dysphagia, odynophagia, concern for achalasia: Prior to recent hospitalization, reported about 6 months of symptoms.  Has had history of EGD in 2008 requiring dilation for esophageal ring.  Currently on PPI once daily.  During hospitalization noted no recent NSAIDs, today he admits to in some occasional Advil .   Denies any typical reflux symptoms.  Has been having mucus production in the mornings with frequent spitting up as well as frequent regurgitation of food shortly after meals.  Able to tolerate candy at times as well as some liquids and some solids such as fish or other softer foods.  Unable to tolerate bread or large amounts of meats.  Sometimes if he takes really small bites and chews a significant amount then things such as fish and other softer foods go down easier with less regurgitation.  Sometimes has significant regurgitation even with liquids such as water .  Denies any significant globus sensation or issues with medications however he has been crushing up these since hospitalization.  EGD performed with completely dilated esophagus as well as hypertonic sphincter with mild puckering, dilation performed but has not had any improvement.  There was a moderate amount of food residue within the stomach as well.  Follow-up BPE with limited primary wave and tertiary peristaltic activity and stasis of contrast bolus within the esophagus.  Esophagus appeared to be corkscrew in appearance.  Encouraged to continue at least 3 boost daily to help with protein intake and help maintain weight, likely despite his troubles over the last 6 months he has been maintaining a stable weight.  Will give urgent referral to [facility] for manometry as well as discussing treatment for possible achalasia.  CT chest abdomen pelvis report with no evidence of any mediastinal or chest mass as cause of the symptoms.  Lung nodules were present however.  He was also noted to be constipated.  Anemia: Per review of chart this is chronic issue dating back to at least February 2022. Recently 10.8 during hospitalization.  Denies any melena or BRBPR.  Does admit to occasional Advil  use.  No recent alcohol use.  EGD performed on inpatient with a moderate amount of food residue within the stomach, unable to evaluate for any potential cause of anemia  although this was not the purpose of this upper endoscopy.  Will likely need repeat EGD as well as an eventual colonoscopy after addressing his upper GI symptoms.  Recent iron studies within normal limits other than mildly low iron saturation at 16%, iron 52, ferritin normal at 34.  Likely partially secondary to poor p.o. intake.  Currently on multivitamin.  Will repeat CBC in 2 months to assess for stability  History of colon polyps: History of  colon polyps in 2008.  Has not had follow-up colonoscopy since then.  Well overdue for surveillance at this point however given his upper GI symptoms, will need to sort this out because he would not be able to tolerate colonoscopy prep at this time.  PLAN   ASAP referral to [facility] for manometry and possible treatment for achalasia.  If unable to get patient in a timely manner, may refer to [facility] in [place] Continue full liquid diet, limited tolerable soft foods Carafate  TID as needed for throat pain. Continue pantoprazole  40 mg once daily Continue to crush pills as able. Melatonin for sleep Supplement with boost 2-3 daily.  Plan for eventual colonoscopy after addressing upper GI symptoms. Repeat CBC in 2 months.    Julian Obey, MSN, FNP-BC, AGACNP-BC [facility]

## 2023-06-06 ENCOUNTER — Other Ambulatory Visit: Payer: Self-pay | Admitting: *Deleted

## 2023-06-06 ENCOUNTER — Telehealth: Payer: Self-pay | Admitting: Gastroenterology

## 2023-06-06 ENCOUNTER — Encounter: Payer: Self-pay | Admitting: Gastroenterology

## 2023-06-06 ENCOUNTER — Ambulatory Visit (INDEPENDENT_AMBULATORY_CARE_PROVIDER_SITE_OTHER): Admitting: Gastroenterology

## 2023-06-06 VITALS — BP 100/62 | HR 72 | Temp 98.5°F | Ht 72.0 in | Wt 123.6 lb

## 2023-06-06 DIAGNOSIS — R1310 Dysphagia, unspecified: Secondary | ICD-10-CM

## 2023-06-06 DIAGNOSIS — D649 Anemia, unspecified: Secondary | ICD-10-CM | POA: Diagnosis not present

## 2023-06-06 DIAGNOSIS — Z860100 Personal history of colon polyps, unspecified: Secondary | ICD-10-CM

## 2023-06-06 DIAGNOSIS — K219 Gastro-esophageal reflux disease without esophagitis: Secondary | ICD-10-CM

## 2023-06-06 DIAGNOSIS — G4700 Insomnia, unspecified: Secondary | ICD-10-CM

## 2023-06-06 MED ORDER — MELATONIN 5 MG/15ML PO LIQD: 15.0000 mL | Freq: Every evening | ORAL | 2 refills | Status: AC

## 2023-06-06 MED ORDER — SUCRALFATE 1 GM/10ML PO SUSP: 1.0000 g | Freq: Four times a day (QID) | ORAL | 2 refills | Status: AC

## 2023-06-06 NOTE — Telephone Encounter (Signed)
 Pt left office before making appt called pt no answer mailbox full put recall in for FU 3 months

## 2023-06-06 NOTE — Patient Instructions (Addendum)
 Please contact [facility] at [phone] to make a new patient appointment.  This would be a general doctor that you would give see periodically for any complaint or for regular checkups every year.  They can help manage some of your other chronic pain not related to your throat and swallowing issue.  They also may be able to prescribe you something more prescription for insomnia if the melatonin does not work.  Allergy to start taking melatonin nightly, 1 hour prior to bed.  I will send in prescription however if you are not able to obtain this, I would recommend you picking this up from [facility], [facility], or [facility].  Any 3-5 mg dose would work to help you with sleep.  I want you to drink at least 2-3 boost daily to help with protein intake to avoid any weight loss given your swallowing issue.  Continue mostly a full liquid diet, you may continue to eat some small amounts of fish or soup as you are able to tolerate.  Our office is sending an ASAP referral to [facility] for you to have manometry done and discuss possible treatment for achalasia (issue where the muscles in your esophagus do not work adequately).  I have also sent in Carafate for you to take 3 times a day as needed for pain in your chest.  If you do not hear from [facility] within the next 1-2 weeks please call [phone] or [phone] to ask about your referral and an appointment.   Follow up in 3 months.   It was a pleasure to see you today. I want to create trusting relationships with patients. If you receive a survey regarding your visit,  I greatly appreciate you taking time to fill this out on paper or through your MyChart. I value your feedback.  Julian Obey, MSN, FNP-BC, AGACNP-BC [facility]

## 2023-06-07 ENCOUNTER — Other Ambulatory Visit: Payer: Self-pay

## 2023-06-07 ENCOUNTER — Encounter (HOSPITAL_COMMUNITY): Payer: Self-pay

## 2023-06-07 ENCOUNTER — Emergency Department (HOSPITAL_COMMUNITY)

## 2023-06-07 ENCOUNTER — Emergency Department (HOSPITAL_COMMUNITY)
Admission: EM | Admit: 2023-06-07 | Discharge: 2023-06-08 | Disposition: A | Discharge status: Home / Self Care | Attending: Emergency Medicine | Admitting: Emergency Medicine

## 2023-06-07 DIAGNOSIS — S99921A Unspecified injury of right foot, initial encounter: Secondary | ICD-10-CM | POA: Diagnosis present

## 2023-06-07 DIAGNOSIS — W293XXA Contact with powered garden and outdoor hand tools and machinery, initial encounter: Secondary | ICD-10-CM | POA: Diagnosis not present

## 2023-06-07 DIAGNOSIS — Z23 Encounter for immunization: Secondary | ICD-10-CM | POA: Insufficient documentation

## 2023-06-07 DIAGNOSIS — S91311A Laceration without foreign body, right foot, initial encounter: Secondary | ICD-10-CM | POA: Diagnosis not present

## 2023-06-07 MED ORDER — TETANUS-DIPHTH-ACELL PERTUSSIS 5-2.5-18.5 LF-MCG/0.5 IM SUSY
0.5000 mL | PREFILLED_SYRINGE | Freq: Once | INTRAMUSCULAR | Status: AC
  Administered 2023-06-07: 0.5 mL via INTRAMUSCULAR
  Filled 2023-06-07: qty 0.5

## 2023-06-07 MED ORDER — HYDROCODONE-ACETAMINOPHEN 7.5-325 MG/15ML PO SOLN
10.0000 mL | Freq: Once | ORAL | Status: AC
  Administered 2023-06-07: 10 mL via ORAL
  Filled 2023-06-07: qty 15

## 2023-06-07 NOTE — ED Triage Notes (Signed)
 Pt reports foot lacerated by chainsaw about 2.5 hours pta.

## 2023-06-07 NOTE — ED Notes (Signed)
 Pt right foot soaked in warm water  with soap per EDP verbal order

## 2023-06-07 NOTE — ED Provider Notes (Signed)
 [place] EMERGENCY DEPARTMENT AT [facility] Provider Note   CSN: [id number] Arrival date & time: 06/07/23  2227     History {Add pertinent medical, surgical, social history, OB history to HPI:1} Chief Complaint  Patient presents with   Extremity Laceration    CHIDOZIE BREGENZER is a 68 y.o. male.  HPI     This is a 68 year old male who presents with an injury to the right foot.  Patient reports that he was using a chainsaw [place]  He accidentally stepped on the blade.  This happened approximately 3 hours prior to my evaluation.  He reports pain in the foot but has been ambulatory.  He was only wearing socks.  Unknown last tetanus.  Denies other injury.  Has not taken anything for pain.  Home Medications Prior to Admission medications   Medication Sig Start Date End Date Taking? Authorizing Provider  feeding supplement (ENSURE ENLIVE / ENSURE PLUS) LIQD Take 237 mLs by mouth 2 (two) times daily between meals. 05/22/23   Arrien, Mauricio Daniel, MD  HYDROcodone -acetaminophen  (NORCO/VICODIN) 5-325 MG tablet Take 1 tablet by mouth every 8 (eight) hours as needed for severe pain (pain score 7-10). Patient not taking: Reported on 06/06/2023 05/22/23   Arrien, Mauricio Daniel, MD  Melatonin 5 MG/15ML LIQD Take 15 mLs (5 mg total) by mouth at bedtime. Take 1 hour prior to bed. 06/06/23   Eustacio Highman, NP  Multiple Vitamins-Minerals (MULTIVITAMIN WITH MINERALS) tablet Take 1 tablet by mouth daily.    [provider]  pantoprazole  (PROTONIX ) 40 MG tablet Take 1 tablet (40 mg total) by mouth daily. 05/23/23   Arrien, Mauricio Daniel, MD  sucralfate (CARAFATE) 1 GM/10ML suspension Take 10 mLs (1 g total) by mouth 4 (four) times daily. 06/06/23   Eustacio Highman, NP      Allergies    Codeine and Lexapro [escitalopram oxalate]    Review of Systems   Review of Systems  Constitutional:  Negative for fever.  Skin:  Positive for wound.  All other  systems reviewed and are negative.   Physical Exam Updated Vital Signs BP (!) 147/76   Pulse 97   Temp 98.6 F (37 C) (Temporal)   Resp 18   Ht 1.829 m (6')   Wt 55.8 kg   SpO2 97%   BMI 16.68 kg/m  Physical Exam Vitals and nursing note reviewed.  Constitutional:      Appearance: He is well-developed.     Comments: Elderly, thin, no acute distress  HENT:     Head: Normocephalic and atraumatic.  Eyes:     Pupils: Pupils are equal, round, and reactive to light.  Cardiovascular:     Rate and Rhythm: Normal rate and regular rhythm.     Heart sounds: Normal heart sounds. No murmur heard. Pulmonary:     Effort: Pulmonary effort is normal. No respiratory distress.     Breath sounds: No wheezing.  Abdominal:     Palpations: Abdomen is soft.  Musculoskeletal:        General: Normal range of motion.     Cervical back: Neck supple.     Comments: No midfoot tenderness palpation, 2+ DP pulse  Lymphadenopathy:     Cervical: No cervical adenopathy.  Skin:    General: Skin is warm and dry.     Comments: Jagged superficial avulsion lacerations on the plantar aspect of the foot just proximal to the great toe on the ball of  the foot, no active bleeding  Neurological:     Mental Status: He is alert and oriented to person, place, and time.  Psychiatric:        Mood and Affect: Mood normal.     ED Results / Procedures / Treatments   Labs (all labs ordered are listed, but only abnormal results are displayed) Labs Reviewed - No data to display  EKG None  Radiology No results found.  Procedures Procedures  {Document cardiac monitor, telemetry assessment procedure when appropriate:1}  Medications Ordered in ED Medications  HYDROcodone -acetaminophen  (HYCET) 7.5-325 mg/15 ml solution 10 mL (has no administration in time range)  Tdap (BOOSTRIX) injection 0.5 mL (has no administration in time range)    ED Course/ Medical Decision Making/ A&P   {   Click here for ABCD2, HEART  and other calculatorsREFRESH Note before signing :1}                              Medical Decision Making Amount and/or Complexity of Data Reviewed Radiology: ordered.  Risk Prescription drug management.   ***  {Document critical care time when appropriate:1} {Document review of labs and clinical decision tools ie heart score, Chads2Vasc2 etc:1}  {Document your independent review of radiology images, and any outside records:1} {Document your discussion with family members, caretakers, and with consultants:1} {Document social determinants of health affecting pt's care:1} {Document your decision making why or why not admission, treatments were needed:1} Final Clinical Impression(s) / ED Diagnoses Final diagnoses:  None    Rx / DC Orders ED Discharge Orders     None

## 2023-06-07 NOTE — ED Notes (Signed)
 Wound to bottom of foot cleansed with wound solution after soaking in soapy warm water .

## 2023-06-08 MED ORDER — KETOROLAC TROMETHAMINE 15 MG/ML IJ SOLN
15.0000 mg | Freq: Once | INTRAMUSCULAR | Status: AC
Start: 2023-06-08 — End: 2023-06-13
  Administered 2023-06-08: 15 mg via INTRAMUSCULAR
  Filled 2023-06-08: qty 1

## 2023-06-08 NOTE — Discharge Instructions (Signed)
 You were seen today for a laceration of the foot.  You should avoid using chainsaws or heavy equipment without appropriate clothing and protective wear including shoes.  Keep laceration clean and dry.  Apply antibiotic ointment.

## 2023-06-08 NOTE — ED Notes (Signed)
 Pt vomited up pain meds PO, new orders received from Dr Carylon Claude

## 2023-06-08 NOTE — ED Notes (Signed)
 Mepilex Border Flex Lite applied over wound

## 2023-07-12 ENCOUNTER — Encounter: Payer: Self-pay | Admitting: Gastroenterology

## 2023-07-16 ENCOUNTER — Emergency Department (HOSPITAL_COMMUNITY)

## 2023-07-16 ENCOUNTER — Emergency Department (HOSPITAL_COMMUNITY)
Admission: EM | Admit: 2023-07-16 | Discharge: 2023-07-16 | Disposition: A | Discharge status: Home / Self Care | Attending: Emergency Medicine | Admitting: Emergency Medicine

## 2023-07-16 ENCOUNTER — Encounter (HOSPITAL_COMMUNITY): Payer: Self-pay | Admitting: *Deleted

## 2023-07-16 ENCOUNTER — Other Ambulatory Visit: Payer: Self-pay

## 2023-07-16 DIAGNOSIS — K220 Achalasia of cardia: Secondary | ICD-10-CM | POA: Insufficient documentation

## 2023-07-16 DIAGNOSIS — R112 Nausea with vomiting, unspecified: Secondary | ICD-10-CM | POA: Diagnosis present

## 2023-07-16 LAB — COMPREHENSIVE METABOLIC PANEL WITH GFR
ALT: 16 U/L (ref 0–44)
AST: 27 U/L (ref 15–41)
Albumin: 3.8 g/dL (ref 3.5–5.0)
Alkaline Phosphatase: 54 U/L (ref 38–126)
Anion gap: 8 (ref 5–15)
BUN: 20 mg/dL (ref 8–23)
CO2: 26 mmol/L (ref 22–32)
Calcium: 9.6 mg/dL (ref 8.9–10.3)
Chloride: 106 mmol/L (ref 98–111)
Creatinine, Ser: 0.77 mg/dL (ref 0.61–1.24)
GFR, Estimated: >60 mL/min (ref >60)
Glucose, Bld: 155 mg/dL — ABNORMAL HIGH (ref 70–99)
Potassium: 4.1 mmol/L (ref 3.5–5.1)
Sodium: 140 mmol/L (ref 135–145)
Total Bilirubin: 0.5 mg/dL (ref 0.0–1.2)
Total Protein: 7.2 g/dL (ref 6.5–8.1)

## 2023-07-16 LAB — URINALYSIS, ROUTINE W REFLEX MICROSCOPIC
Bilirubin Urine: NEGATIVE
Glucose, UA: NEGATIVE mg/dL
Hgb urine dipstick: NEGATIVE
Ketones, ur: NEGATIVE mg/dL
Leukocytes,Ua: NEGATIVE
Nitrite: NEGATIVE
Protein, ur: 30 mg/dL — AB
Specific Gravity, Urine: 1.018 (ref 1.005–1.030)
pH: 7 (ref 5.0–8.0)

## 2023-07-16 LAB — CBC WITH DIFFERENTIAL/PLATELET
Abs Immature Granulocytes: 0.02 10*3/uL (ref 0.00–0.07)
Basophils Absolute: 0.1 10*3/uL (ref 0.0–0.1)
Basophils Relative: 1 %
Eosinophils Absolute: 0.2 10*3/uL (ref 0.0–0.5)
Eosinophils Relative: 2 %
HCT: 37.7 % — ABNORMAL LOW (ref 39.0–52.0)
Hemoglobin: 12.7 g/dL — ABNORMAL LOW (ref 13.0–17.0)
Immature Granulocytes: 0 %
Lymphocytes Relative: 22 %
Lymphs Abs: 1.8 10*3/uL (ref 0.7–4.0)
MCH: 30.6 pg (ref 26.0–34.0)
MCHC: 33.7 g/dL (ref 30.0–36.0)
MCV: 90.8 fL (ref 80.0–100.0)
Monocytes Absolute: 0.4 10*3/uL (ref 0.1–1.0)
Monocytes Relative: 5 %
Neutro Abs: 5.5 10*3/uL (ref 1.7–7.7)
Neutrophils Relative %: 70 %
Platelets: 418 10*3/uL — ABNORMAL HIGH (ref 150–400)
RBC: 4.15 MIL/uL — ABNORMAL LOW (ref 4.22–5.81)
RDW: 13.9 % (ref 11.5–15.5)
WBC: 8 10*3/uL (ref 4.0–10.5)
nRBC: 0 % (ref 0.0–0.2)

## 2023-07-16 LAB — MAGNESIUM: Magnesium: 2.2 mg/dL (ref 1.7–2.4)

## 2023-07-16 MED ORDER — SODIUM CHLORIDE 0.9 % IV BOLUS
500.0000 mL | Freq: Once | INTRAVENOUS | Status: AC
  Administered 2023-07-16: 500 mL via INTRAVENOUS

## 2023-07-16 MED ORDER — KETOROLAC TROMETHAMINE 30 MG/ML IJ SOLN
30.0000 mg | Freq: Once | INTRAMUSCULAR | Status: AC
  Administered 2023-07-16: 30 mg via INTRAVENOUS
  Filled 2023-07-16: qty 1

## 2023-07-16 NOTE — ED Triage Notes (Signed)
 Pt with emesis, not able to keep anything down.

## 2023-07-16 NOTE — Discharge Instructions (Signed)
 Your testing shows no signs of dehydration,  Please continue to take small amounts of fluid at a time - both nutrional shakes and water  or gatorade Call [facility] to see if they can move your appointment up  ER for worsening symptoms.

## 2023-07-16 NOTE — ED Notes (Signed)
 Patient retrieved belongings from another staff member. Attempted to drink a Monster energy drink, but spit it back into the emesis basin. Patient instructed again to not drink or eat anything. Patient allowed to keep bag at bedside once all drinks and food were removed from it

## 2023-07-16 NOTE — ED Notes (Signed)
 Portable xray at bedside.

## 2023-07-16 NOTE — ED Provider Notes (Signed)
 [place] EMERGENCY DEPARTMENT AT [facility] Provider Note   CSN: [id number] Arrival date & time: 07/16/23  1853     History  Chief Complaint  Patient presents with   Emesis    Mike Moran is a 68 y.o. male.   Emesis  This patient is a 68 year old male who has a history of chronic anxiety and depression as well as some acid reflux, unfortunately he has developed achalasia and esophageal dysmotility and has had a recent admission to the hospital in the last 2 months where he had a broad workup including an EGD confirming this.  He had a esophageal dilation while he was here and has been referred to [facility] to the gastroenterology service where he is scheduled to have a follow-up within 1 week.  He denies chest pain or shortness of breath but does endorse having a feeling of some lightheadedness occasionally, he is making lots of urine according to his report but having very few bowel movements, he states that he has significant difficulty eating any food but is able to tolerate small amounts of food supplement dietary shakes occasionally vomiting but often keeping it down.  He is concerned that he is not getting enough calories and has had chronic weight loss over the last 5 years secondary to his underlying condition.  He denies fevers chills or diarrhea, denies swelling rashes or changes in vision    Home Medications Prior to Admission medications   Medication Sig Start Date End Date Taking? Authorizing Provider  feeding supplement (ENSURE ENLIVE / ENSURE PLUS) LIQD Take 237 mLs by mouth 2 (two) times daily between meals. 05/22/23   Arrien, Mauricio Daniel, MD  HYDROcodone -acetaminophen  (NORCO/VICODIN) 5-325 MG tablet Take 1 tablet by mouth every 8 (eight) hours as needed for severe pain (pain score 7-10). Patient not taking: Reported on 06/06/2023 05/22/23   Arrien, Mauricio Daniel, MD  Melatonin 5 MG/15ML LIQD Take 15 mLs (5 mg total) by mouth at bedtime. Take 1 hour  prior to bed. 06/06/23   Eustacio Highman, NP  Multiple Vitamins-Minerals (MULTIVITAMIN WITH MINERALS) tablet Take 1 tablet by mouth daily.    [provider]  pantoprazole  (PROTONIX ) 40 MG tablet Take 1 tablet (40 mg total) by mouth daily. 05/23/23   Arrien, Curlee Doss, MD  sucralfate  (CARAFATE ) 1 GM/10ML suspension Take 10 mLs (1 g total) by mouth 4 (four) times daily. 06/06/23   Eustacio Highman, NP      Allergies    Codeine and Lexapro [escitalopram oxalate]    Review of Systems   Review of Systems  Gastrointestinal:  Positive for vomiting.  All other systems reviewed and are negative.   Physical Exam Updated Vital Signs BP 115/77 (BP Location: Left Arm)   Pulse 86   Temp 98.3 F (36.8 C) (Oral)   Resp 18   Ht 1.829 m (6')   Wt 55.8 kg   SpO2 98%   BMI 16.68 kg/m  Physical Exam Vitals and nursing note reviewed.  Constitutional:      General: He is not in acute distress.    Appearance: He is well-developed.  HENT:     Head: Normocephalic and atraumatic.     Mouth/Throat:     Pharynx: No oropharyngeal exudate.  Eyes:     General: No scleral icterus.       Right eye: No discharge.        Left eye: No discharge.     Conjunctiva/sclera: Conjunctivae normal.  Pupils: Pupils are equal, round, and reactive to light.  Neck:     Thyroid: No thyromegaly.     Vascular: No JVD.  Cardiovascular:     Rate and Rhythm: Normal rate and regular rhythm.     Heart sounds: Normal heart sounds. No murmur heard.    No friction rub. No gallop.  Pulmonary:     Effort: Pulmonary effort is normal. No respiratory distress.     Breath sounds: Normal breath sounds. No wheezing or rales.  Abdominal:     General: Bowel sounds are normal. There is no distension.     Palpations: Abdomen is soft. There is no mass.     Tenderness: There is no abdominal tenderness.  Musculoskeletal:        General: No tenderness. Normal range of motion.     Cervical back: Normal range of  motion and neck supple.     Right lower leg: No edema.     Left lower leg: No edema.  Lymphadenopathy:     Cervical: No cervical adenopathy.  Skin:    General: Skin is warm and dry.     Findings: No erythema or rash.  Neurological:     Mental Status: He is alert.     Coordination: Coordination normal.  Psychiatric:        Behavior: Behavior normal.     ED Results / Procedures / Treatments   Labs (all labs ordered are listed, but only abnormal results are displayed) Labs Reviewed  COMPREHENSIVE METABOLIC PANEL WITH GFR - Abnormal; Notable for the following components:      Result Value   Glucose, Bld 155 (*)    All other components within normal limits  URINALYSIS, ROUTINE W REFLEX MICROSCOPIC - Abnormal; Notable for the following components:   APPearance CLOUDY (*)    Protein, ur 30 (*)    Bacteria, UA RARE (*)    All other components within normal limits  CBC WITH DIFFERENTIAL/PLATELET - Abnormal; Notable for the following components:   RBC 4.15 (*)    Hemoglobin 12.7 (*)    HCT 37.7 (*)    Platelets 418 (*)    All other components within normal limits  MAGNESIUM     EKG None  Radiology DG Chest Port 1 View Result Date: 07/16/2023 CLINICAL DATA:  Chest pain, vomiting EXAM: PORTABLE CHEST 1 VIEW COMPARISON:  01/04/2021 FINDINGS: Heart and mediastinal contours are within normal limits. No focal opacities or effusions. No acute bony abnormality. Aortic atherosclerosis. IMPRESSION: No active disease. Electronically Signed   By: Janeece Mechanic M.D.   On: 07/16/2023 22:12    Procedures Procedures    Medications Ordered in ED Medications  sodium chloride  0.9 % bolus 500 mL (0 mLs Intravenous Stopped 07/16/23 2046)  ketorolac  (TORADOL ) 30 MG/ML injection 30 mg (30 mg Intravenous Given 07/16/23 2212)    ED Course/ Medical Decision Making/ A&P                                 Medical Decision Making Amount and/or Complexity of Data Reviewed Labs: ordered. Radiology:  ordered.  Risk Prescription drug management.    This patient presents to the ED for concern of difficulty swallowing chronic dysphagia and nausea and vomiting, this involves an extensive number of treatment options, and is a complaint that carries with it a high risk of complications and morbidity.  The differential diagnosis includes worsening achalasia, the patient does smoke  marijuana 3 times a week, would consider that this is in part related to his esophageal dysmotility though it is most likely the achalasia and chronic dysfunction   Co morbidities / Chronic conditions that complicate the patient evaluation  Known achalasia, marijuana use   Additional history obtained:  Additional history obtained from EMR External records from outside source obtained and reviewed including medical record Endoscopy from 2 months ago   Lab Tests:  I Ordered, and personally interpreted labs.  The pertinent results include: No renal dysfunction, no leukocytosis, electrolytes unremarkable, magnesium  unremarkable, urinalysis without ketones or infection   Imaging Studies ordered:  I ordered imaging studies including chest x-ray I independently visualized and interpreted imaging which showed no acute findings I agree with the radiologist interpretation   Cardiac Monitoring: / EKG:  The patient was maintained on a cardiac monitor.  I personally viewed and interpreted the cardiac monitored which showed an underlying rhythm of: Sinus rhythm rate of 86   Problem List / ED Course / Critical interventions / Medication management  Patient appears to be chronically ill, chronic weight loss related to her chronic condition none of which needs an acute intervention.  He is clearly keeping down caloric intake and fluids based on lab work and vital signs, encouraged to follow-up closely in the outpatient setting  I have reviewed the patients home medicines and have made adjustments as  needed   Consultations Obtained:  Outpatient follow-up at [facility]  Social Determinants of Health:  Chronic achalasia   Test / Admission - Considered:  Outpatient follow-up  The patient is not entirely satisfied with his visit which is understandable given his chronic condition which is not easily treatable and certainly not easily curable.  Unfortunately there is nothing that needs to be done this evening, he does need to follow-up closely outpatient and I encouraged him to continue to take small amounts of fluid, he understands and eventually agreed to the plan         Final Clinical Impression(s) / ED Diagnoses Final diagnoses:  Achalasia    Rx / DC Orders ED Discharge Orders     None         Early Glisson, MD 07/16/23 2226

## 2023-07-16 NOTE — ED Notes (Signed)
 Patient attempting to drink from home Glucerna but spitting it back out. Patient instructed to not attempt to drink anything else at this time. Removed patient belongings with food/drink from patient's bed

## 2023-07-19 DIAGNOSIS — R1310 Dysphagia, unspecified: Secondary | ICD-10-CM | POA: Insufficient documentation

## 2023-08-05 ENCOUNTER — Encounter: Payer: Self-pay | Admitting: Gastroenterology

## 2023-08-05 ENCOUNTER — Other Ambulatory Visit: Payer: Self-pay | Admitting: *Deleted

## 2023-08-05 ENCOUNTER — Telehealth: Payer: Self-pay | Admitting: Gastroenterology

## 2023-08-05 ENCOUNTER — Encounter: Payer: Self-pay | Admitting: *Deleted

## 2023-08-05 DIAGNOSIS — K220 Achalasia of cardia: Secondary | ICD-10-CM

## 2023-08-05 DIAGNOSIS — R1310 Dysphagia, unspecified: Secondary | ICD-10-CM

## 2023-08-05 NOTE — Telephone Encounter (Signed)
 Message sent via mychart regarding esophagram

## 2023-08-05 NOTE — Addendum Note (Signed)
 Addended by: GAYLENE MADELIN CROME on: 08/05/2023 11:21 AM   Modules accepted: Orders

## 2023-08-05 NOTE — Telephone Encounter (Signed)
 Reviewed recent esophageal manometry from [facility].  Mindy/Tammy - Please send urgent referral to [facility] for consult for treatment of type II achalasia and consideration for EGD with FLIP.  In the mean time lets set patient up for timed barium esophagram with radiology and speech.   Dr. Cinderella - please let me know if you feel as though it is feasible to do EGD with botox injection into LES for temporary relief of dysphagia or if this would hinder potential POEM if he is candidate at [facility].  Charmaine Melia, MSN, APRN, FNP-BC, AGACNP-BC [facility] at [facility]

## 2023-08-05 NOTE — Telephone Encounter (Signed)
 Urgent referral sent to [facility], referral sent to speech. Esophagram scheduled for 08/15/23, arrive at 8:45 am to check in, NPO 3 hours prior

## 2023-08-05 NOTE — Telephone Encounter (Signed)
Attempted to contact pt, unable to leave message due to mailbox being full.

## 2023-08-07 ENCOUNTER — Encounter (HOSPITAL_COMMUNITY): Payer: Self-pay

## 2023-08-07 ENCOUNTER — Emergency Department (HOSPITAL_COMMUNITY)
Admission: EM | Admit: 2023-08-07 | Discharge: 2023-08-08 | Disposition: A | Discharge status: Home / Self Care | Attending: Emergency Medicine | Admitting: Emergency Medicine

## 2023-08-07 ENCOUNTER — Other Ambulatory Visit: Payer: Self-pay

## 2023-08-07 DIAGNOSIS — S060X1A Concussion with loss of consciousness of 30 minutes or less, initial encounter: Secondary | ICD-10-CM | POA: Diagnosis not present

## 2023-08-07 DIAGNOSIS — W228XXA Striking against or struck by other objects, initial encounter: Secondary | ICD-10-CM | POA: Diagnosis not present

## 2023-08-07 DIAGNOSIS — S0101XA Laceration without foreign body of scalp, initial encounter: Secondary | ICD-10-CM | POA: Diagnosis not present

## 2023-08-07 DIAGNOSIS — Z8616 Personal history of COVID-19: Secondary | ICD-10-CM | POA: Insufficient documentation

## 2023-08-07 DIAGNOSIS — S0990XA Unspecified injury of head, initial encounter: Secondary | ICD-10-CM | POA: Diagnosis present

## 2023-08-07 DIAGNOSIS — R55 Syncope and collapse: Secondary | ICD-10-CM | POA: Insufficient documentation

## 2023-08-07 HISTORY — DX: Acute appendicitis with perforation, localized peritonitis, and gangrene, without abscess: K35.32

## 2023-08-07 HISTORY — DX: Acute appendicitis with localized peritonitis, without perforation or gangrene: K35.30

## 2023-08-07 HISTORY — DX: Major depressive disorder, recurrent severe without psychotic features: F33.2

## 2023-08-07 HISTORY — DX: Generalized anxiety disorder: F41.1

## 2023-08-07 HISTORY — DX: Unilateral inguinal hernia, without obstruction or gangrene, not specified as recurrent: K40.90

## 2023-08-07 HISTORY — DX: Achalasia of cardia: K22.0

## 2023-08-07 NOTE — ED Triage Notes (Addendum)
 Pt states he was eating when food got stuck in his throat, pt reports he then passed out hitting the back of his head.  States he lost consciousness for about 20 seconds. Laceration to back of head, bleeding controlled at this time.   Hx af Achalasia

## 2023-08-08 ENCOUNTER — Emergency Department (HOSPITAL_COMMUNITY)

## 2023-08-08 ENCOUNTER — Encounter (HOSPITAL_COMMUNITY): Payer: Self-pay

## 2023-08-08 LAB — CBC WITH DIFFERENTIAL/PLATELET
Abs Immature Granulocytes: 0.06 10*3/uL (ref 0.00–0.07)
Basophils Absolute: 0.1 10*3/uL (ref 0.0–0.1)
Basophils Relative: 1 %
Eosinophils Absolute: 0.3 10*3/uL (ref 0.0–0.5)
Eosinophils Relative: 3 %
HCT: 33.5 % — ABNORMAL LOW (ref 39.0–52.0)
Hemoglobin: 11.2 g/dL — ABNORMAL LOW (ref 13.0–17.0)
Immature Granulocytes: 1 %
Lymphocytes Relative: 21 %
Lymphs Abs: 2.5 10*3/uL (ref 0.7–4.0)
MCH: 31 pg (ref 26.0–34.0)
MCHC: 33.4 g/dL (ref 30.0–36.0)
MCV: 92.8 fL (ref 80.0–100.0)
Monocytes Absolute: 0.8 10*3/uL (ref 0.1–1.0)
Monocytes Relative: 7 %
Neutro Abs: 8.2 10*3/uL — ABNORMAL HIGH (ref 1.7–7.7)
Neutrophils Relative %: 67 %
Platelets: 362 10*3/uL (ref 150–400)
RBC: 3.61 MIL/uL — ABNORMAL LOW (ref 4.22–5.81)
RDW: 14.5 % (ref 11.5–15.5)
WBC: 12 10*3/uL — ABNORMAL HIGH (ref 4.0–10.5)
nRBC: 0 % (ref 0.0–0.2)

## 2023-08-08 LAB — BASIC METABOLIC PANEL WITH GFR
Anion gap: 10 (ref 5–15)
BUN: 16 mg/dL (ref 8–23)
CO2: 27 mmol/L (ref 22–32)
Calcium: 8.9 mg/dL (ref 8.9–10.3)
Chloride: 102 mmol/L (ref 98–111)
Creatinine, Ser: 0.75 mg/dL (ref 0.61–1.24)
GFR, Estimated: >60 mL/min (ref >60)
Glucose, Bld: 89 mg/dL (ref 70–99)
Potassium: 3.7 mmol/L (ref 3.5–5.1)
Sodium: 139 mmol/L (ref 135–145)

## 2023-08-08 MED ORDER — LIDOCAINE-EPINEPHRINE-TETRACAINE (LET) TOPICAL GEL
3.0000 mL | Freq: Once | TOPICAL | Status: AC
  Administered 2023-08-08: 3 mL via TOPICAL
  Filled 2023-08-08: qty 3

## 2023-08-08 MED ORDER — KETOROLAC TROMETHAMINE 15 MG/ML IJ SOLN
15.0000 mg | Freq: Once | INTRAMUSCULAR | Status: AC
  Administered 2023-08-08: 15 mg via INTRAVENOUS
  Filled 2023-08-08: qty 1

## 2023-08-08 MED ORDER — SODIUM CHLORIDE 0.9 % IV BOLUS
1000.0000 mL | Freq: Once | INTRAVENOUS | Status: AC
  Administered 2023-08-08: 1000 mL via INTRAVENOUS

## 2023-08-08 NOTE — Telephone Encounter (Signed)
 Left detailed message regarding esophagram appt date and time and also that had sent a mychart message with appt. Any questions to call back.

## 2023-08-08 NOTE — ED Provider Notes (Signed)
 [place] EMERGENCY DEPARTMENT AT [facility] Provider Note   CSN: [id number] Arrival date & time: 08/07/23  2313     Patient presents with: Loss of Consciousness   Mike Moran is a 68 y.o. male.   The history is provided by the patient.  Patient with history of achalasia presents for syncopal episode.  Patient reports he was at [employer] when he was eating a brownie which he felt got stuck in his throat, he started to vomit then he had a brief syncopal episode.  He reports he struck the back of his head has a laceration.  He had brief loss of consciousness.  He is not on anticoagulation.  He has had the symptoms previously with his achalasia where it triggers him to have a syncopal episode.  No chest pain or shortness of breath.  No abdominal pain.  He reports he did sustain a laceration to the scalp as well as a small abrasion and pain to his left elbow.  Patient reports he is otherwise at his baseline. 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   Chronic anemia 05/20/2023   Depression    Dysphagia 07/19/2023   Elevated brain natriuretic peptide (BNP) level 03/25/2020   Esophageal stricture 05/19/2023   GAD (generalized anxiety disorder)    Gastritis and gastroduodenitis 05/19/2023   Hypokalemia 05/20/2023   Leukocytosis 03/25/2020   Normocytic anemia 03/25/2020   Odynophagia 07/19/2023   Perforated appendicitis 01/04/2021   Pleural effusion on right 03/25/2020   Pneumonia due to COVID-19 virus 03/25/2020   Pulmonary nodule 03/25/2020   Respiratory failure with hypoxia (HCC) 03/25/2020    Right inguinal hernia    Sepsis (HCC) 03/25/2020   Severe episode of recurrent major depressive disorder, without psychotic features (HCC)    Thrombocytosis 03/25/2020    Prior to Admission medications   Medication Sig Start Date End Date Taking? Authorizing Provider  feeding supplement (ENSURE ENLIVE / ENSURE PLUS) LIQD Take 237 mLs by mouth 2 (two) times daily between meals. 05/22/23   Arrien, Mauricio Daniel, MD  HYDROcodone -acetaminophen  (NORCO/VICODIN) 5-325 MG tablet Take 1 tablet by mouth every 8 (eight) hours as needed for severe pain (pain score 7-10). Patient not taking: Reported on 06/06/2023 05/22/23   Arrien, Mauricio Daniel, MD  Melatonin 5 MG/15ML LIQD Take 15 mLs (5 mg total) by mouth at bedtime. Take 1 hour prior to bed. 06/06/23   Kennedy Charmaine CROME, NP  Multiple Vitamins-Minerals (MULTIVITAMIN WITH MINERALS) tablet Take 1 tablet by mouth daily.    [provider]  pantoprazole  (PROTONIX ) 40 MG tablet Take 1 tablet (40 mg total) by mouth daily. 05/23/23   Arrien, Elidia Sieving, MD  sucralfate  (CARAFATE ) 1 GM/10ML suspension Take 10 mLs (1 g total) by mouth 4 (four) times daily. 06/06/23   Kennedy Charmaine CROME, NP    Allergies: Codeine and Lexapro [escitalopram oxalate]    Review of Systems  Respiratory:  Negative for shortness of breath.   Cardiovascular:  Negative for chest pain.  Gastrointestinal:  Negative for abdominal pain.  Skin:  Positive for wound.  Neurological:  Positive for syncope and headaches.    Updated Vital Signs BP 98/71   Pulse 66   Temp 98.3 F (36.8 C) (Oral)   Resp 15   SpO2 99%   Physical Exam CONSTITUTIONAL: Thin appearing, no acute distress HEAD: Small laceration to posterior scalp, no signs of trauma EYES: EOMI/PERRL ENMT: Mucous membranes moist NECK: supple no meningeal signs SPINE/BACK:entire spine nontender No bruising/crepitance/stepoffs noted to spine CV: S1/S2 noted, no murmurs/rubs/gallops noted LUNGS: Lungs are clear to  auscultation bilaterally, no apparent distress ABDOMEN: soft, nontender NEURO: Pt is awake/alert/appropriate, moves all extremitiesx4.  No facial droop.   EXTREMITIES: pulses normal/equal, full ROM Small abrasion noted to the left elbow, mild tenderness, no deformities or swelling All other extremities/joints palpated/ranged and nontender SKIN: warm, color normal PSYCH: no abnormalities of mood noted, alert and oriented to situation  (all labs ordered are listed, but only abnormal results are displayed) Labs Reviewed  CBC WITH DIFFERENTIAL/PLATELET - Abnormal; Notable for the following components:      Result Value   WBC 12.0 (*)    RBC 3.61 (*)    Hemoglobin 11.2 (*)    HCT 33.5 (*)    Neutro Abs 8.2 (*)    All other components within normal limits  BASIC METABOLIC PANEL WITH GFR    EKG: EKG Interpretation Date/Time:  Monday August 08 2023 00:03:38 EDT Ventricular Rate:  70 PR Interval:  239 QRS Duration:  89 QT Interval:  392 QTC Calculation: 423 R Axis:   262  Text Interpretation: Sinus rhythm Prolonged PR interval Right superior axis Low voltage, precordial leads Anteroseptal infarct, age indeterminate Interpretation limited secondary to artifact Confirmed by Midge Golas ([id number]) on 08/08/2023 12:11:48 AM  Radiology: [facility]A Elbow Complete Left Result Date: 08/08/2023 CLINICAL DATA:  Syncope with subsequent fall. EXAM: LEFT ELBOW - COMPLETE 3+ VIEW COMPARISON:  None Available. FINDINGS: There is no evidence of fracture, dislocation, or joint effusion. There is no evidence of arthropathy or other focal bone abnormality. Mild soft tissue swelling is seen along the posterior aspect of the left elbow. IMPRESSION: Mild posterior soft tissue swelling without evidence of an acute osseous abnormality. Electronically Signed   By: Suzen Dials M.D.   On: 08/08/2023 00:48   CT Head Wo Contrast Result Date: 08/08/2023 CLINICAL DATA:  Head trauma, moderate-severe EXAM: CT HEAD WITHOUT  CONTRAST TECHNIQUE: Contiguous axial images were obtained from the base of the skull through the vertex without intravenous contrast. RADIATION DOSE REDUCTION: This exam was performed according to the departmental dose-optimization program which includes automated exposure control, adjustment of the mA and/or kV according to patient size and/or use of iterative reconstruction technique. COMPARISON:  CT head May 19, 2023. FINDINGS: Brain: No evidence of acute infarction, hemorrhage, hydrocephalus, extra-axial collection or mass lesion/mass effect. Vascular: No hyperdense vessel. Skull: No acute fracture. Sinuses/Orbits: Clear sinuses.  No acute orbital findings. Other: No mastoid effusions. IMPRESSION: No evidence of acute intracranial abnormality. Electronically Signed   By: Gilmore GORMAN Molt M.D.   On: 08/08/2023 00:30     .Laceration Repair  Date/Time: 08/08/2023 1:15 AM  Performed by: Midge Golas, MD Authorized by: Midge Golas, MD   Consent:    Consent obtained:  Verbal   Consent given by:  Patient Universal protocol:    Patient identity confirmed:  Provided demographic data Anesthesia:    Anesthesia method:  Topical application   Topical anesthetic:  LET Laceration details:    Location:  Scalp   Scalp location:  Occipital  Length (cm):  1 Exploration:    Wound exploration: wound explored through full range of motion and entire depth of wound visualized     Contaminated: no   Skin repair:    Repair method:  Staples   Number of staples:  1 Approximation:    Approximation:  Close Repair type:    Repair type:  Simple Post-procedure details:    Procedure completion:  Tolerated well, no immediate complications    Medications Ordered in the ED  sodium chloride  0.9 % bolus 1,000 mL (1,000 mLs Intravenous New Bag/Given 08/08/23 0044)  lidocaine -EPINEPHrine-tetracaine (LET) topical gel (3 mLs Topical Given 08/08/23 0044)  ketorolac  (TORADOL ) 15 MG/ML injection 15 mg (15 mg  Intravenous Given 08/08/23 0048)  sodium chloride  0.9 % bolus 1,000 mL (1,000 mLs Intravenous New Bag/Given 08/08/23 0059)    Clinical Course as of 08/08/23 0122  Mon Aug 08, 2023  0054 WBC(!): 12.0 Mild leukocytosis [DW]  0101 Patient reports syncopal episode after he was choking due to his achalasia.  Patient has a long history of this and is having workup as an outpatient.  He also reports working out in the excessive heat which is likely contributed to dehydration.  His blood pressure is low but he is receiving IV fluids. [DW]  0121 Patient feeling improved.  Overall imaging is unremarkable.  His vital signs are improving. He is taking oral fluids.  Patient reports a long history of these issues and he is now requesting discharge home.  Wound was repaired without difficulty.  He will be discharged home  [DW]    Clinical Course User Index [DW] Midge Golas, MD           Glasgow Coma Scale Score: 15      NEXUS Criteria Score: 0                Medical Decision Making Amount and/or Complexity of Data Reviewed Labs: ordered. Decision-making details documented in ED Course. Radiology: ordered. ECG/medicine tests: ordered.  Risk Prescription drug management.   This patient presents to the ED for concern of syncope, this involves an extensive number of treatment options, and is a complaint that carries with it a high risk of complications and morbidity.  The differential diagnosis includes but is not limited to orthostatic hypotension, seizure, cardiac arrhythmia, Vasovagal syncope  Comorbidities that complicate the patient evaluation: Patient's presentation is complicated by their history of achalasia  Social Determinants of Health: Patient's tobacco use  increases the complexity of managing their presentation  Additional history obtained: Additional history obtained from family Records reviewed outpatient records reviewed  Lab Tests: I Ordered, and personally interpreted  labs.  The pertinent results include: Mild leukocytosis, anemia  Imaging Studies ordered: I ordered imaging studies including CT scan head and X-ray left elbow  I independently visualized and interpreted imaging which showed no acute findings I agree with the radiologist interpretation  Cardiac Monitoring: The patient was maintained on a cardiac monitor.  I personally viewed and interpreted the cardiac monitor which showed an underlying rhythm of:  sinus rhythm  Medicines ordered and prescription drug management: I ordered medication including Toradol  for headache Reevaluation of the patient after these medicines showed that the patient    improved  Reevaluation: After the interventions noted above, I reevaluated the patient and found that they have :improved  Complexity of problems addressed: Patient's presentation is most consistent with  acute presentation with potential threat to life or bodily function  Disposition: After consideration of the  diagnostic results and the patient's response to treatment,  I feel that the patent would benefit from discharge  .        Final diagnoses:  Syncope and collapse  Laceration of scalp, initial encounter  Concussion with loss of consciousness of 30 minutes or less, initial encounter    ED Discharge Orders     None          Midge Golas, MD 08/08/23 [phone]

## 2023-08-08 NOTE — ED Notes (Signed)
 ED Provider at bedside.

## 2023-08-08 NOTE — ED Notes (Signed)
 Pt/family received d/c paperwork at this time. After going over the paperwork any questions, comments, or concerns were answered to the best of this nurse's knowledge. The pt/family verbally acknowledged the teachings/instructions.

## 2023-08-08 NOTE — ED Notes (Signed)
 PO challenge successfully completed at this time with water . No issues.

## 2023-08-15 ENCOUNTER — Other Ambulatory Visit (HOSPITAL_COMMUNITY)

## 2023-08-17 ENCOUNTER — Telehealth: Payer: Self-pay

## 2023-08-17 NOTE — Telephone Encounter (Signed)
 Pt's wife called wanting to know the results to the esophageal manometry that the pt recently had done at [facility]. Report is scanned under media for review.

## 2023-08-17 NOTE — Telephone Encounter (Signed)
 LMOVM for spouse with appt details.

## 2023-08-22 ENCOUNTER — Other Ambulatory Visit: Payer: Self-pay | Admitting: Gastroenterology

## 2023-08-22 ENCOUNTER — Ambulatory Visit (HOSPITAL_COMMUNITY)
Admission: RE | Admit: 2023-08-22 | Discharge: 2023-08-22 | Disposition: A | Discharge status: Home / Self Care | Source: Ambulatory Visit | Attending: Gastroenterology | Admitting: Gastroenterology

## 2023-08-22 ENCOUNTER — Ambulatory Visit: Payer: Self-pay | Admitting: Gastroenterology

## 2023-08-22 DIAGNOSIS — K220 Achalasia of cardia: Secondary | ICD-10-CM

## 2023-08-22 DIAGNOSIS — R1310 Dysphagia, unspecified: Secondary | ICD-10-CM

## 2023-08-26 ENCOUNTER — Encounter (HOSPITAL_COMMUNITY): Payer: Self-pay

## 2023-08-26 ENCOUNTER — Other Ambulatory Visit: Payer: Self-pay

## 2023-08-26 ENCOUNTER — Emergency Department (HOSPITAL_COMMUNITY)

## 2023-08-26 ENCOUNTER — Emergency Department (HOSPITAL_COMMUNITY)
Admission: EM | Admit: 2023-08-26 | Discharge: 2023-08-26 | Disposition: A | Discharge status: Home / Self Care | Attending: Emergency Medicine | Admitting: Emergency Medicine

## 2023-08-26 DIAGNOSIS — M79672 Pain in left foot: Secondary | ICD-10-CM | POA: Insufficient documentation

## 2023-08-26 DIAGNOSIS — W1840XA Slipping, tripping and stumbling without falling, unspecified, initial encounter: Secondary | ICD-10-CM | POA: Insufficient documentation

## 2023-08-26 LAB — CBC WITH DIFFERENTIAL/PLATELET
Abs Immature Granulocytes: 0.05 K/uL (ref 0.00–0.07)
Basophils Absolute: 0.1 K/uL (ref 0.0–0.1)
Basophils Relative: 1 %
Eosinophils Absolute: 0.4 K/uL (ref 0.0–0.5)
Eosinophils Relative: 5 %
HCT: 36.4 % — ABNORMAL LOW (ref 39.0–52.0)
Hemoglobin: 12 g/dL — ABNORMAL LOW (ref 13.0–17.0)
Immature Granulocytes: 1 %
Lymphocytes Relative: 21 %
Lymphs Abs: 1.9 K/uL (ref 0.7–4.0)
MCH: 30.6 pg (ref 26.0–34.0)
MCHC: 33 g/dL (ref 30.0–36.0)
MCV: 92.9 fL (ref 80.0–100.0)
Monocytes Absolute: 0.6 K/uL (ref 0.1–1.0)
Monocytes Relative: 7 %
Neutro Abs: 5.9 K/uL (ref 1.7–7.7)
Neutrophils Relative %: 65 %
Platelets: 380 K/uL (ref 150–400)
RBC: 3.92 MIL/uL — ABNORMAL LOW (ref 4.22–5.81)
RDW: 13.8 % (ref 11.5–15.5)
WBC: 8.9 K/uL (ref 4.0–10.5)
nRBC: 0 % (ref 0.0–0.2)

## 2023-08-26 LAB — BASIC METABOLIC PANEL WITH GFR
Anion gap: 13 (ref 5–15)
BUN: 17 mg/dL (ref 8–23)
CO2: 27 mmol/L (ref 22–32)
Calcium: 9.6 mg/dL (ref 8.9–10.3)
Chloride: 100 mmol/L (ref 98–111)
Creatinine, Ser: 0.68 mg/dL (ref 0.61–1.24)
GFR, Estimated: >60 mL/min (ref >60)
Glucose, Bld: 69 mg/dL — ABNORMAL LOW (ref 70–99)
Potassium: 4 mmol/L (ref 3.5–5.1)
Sodium: 140 mmol/L (ref 135–145)

## 2023-08-26 MED ORDER — HYDROCODONE-ACETAMINOPHEN 5-325 MG PO TABS: ORAL_TABLET | ORAL | 0 refills | Status: AC

## 2023-08-26 MED ORDER — KETOROLAC TROMETHAMINE 30 MG/ML IJ SOLN
15.0000 mg | Freq: Once | INTRAMUSCULAR | Status: AC
  Administered 2023-08-26: 15 mg via INTRAVENOUS
  Filled 2023-08-26: qty 1

## 2023-08-26 MED ORDER — CEPHALEXIN 500 MG PO CAPS: 500.0000 mg | ORAL_CAPSULE | Freq: Four times a day (QID) | ORAL | 0 refills | Status: AC

## 2023-08-26 MED ORDER — ACYCLOVIR 800 MG PO TABS
800.0000 mg | ORAL_TABLET | Freq: Once | ORAL | Status: AC
  Administered 2023-08-26: 800 mg via ORAL
  Filled 2023-08-26: qty 1

## 2023-08-26 MED ORDER — SODIUM CHLORIDE 0.9 % IV BOLUS
1000.0000 mL | Freq: Once | INTRAVENOUS | Status: AC
  Administered 2023-08-26: 1000 mL via INTRAVENOUS

## 2023-08-26 MED ORDER — VALACYCLOVIR HCL 500 MG PO TABS: 500.0000 mg | ORAL_TABLET | Freq: Two times a day (BID) | ORAL | 0 refills | Status: AC

## 2023-08-26 MED ORDER — LIDOCAINE HCL URETHRAL/MUCOSAL 2 % EX GEL
1.0000 | Freq: Once | CUTANEOUS | Status: AC
  Administered 2023-08-26: 1 via TOPICAL
  Filled 2023-08-26: qty 10

## 2023-08-26 NOTE — ED Triage Notes (Signed)
 Patient come in POV for complaint of foot pain. Stated he stepped in a hole and scrapped the top of foot on a rock three days ago. Stated this morning the began to have needle like/ burning pain to top of foot.

## 2023-08-26 NOTE — Discharge Instructions (Signed)
 Elevate your foot when possible.  Take the medications as directed.  Follow-up with your primary care provider for recheck or return to the emergency department if you develop any new or worsening symptoms.

## 2023-08-26 NOTE — ED Provider Notes (Signed)
 [facility] Provider Note   CSN: [id number] Arrival date & time: 08/26/23  9049     Patient presents with: Foot Pain   Mike Moran is a 68 y.o. male.    Foot Pain Pertinent negatives include no chest pain and no shortness of breath.       Mike Moran is a 68 y.o. male with past medical history of anemia, anxiety, who presents to the Emergency Department complaining of pain to the dorsal left foot.  Symptoms began 3 days ago when he stepped in a hole and a rock scraped his foot. He has abrasions to top of his foot that may have been contaminated with cow manure as the incident occurred in a cow [place].  Describes a scrape to the dorsal surface of the left foot.  He woke in the early morning hours today with burning throbbing pains to the top of his foot.  Pain seems to improve within bearing weight.  He denies any swelling, red streaking or pain radiating into his ankle or leg.  Last tetanus was in April.  He also denies any fever or chills.  Prior to Admission medications   Medication Sig Start Date End Date Taking? Authorizing Provider  feeding supplement (ENSURE ENLIVE / ENSURE PLUS) LIQD Take 237 mLs by mouth 2 (two) times daily between meals. 05/22/23   Arrien, Mauricio Daniel, MD  HYDROcodone -acetaminophen  (NORCO/VICODIN) 5-325 MG tablet Take 1 tablet by mouth every 8 (eight) hours as needed for severe pain (pain score 7-10). Patient not taking: Reported on 06/06/2023 05/22/23   Arrien, Mauricio Daniel, MD  Melatonin 5 MG/15ML LIQD Take 15 mLs (5 mg total) by mouth at bedtime. Take 1 hour prior to bed. 06/06/23   Kennedy Charmaine CROME, NP  Multiple Vitamins-Minerals (MULTIVITAMIN WITH MINERALS) tablet Take 1 tablet by mouth daily.    [provider]  pantoprazole  (PROTONIX ) 40 MG tablet Take 1 tablet (40 mg total) by mouth daily. 05/23/23   Arrien, Elidia Sieving, MD  sucralfate  (CARAFATE ) 1 GM/10ML suspension Take 10 mLs (1 g  total) by mouth 4 (four) times daily. 06/06/23   Kennedy Charmaine CROME, NP    Allergies: Codeine and Lexapro [escitalopram oxalate]    Review of Systems  Constitutional:  Negative for chills and fever.  Respiratory:  Negative for shortness of breath.   Cardiovascular:  Negative for chest pain.  Gastrointestinal:  Negative for nausea and vomiting.  Musculoskeletal:  Positive for arthralgias (left foot pain). Negative for joint swelling.  Skin:  Positive for wound.       Abrasion top of left foot  Neurological:  Negative for weakness and numbness.    Updated Vital Signs BP (!) 87/68   Pulse 72   Temp 98.3 F (36.8 C) (Oral)   Resp 17   Ht 6' (1.829 m)   Wt 61.2 kg   SpO2 99%   BMI 18.31 kg/m   Physical Exam Vitals and nursing note reviewed.  Constitutional:      General: He is not in acute distress.    Appearance: Normal appearance.  Cardiovascular:     Rate and Rhythm: Normal rate and regular rhythm.     Pulses: Normal pulses.  Pulmonary:     Effort: Pulmonary effort is normal.  Musculoskeletal:        General: Tenderness and signs of injury present. No swelling.     Right lower leg: No edema.     Left lower leg:  No edema.  Skin:    General: Skin is warm.     Capillary Refill: Capillary refill takes less than 2 seconds.  Neurological:     General: No focal deficit present.     Mental Status: He is alert.     Sensory: No sensory deficit.     Motor: No weakness.      Pt gave verbal consent for image to be stored in medical record.      (all labs ordered are listed, but only abnormal results are displayed) Labs Reviewed  CBC WITH DIFFERENTIAL/PLATELET - Abnormal; Notable for the following components:      Result Value   RBC 3.92 (*)    Hemoglobin 12.0 (*)    HCT 36.4 (*)    All other components within normal limits  BASIC METABOLIC PANEL WITH GFR - Abnormal; Notable for the following components:   Glucose, Bld 69 (*)    All other components within normal  limits    EKG: None  Radiology: DG Foot Complete Left Result Date: 08/26/2023 CLINICAL DATA:  Foot pain.  Injury. EXAM: LEFT FOOT - COMPLETE 3+ VIEW COMPARISON:  None Available. FINDINGS: No acute fracture or dislocation. No aggressive osseous lesion. Mild diffuse arthritis of imaged joints. Calcaneal spur noted along the Plantar aponeurosis attachment site. No focal soft tissue swelling. No radiopaque foreign bodies. IMPRESSION: No acute osseous abnormality of the left foot. Electronically Signed   By: Ree Molt M.D.   On: 08/26/2023 11:09     Procedures   Medications Ordered in the ED  lidocaine  (XYLOCAINE ) 2 % jelly 1 Application (has no administration in time range)                                    Medical Decision Making Patient here with abrasion to the dorsal left foot after stepping in a hole that contained a rock.  Incident occurred in a cow pasture, patient concerned wound may be contaminated with cow manure.  Incident occurred 3 days ago, woke with burning stabbing pain to his foot this morning.  Denies any fever chills or swelling.  Td is up-to-date   Several abrasions to the dorsal foot, no clinical signs of infection at this time although may be developing.  Patient nontoxic-appearing.  Extremity is neurovascularly intact.  Possible developing cellulitis, retained foreign body, zoster also considered.  No clinical findings suggestive of abscess or fracture.  Amount and/or Complexity of Data Reviewed Labs: ordered.    Details: No leukocytosis chemistries without derangement Radiology: ordered.    Details: X-ray of the foot without radiographic foreign body or evidence of fracture Discussion of management or test interpretation with external provider(s): Pt was hypotensive on arrival.  He has had low BP's in the past.  Denies any symptoms other than foot pain.  Pressure improved spontaneously,and he was given IVF as well, pt observed, pressures have remained above  100 systolic.  Possible developing cellulitis vs zoster.  Will treat with abx and antiviral.  Doubt FB's.  XR w/o evidence of fx.    Wounds cleaned here and foot bandaged.  He agrees to tx plan and close out pt f/u.  Return precautions also given.   Risk Prescription drug management.        Final diagnoses:  Left foot pain    ED Discharge Orders     None  Herlinda Milling, PA-C 08/29/23 1157    Bernard Drivers, MD 08/29/23 [phone]

## 2023-08-26 NOTE — ED Notes (Signed)
 Foot cleaned and wrapped, sock applied.

## 2023-08-30 NOTE — Progress Notes (Unsigned)
 GI Office Note    Referring Provider: [place], [facility] Primary Care Physician:  [place], [facility] Primary Gastroenterologist: Muhammad Faizan Ahmed, MD  Date:  08/31/2023  ID:  Mike Moran, DOB 12/17/1955, MRN [id number]  Chief Complaint   Chief Complaint  Patient presents with   Follow-up    Follow up EGD. Wants to discuss results. Pt is still having issues for his swallowing food does not go down then it comes back up   History of Present Illness  Mike Moran is a 68 y.o. male with a history of anxiety/depression with prior SI and 2017, appendectomy 2022, perirectal abscess in 2008, and recently diagnosed achalasia via manometry presenting today for follow-up of achalasia to discuss recent imaging results.  EGD Feb 2008: -esophageal ring s/p dilation -normal stomach and duodenum.   Colonoscopy 2008: - two polyps (cecum and transverse) -small internal/external hemorrhoids   Seen  inpatient for reports of dysphagia and odynophagia.  Underwent EGD as noted below.  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   EGD 05/19/23: - Dilation in the entire esophagus.  - Hypertonic lower esophageal sphincter, with mild pluckering s/p Dilation.  - A medium amount of food ( residue) in the stomach.  - A single submucosal papule ( nodule) found in the stomach. likely pancreatic. - Normal duodenal bulb and second portion of the duodenum.  - Biopsies were taken with a cold forceps for evaluation of eosinophilic esophagitis.  - This appears to be motility disorder which is recommended to be assessed with HRM (manometry) as outpatient - Advised to obtain esophogram, achalasia protocol -Dysphagia precautions discussed -Repeat EGD to further assess gastric body   BPE 05/20/23: -Signs of esophageal dysmotility  with limited primary wave and tertiary peristaltic activity.  -Stasis of ingested bolus of contrast in the esophagus. - Corkscrew appearance in the upper esophagus  Last office visit 06/06/23.  Reports sometimes will even within 5 minutes of eating food is coming back up and not able to hold it down.  Some days he is able to tolerate things like candy bars and other soft foods but at times unable to keep things down or tolerate much liquid.  Had been taking Mylanta due to concerns for spasms in his chest that are sharp and painful.  Had been trying to sip on boost to keep protein intake.  At times fish she goes down without a problem.  Discussed ASAP referral to [facility] for manometry and treatment of achalasia.  Advise full liquid diet and to limit solid foods to being soft.  Advise Carafate  3 times daily for throat pain and continue pantoprazole  40 mg once daily.  Advised crushing medications as able.  Planning for colonoscopy after treatment of achalasia.  08/05/2023 reviewed manometry reports from [facility] previously which indicate positive signs of type II achalasia.  They suggested EGD with flip and/or obtaining timed barium esophagram.  ED visit 08/07/2023 for syncope.  08/17/2023 I discussed with the patient and his wife in detail regarding his manometry findings and  next [facility] evaluation.  We ordered timed barium esophagram.  Recent urgent referral to [facility] for appointment to discuss EGD with flip and definitive treatment for achalasia.  7/14 was contacted by radiology that esophagram was performed but study was incomplete given there was difficulty of emptying any liquid  even after several minutes.  There was smooth tapering of the distal third of the esophagus with narrowing at the GE junction.  There was incomplete emptying of even a small amount of barium after several minutes.  Presence of tertiary contractions as well.  Given incomplete emptying, barium tablet was not given.  ED visit 08/26/2023 for  left foot pain.  Had scratches to the top of his feet which was possibly contaminated with cow manure.  EDP suspected cellulitis versus herpes zoster.  Presented with some hypotension.  Was given antibiotics and antiviral.  X-ray without evidence of fracture.  Wound was cleaned and bandaged.  Today:  Sometism boost comes back up.   Wt Readings from Last 5 Encounters:  08/31/23 126 lb 12.8 oz (57.5 kg)  08/26/23 135 lb (61.2 kg)  07/16/23 123 lb 0.3 oz (55.8 kg)  06/07/23 123 lb (55.8 kg)  06/06/23 123 lb 9.6 oz (56.1 kg)    Current Outpatient Medications  Medication Sig Dispense Refill   cephALEXin  (KEFLEX ) 500 MG capsule Take 1 capsule (500 mg total) by mouth 4 (four) times daily. 28 capsule 0   feeding supplement (ENSURE ENLIVE / ENSURE PLUS) LIQD Take 237 mLs by mouth 2 (two) times daily between meals. 237 mL 12   Multiple Vitamins-Minerals (MULTIVITAMIN WITH MINERALS) tablet Take 1 tablet by mouth daily.     pantoprazole  (PROTONIX ) 40 MG tablet Take 1 tablet (40 mg total) by mouth daily. 30 tablet 0   sucralfate  (CARAFATE ) 1 GM/10ML suspension Take 10 mLs (1 g total) by mouth 4 (four) times daily. 473 mL 2   HYDROcodone -acetaminophen  (NORCO/VICODIN) 5-325 MG tablet Take one tab po q 4 hrs prn pain (Patient not taking: Reported on 08/31/2023) 10 tablet 0   Melatonin 5 MG/15ML LIQD Take 15 mLs (5 mg total) by mouth at bedtime. Take 1 hour prior to bed. (Patient not taking: Reported on 08/31/2023) 450 mL 2   valACYclovir  (VALTREX ) 500 MG tablet Take 1 tablet (500 mg total) by mouth 2 (two) times daily. (Patient not taking: Reported on 08/31/2023) 20 tablet 0   No current facility-administered medications for this visit.    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   Chronic  anemia 05/20/2023   Depression    Dysphagia 07/19/2023   Elevated brain natriuretic peptide (BNP) level 03/25/2020   Esophageal stricture 05/19/2023   GAD (generalized anxiety disorder)    Gastritis and gastroduodenitis 05/19/2023   Hypokalemia 05/20/2023   Leukocytosis 03/25/2020   Normocytic anemia 03/25/2020   Odynophagia 07/19/2023   Perforated appendicitis 01/04/2021   Pleural effusion on right 03/25/2020   Pneumonia due to COVID-19 virus 03/25/2020   Pulmonary nodule 03/25/2020   Respiratory failure with hypoxia (HCC) 03/25/2020   Right inguinal hernia    Sepsis (HCC) 03/25/2020   Severe episode of recurrent major depressive disorder, without psychotic features (HCC)    Thrombocytosis 03/25/2020    Past Surgical History:  Procedure Laterality Date   ESOPHAGOGASTRODUODENOSCOPY N/A 05/19/2023   Procedure: EGD (ESOPHAGOGASTRODUODENOSCOPY);  Surgeon: Cinderella Deatrice FALCON, MD;  Location: [facility];  Service: Endoscopy;  Laterality: N/A;   INGUINAL HERNIA REPAIR Right 11/05/2020   Procedure: HERNIA REPAIR INGUINAL ADULT WITH MESH;  Surgeon: Mavis Anes, MD;  Location: [facility];  Service: General;  Laterality: Right;   LAPAROSCOPIC APPENDECTOMY N/A 01/04/2021   Procedure: APPENDECTOMY LAPAROSCOPIC;  Surgeon: Mavis Anes, MD;  Location: [facility];  Service: General;  Laterality: N/A;    No family history on file.  Allergies as of 08/31/2023 - Review Complete 08/31/2023  Allergen Reaction Noted   Codeine Nausea And Vomiting 11/28/2013   Lexapro [escitalopram oxalate] Other (See Comments) 01/09/2014    Social History   Socioeconomic History   Marital status: Married    Spouse name: Not on file   Number of children: Not on file   Years of education: Not on file   Highest education level: Not on file  Occupational History   Not on file  Tobacco Use   Smoking status: Some Days    Types: Cigarettes   Smokeless tobacco: Never  Vaping Use   Vaping status: Every Day   Substance and Sexual Activity   Alcohol use: Not Currently    Comment: pt quit drinking couple years ago- reported on 08/08/23   Drug use: Yes    Types: Marijuana   Sexual activity: Not on file  Other Topics Concern   Not on file  Social History Narrative   Not on file   Social Drivers of Health   Financial Resource Strain: Not on file  Food Insecurity: No Food Insecurity (05/19/2023)   Hunger Vital Sign    Worried About Running Out of Food in the Last Year: Never true    Ran Out of Food in the Last Year: Never true  Transportation Needs: No Transportation Needs (05/19/2023)   PRAPARE - Administrator, Civil Service (Medical): No    Lack of Transportation (Non-Medical): No  Physical Activity: Not on file  Stress: Not on file  Social Connections: Socially Integrated (05/19/2023)   Social Connection and Isolation Panel    Frequency of Communication with Friends and Family: Twice a week    Frequency of Social Gatherings with Friends and Family: Twice a week    Attends Religious Services: 1 to 4 times per year    Active Member of [employer] or Organizations: No    Attends Engineer, structural: 1 to 4 times per year    Marital Status: Married     Review of Systems   Gen: Denies fever, chills, anorexia. Denies fatigue, weakness, weight loss.  CV: Denies chest pain, palpitations, syncope, peripheral edema, and claudication. Resp: Denies dyspnea at rest, cough, wheezing, coughing up blood, and pleurisy. GI: See HPI Derm: Denies rash, itching, dry skin Psych: Denies depression, anxiety, memory loss, confusion. No homicidal or suicidal ideation.  Heme: Denies bruising, bleeding, and enlarged lymph nodes.  Physical Exam   BP 96/67   Pulse 64   Temp 98.5 F (36.9 C)   Ht 6' (1.829 m)   Wt 126 lb 12.8 oz (57.5 kg)   BMI 17.20 kg/m   General:   Alert and oriented. No distress noted. Pleasant and cooperative.  Thin appearing. Head:  Normocephalic and  atraumatic. Eyes:  Conjuctiva clear without scleral icterus. Mouth:  Oral mucosa pink and moist. Poor dentition. No lesions Msk:  Symmetrical without gross deformities. Normal posture. Muscle wasting Extremities:  Without edema. Thin.  Neurologic:  Alert and  oriented x4 Psych:  Alert and cooperative. Normal mood and affect.  Assessment  FARIS COOLMAN is a 68 y.o. male presenting today with ongoing dysphagia.   Dysphagia, Achalasia, odynophagia: - EGD in 2018 requiring dilation for esophageal ring -Prior hospitalizations/ED visits for dysphagia.  EGD performed earlier this year with complete dilated esophagus and hypertonic sphincter with mild puckering, dilation was performed.  There was moderate amount of food residue within the stomach. -BPE with limited primarily and tertiary peristaltic activity with stasis of contrast bolus within the esophagus.  Esophagus with corkscrew appearance. -Underwent manometry with [facility] recently that confirmed type II achalasia, currently awaiting consultation appointment with Dr. Francois, unfortunately scheduled for November as earliest availability. -After manometry they recommended timed barium esophagram and/or EGD with flip. -We do not have the capabilities to do EGD with flip therefore this needs to be done at [facility]  We attempted repeat timed barium esophagram but procedure was aborted given stasis of barium and there was smooth tapering of the distal third of the esophagus with narrowing to the GE junction. - Currently able to tolerate boost drinks most days and small amounts of soft foods.  Takes medication very well as long as they are crushed in applesauce. - Will continue PPI for now.  Does have some odynophagia for which I will provide some viscous lidocaine  twice a day as needed.  I discussed the plan with his daughter as well as the patient in the office today in detail that if he presents with any further weight loss then we will need to consider  supplemental nutrition with likely long-term NG tube/core track versus PEG tube.  We will continue to work on getting him a sooner appointment with [facility] for definitive treatment of achalasia.  I discussed with him continuing the boost is much as tolerated as well as maintaining nutrition with multivitamins etc.  He will return to the office in about 4 weeks for a weight check.   Anemia: - Most recent labs with hemoglobin stable at 12. - Admits to prior intermittent - no reports of GI bleeding.   PLAN   Encouraged to call [facility] every few days to ask about cancellations for a sooner appointment. (Appt Nov 2025) Continue full liquids with small amounts of solids as able. Continue pantoprazole  daily Continue crushing medications as able Supplement diet with boost 2-3 daily as able. Previously discussed if not able to maintain weight may need to consider alternative feeding route (NG vs PEG tube) Follow up pending [facility] findings and weight Viscous lidocaine  as needed for odynophagia.  Weight check in 4 weeks.  Needs eventual colonoscopy after his upper GI symptoms are improved as he likely would not tolerate prep.    Charmaine Melia, MSN, FNP-BC, AGACNP-BC [facility]

## 2023-08-31 ENCOUNTER — Encounter: Payer: Self-pay | Admitting: Gastroenterology

## 2023-08-31 ENCOUNTER — Ambulatory Visit (INDEPENDENT_AMBULATORY_CARE_PROVIDER_SITE_OTHER): Admitting: Gastroenterology

## 2023-08-31 VITALS — BP 96/67 | HR 64 | Temp 98.5°F | Ht 72.0 in | Wt 126.8 lb

## 2023-08-31 DIAGNOSIS — D649 Anemia, unspecified: Secondary | ICD-10-CM

## 2023-08-31 DIAGNOSIS — K220 Achalasia of cardia: Secondary | ICD-10-CM

## 2023-08-31 DIAGNOSIS — R1310 Dysphagia, unspecified: Secondary | ICD-10-CM | POA: Diagnosis not present

## 2023-08-31 MED ORDER — LIDOCAINE VISCOUS HCL 2 % MT SOLN: 10.0000 mL | Freq: Two times a day (BID) | OROMUCOSAL | 1 refills | Status: AC | PRN

## 2023-08-31 NOTE — Patient Instructions (Addendum)
 Dr. Darin Dufault  [facility]  [address]  [address]  [place], [place] [address]  [phone]    Please come by the office for a nurse visit in about 4 weeks for a weight check.  Continue to eat small amounts of soft foods and/or liquids as much as tolerated.  I do want you to consume 2-3 boost every day as able. If you notice worsening weight loss at home please let me know.  At that point I do think we should pursue an  long-term NG tube with feedings per dietitian versus a gastric tube in your stomach.  Please continue taking your multivitamin and crushing it daily.  If you do not want to continue to crush it then you may try a liquid multivitamin.  As we discussed, I recommend calling every few days to the [facility] clinic to ask about cancellations and a sooner appointment with Dr. Francois.  It was a pleasure to see you today. I want to create trusting relationships with patients. If you receive a survey regarding your visit,  I greatly appreciate you taking time to fill this out on paper or through your MyChart. I value your feedback.  Mike Melia, MSN, FNP-BC, AGACNP-BC [facility]

## 2023-09-01 ENCOUNTER — Ambulatory Visit (HOSPITAL_COMMUNITY)
Admission: RE | Admit: 2023-09-01 | Discharge: 2023-09-01 | Disposition: A | Discharge status: Home / Self Care | Source: Ambulatory Visit | Attending: Adult Health | Admitting: Adult Health

## 2023-09-01 ENCOUNTER — Other Ambulatory Visit (HOSPITAL_COMMUNITY): Payer: Self-pay | Admitting: Adult Health

## 2023-09-01 DIAGNOSIS — M79601 Pain in right arm: Secondary | ICD-10-CM
